# Patient Record
Sex: Male | Born: 1935 | Race: White | Hispanic: No | Smoking: Former smoker
Health system: Southern US, Community
[De-identification: ages and names within clinical notes are randomized; demographics above are authoritative.]

## PROBLEM LIST (undated history)

## (undated) DIAGNOSIS — I639 Cerebral infarction, unspecified: Secondary | ICD-10-CM

## (undated) DIAGNOSIS — I2510 Atherosclerotic heart disease of native coronary artery without angina pectoris: Secondary | ICD-10-CM

## (undated) DIAGNOSIS — F329 Major depressive disorder, single episode, unspecified: Secondary | ICD-10-CM

## (undated) DIAGNOSIS — F32A Depression, unspecified: Secondary | ICD-10-CM

## (undated) DIAGNOSIS — G20 Parkinson's disease: Secondary | ICD-10-CM

## (undated) HISTORY — PX: CORONARY ARTERY BYPASS GRAFT: SHX141

## (undated) HISTORY — DX: Depression, unspecified: F32.A

## (undated) HISTORY — PX: HERNIA REPAIR: SHX51

## (undated) HISTORY — DX: Cerebral infarction, unspecified: I63.9

## (undated) HISTORY — DX: Major depressive disorder, single episode, unspecified: F32.9

## (undated) HISTORY — PX: KNEE SURGERY: SHX244

## (undated) HISTORY — PX: CARPAL TUNNEL RELEASE: SHX101

---

## 1997-11-12 ENCOUNTER — Ambulatory Visit (HOSPITAL_COMMUNITY): Admission: RE | Admit: 1997-11-12 | Discharge: 1997-11-12 | Payer: Self-pay | Admitting: Interventional Cardiology

## 1997-11-18 ENCOUNTER — Inpatient Hospital Stay (HOSPITAL_COMMUNITY): Admission: RE | Admit: 1997-11-18 | Discharge: 1997-11-23 | Payer: Self-pay | Admitting: Cardiothoracic Surgery

## 1997-12-08 ENCOUNTER — Encounter (HOSPITAL_COMMUNITY): Admission: RE | Admit: 1997-12-08 | Discharge: 1998-03-08 | Payer: Self-pay | Admitting: Interventional Cardiology

## 1997-12-23 ENCOUNTER — Encounter: Admission: RE | Admit: 1997-12-23 | Discharge: 1998-03-23 | Payer: Self-pay | Admitting: Cardiothoracic Surgery

## 1998-08-04 ENCOUNTER — Encounter (HOSPITAL_COMMUNITY): Admission: RE | Admit: 1998-08-04 | Discharge: 1998-11-02 | Payer: Self-pay | Admitting: Interventional Cardiology

## 1999-03-22 ENCOUNTER — Ambulatory Visit (HOSPITAL_COMMUNITY): Admission: RE | Admit: 1999-03-22 | Discharge: 1999-03-22 | Payer: Self-pay | Admitting: *Deleted

## 1999-05-16 ENCOUNTER — Encounter (HOSPITAL_COMMUNITY): Admission: RE | Admit: 1999-05-16 | Discharge: 1999-08-14 | Payer: Self-pay | Admitting: Interventional Cardiology

## 2000-03-22 ENCOUNTER — Encounter: Payer: Self-pay | Admitting: Urology

## 2000-03-27 ENCOUNTER — Encounter (INDEPENDENT_AMBULATORY_CARE_PROVIDER_SITE_OTHER): Payer: Self-pay

## 2000-03-27 ENCOUNTER — Inpatient Hospital Stay (HOSPITAL_COMMUNITY): Admission: RE | Admit: 2000-03-27 | Discharge: 2000-03-28 | Payer: Self-pay | Admitting: Urology

## 2001-01-01 ENCOUNTER — Encounter (INDEPENDENT_AMBULATORY_CARE_PROVIDER_SITE_OTHER): Payer: Self-pay

## 2001-01-01 ENCOUNTER — Ambulatory Visit (HOSPITAL_COMMUNITY): Admission: RE | Admit: 2001-01-01 | Discharge: 2001-01-01 | Payer: Self-pay | Admitting: Gastroenterology

## 2001-02-28 HISTORY — PX: CIRCUMCISION: SUR203

## 2007-12-30 ENCOUNTER — Inpatient Hospital Stay (HOSPITAL_BASED_OUTPATIENT_CLINIC_OR_DEPARTMENT_OTHER): Admission: RE | Admit: 2007-12-30 | Discharge: 2007-12-30 | Payer: Self-pay | Admitting: Interventional Cardiology

## 2009-09-09 ENCOUNTER — Ambulatory Visit (HOSPITAL_COMMUNITY): Admission: RE | Admit: 2009-09-09 | Discharge: 2009-09-10 | Payer: Self-pay | Admitting: General Surgery

## 2010-10-20 LAB — BASIC METABOLIC PANEL
BUN: 11 mg/dL (ref 6–23)
CO2: 28 mEq/L (ref 19–32)
Calcium: 10.3 mg/dL (ref 8.4–10.5)
Chloride: 102 mEq/L (ref 96–112)
Creatinine, Ser: 1 mg/dL (ref 0.4–1.5)
GFR calc Af Amer: >60 mL/min (ref >60)
GFR calc non Af Amer: >60 mL/min (ref >60)
Glucose, Bld: 96 mg/dL (ref 70–99)
Potassium: 4.7 mEq/L (ref 3.5–5.1)
Sodium: 136 mEq/L (ref 135–145)

## 2010-10-20 LAB — DIFFERENTIAL
Basophils Absolute: 0 10*3/uL (ref 0.0–0.1)
Basophils Relative: 1 % (ref 0–1)
Eosinophils Absolute: 0.1 10*3/uL (ref 0.0–0.7)
Eosinophils Relative: 2 % (ref 0–5)
Lymphocytes Relative: 27 % (ref 12–46)
Lymphs Abs: 1.9 10*3/uL (ref 0.7–4.0)
Monocytes Absolute: 0.8 10*3/uL (ref 0.1–1.0)
Monocytes Relative: 11 % (ref 3–12)
Neutro Abs: 4.2 10*3/uL (ref 1.7–7.7)
Neutrophils Relative %: 60 % (ref 43–77)

## 2010-10-20 LAB — CBC
HCT: 43.5 % (ref 39.0–52.0)
Hemoglobin: 15 g/dL (ref 13.0–17.0)
MCHC: 34.4 g/dL (ref 30.0–36.0)
MCV: 93.5 fL (ref 78.0–100.0)
Platelets: 207 10*3/uL (ref 150–400)
RBC: 4.65 MIL/uL (ref 4.22–5.81)
RDW: 12.6 % (ref 11.5–15.5)
WBC: 7 10*3/uL (ref 4.0–10.5)

## 2010-12-13 NOTE — Cardiovascular Report (Signed)
Eric Oneill, Eric Oneill NO.:  [id number]   MEDICAL RECORD NO.:  [id number]          PATIENT TYPE:  OIB   LOCATION:  1961                         FACILITY:  [facility]   PHYSICIAN:  Lyn Records, M.D.   DATE OF BIRTH:  July 09, 1928   DATE OF PROCEDURE:  12/30/2007  DATE OF DISCHARGE:  12/30/2007                            CARDIAC CATHETERIZATION   INDICATIONS:  Eric Oneill has a history of coronary artery disease.  He  has been having recent episodes of chest tightness responsive to  nitroglycerin.  The study is being done to define coronary graft  patency.  The patient has a history of prior coronary grafting in 1999  x4 with a saphenous vein graft to the obtuse marginal, saphenous vein  graft to the first diagonal, saphenous vein graft to the second diagonal  and left internal mammary graft to the LAD.   PROCEDURE PERFORMED:  1. Left heart cath.  2. Selective coronary angio.  3. Left ventriculography.  4. Saphenous vein graft angiography.  5. Internal mammary artery grafting.   DESCRIPTION:  After informed consent, a 4-French sheath was placed in  the right femoral artery using modified Seldinger technique.  A 4-French  A2 multipurpose catheter was then used for hemodynamic recordings, left  ventriculography by hand injection, selective saphenous vein graft  angiography and native right coronary angiography.  A #5 4-French left  coronary catheter was used for left coronary angiography.  An internal  mammary artery catheter, 4-French was used for left internal mammary  graft angiography.  The patient tolerated the procedure without  complications.  Labetalol 10 mg IV given to help blood pressure control.   RESULTS:  1. Hemodynamic data:      a.     Aortic pressure 178/50.      b.     Left ventricular pressure 177/17.  2. Left ventriculography:  There is moderate mid anterior wall      hypokinesis.  Ejection fraction is 60%.  3. Coronary angiography.      a.      Left main coronary:  Widely patent.      b.     Left anterior descending coronary:  Highly diseased       throughout the proximal one half of the vessel with total       occlusion on the midvessel.  The first and second septal       perforators were supplied via the LAD through a 60-70%       obstruction.  The third septal perforator is threatened.  This was       relatively small and follows a 90% stenosis.  Beyond this, the       vessel appears to be occluded but may also be a region that is       supplied by the bypass graft that is producing such competitive       flow that it appears to be totally occluded.      c.     Circumflex artery:  Three small obtuse marginal branches       arise from  the proximal mid and distal vessel.  A large fourth and       fifth obtuse marginal arise distally.  The mid segment after the       second obtuse marginal contains 30% narrowing.  The distal       circumflex near the origin of the third obtuse marginal contains       an eccentric 60-70% lesion.  The fourth obtuse marginal is huge       and contains a region that is tented up from prior grafting.       There is 50-60% narrowing in this tented region.  The fifth obtuse       marginal is small but widely patent.      d.     Right coronary:  The right coronary artery is dominant,       heavily calcified and contains a shepherd's crook.  The vessel is       calcified within the shepherd's crook and in the midsegment.  No       significant obstruction is noted.  It gives origin to a large PDA       and moderate-sized left ventricular branches.  The vessel contains       no significant obstruction.  4. Bypass graft angiography.      a.     Saphenous vein graft to the first diagonal:  The graft is       small in caliber, contains proximal and mid irregularities and is       widely patent.      b.     Saphenous vein graft to the second diagonal:  This graft is       also widely patent with proximal one  third irregularities noted.      c.     Saphenous vein graft to the obtuse marginal #4:  Occluded at       the aorto-ostial junction.  5. Left internal mammary graft to the LAD:  This graft is widely      patent.   CONCLUSION:  1. The patient has severe coronary atherosclerosis with functional      occlusion of the mid-LAD and moderate disease in the circumflex.      There is mild-to-moderate right coronary artery disease.  2. Abnormal left ventricular function with mid anterior wall      hypokinesis.  EF is greater than 50%.  No mitral regurgitation.  3. 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  4. Patent saphenous vein graft to the diagonals as noted above and      also patent LIMA to the LAD.   RECOMMENDATIONS:  The patient's myocardial segments are relatively well  protected by patent grafts and/or native vessels that do not contain  high-grade obstruction.  The patient's angina seems as though it should  be manageable with medical therapy.  The one potential site of PCI that  could be helpful would be the distal circumflex, although  angiographically, this lesion does not appear critical.  This territory  beyond the native obstruction, however, does appear to be a region that  is ischemic on the Cardiolite.   PLAN:  We will discuss with the patient and family but pursue medical  therapy for the time being.  If symptoms are not controllable, consider  PCI of distal circumflex.      Lyn Records, M.D.  Electronically Signed  HWS/MEDQ  D:  12/30/2007  T:  12/30/2007  Job:  [id number]   cc:   Juluis Rainier, M.D.  [employer]

## 2010-12-16 NOTE — Procedures (Signed)
[facility]  Patient:    Eric Oneill, Eric Oneill                     MRN: [id number] Proc. Date: 01/01/01 Adm. Date:  [id number] Attending:  Nelda Marseille CC:         Al Decant. [name], M.D.   Procedure Report  PROCEDURE:  Colonoscopy with polypectomy.  ENDOSCOPIST:  Petra Kuba, M.D.  INDICATIONS:  Screening.  Consent was signed after risks, benefits, methods, and options were thoroughly discussed in the office.  MEDICINES USED:  Demerol 75, Versed 8.  DESCRIPTION OF PROCEDURE:  Rectal inspection was pertinent for external hemorrhoids.  Digital exam was negative.  Video colonoscope was inserted and fairly easily despite a lot of left-sided diverticulum.  Able to advance to the cecum.  This required some abdominal pressure but no position changes. The cecum was identified by the appendiceal orifice and the ileocecal valve. Prep was adequate.  There was some liquid stool that required washing and suctioning.  Other than the left-sided diverticula, no other obvious abnormality was seen on insertion.  The scope was slowly withdrawn.  The cecum and the ascending were normal.  In the transverse were small to medium size sessile polyps which were all hot biopsied x 1.  Two were hot biopsied x 1; one was hot biopsied x 2; and one was hot biopsied x 3.  There were all put in the same container.  The scope was slowly withdrawn.  Spots of diverticula were on the left side, but no other polypoid lesions, masses or other abnormalities.  The scope was slowly withdrawn back to the rectum where a small to tiny polyp was seen and hot biopsied x 1 and was put in with the other polyps.  The patient scope was then retroflexed pertinent for some internal hemorrhoids.   The scope was then straightened and readvanced a short ways up the sigmoid.  Air was suctioned and scope removed.  The patient tolerated the procedure well.  There was no obvious immediate  complication.  ENDOSCOPIC DIAGNOSIS: 1. Internal and external hemorrhoids. 2. Left significant diverticula. 3. One rectal tiny to small polyp and transverse small to medium polyps.  All    hot biopsied and put in the same container. 4. Otherwise within normal limits to the cecum.  PLAN:  Await pathology to determine future colonic screening.  GI followup p.r.n..  Ten-day post polypectomy instructions, otherwise return care to Dr. [name] for the customary health care maintenance to include yearly rectals and guaiacs. DD:  01/01/01 TD:  01/01/01 Job: [id number]

## 2010-12-16 NOTE — Discharge Summary (Signed)
[facility]  Patient:    Eric Oneill, Eric Oneill                     MRN: [id number] Adm. Date:  [id number] Disc. Date: [id number] Attending:  Londell Moh CC:         Al Decant. [name], M.D.  Celso Sickle, M.D.   Discharge Summary  ADMITTING DIAGNOSES: 1. Benign prostatic hypertrophy with bladder outlet obstruction. 2. Chronic balanitis.  SECONDARY DIAGNOSES: 1. Hypertension. 2. Coronary artery disease. 3. Hypercholesterolemia.  PRINCIPAL PROCEDURES: 1. Circumcision. 2. Transurethral resection of the prostate done on the day of admission.  HISTORY:  This 77 year old male has had problems with bladder obstruction despite the use of Flomax b.i.d.  The patients nocturia decreased from six times a night to three times a night, but he is not satisfied with his outlet symptoms.  The patient was given multiple options for therapy and decided to undergo a standard TURP.  He also requested a circumcision be done because of chronic balanitis with fusion of the penile skin to the bulbus of the penis.  PAST MEDICAL HISTORY: 1. Hypertension. 2. Coronary artery disease, status post CABG. 3. Elevated cholesterol.  MEDICATIONS ON ADMISSION: 1. Zocor 20 mg daily. 2. Flomax 0.4 b.i.d. 3. Lopressor 12.5 b.i.d. 4. Vitamin A. 5. Calcium. 6. Aspirin which was discontinued prior to surgery.  The patients past medical history, social history, review of systems, and physical examination are well described in his initial history and physical.  HOSPITAL COURSE:  The patient was taken to the operating room after adequate preparation and underwent successful TURP, and circumcision.  The patient had no postoperative problems.  He was ready for discharge on postoperative day #1.  His catheter was removed.  He urinated without difficulty.  He had no problems involving the circumcision.  He was sent home with a prescription for pain medication as well as  antibiotics.  FOLLOW-UP:  He will return for follow-up in two weeks time. DD:  04/04/00 TD:  04/04/00 Job: [id number]

## 2010-12-16 NOTE — H&P (Signed)
[facility]  Patient:    Eric Oneill, Eric Oneill                       MRN: [id number] Adm. Date:  03/27/00 Attending:  Jamison Neighbor, M.D. CC:         Al Decant. [name], M.D.  Celso Sickle, M.D.   History and Physical  ADMITTING DIAGNOSES: 1. Chronic balanitis. 2. Benign prostatic hypertrophy with bladder outlet obstruction. 3. Hypertension. 4. Past history of coronary artery disease.  HISTORY OF PRESENT ILLNESS:  This is a 77 year old male who has had problems with bladder obstruction despite the use of two Flomax daily.  He says his nocturia has decreased from six times a night to three times a night, but the patient wants a more permanent solution.  He has been apprised of his treatment options.  He was told he could consider medical management with the addition of _____ or herbal therapies.  He also knows about minimally invasive procedures such as TUNA, _____, and the interstitial laser.  The patient has elected to undergo TURP.  In addition, he has requested a circumcision done because of problems with chronic balanitis that has not responded to Mycolog cream.  The patient understands the risks and benefits of the procedures and will be admitted following those procedures.  PAST MEDICAL HISTORY:  Remarkable for hypertension.  He has a history of coronary artery bypass graft.  He is also known to have elevated cholesterol.  MEDICATIONS:  Zocor 20 mg daily, Flomax 0.4 mg b.i.d., Lopressor 12.5 mg b.i.d., vitamin A, calcium, and aspirin.  The aspirin has been discontinued.  PAST SURGICAL HISTORY:  Knee surgery (open and arthroscopy), left shoulder surgery, left inguinal hernia repair, right carpal tunnel surgery, CABG x 4.  SOCIAL HISTORY:  The patient drinks minimal amounts of alcohol.   He does not use tobacco.  REVIEW OF SYSTEMS:  Noncontributory.  Aside from hypertension, he has no coronary problems.  He denies pulmonary,  neurologic, hematologic, endocrine, musculoskeletal conditions.  As noted above, he does have current problems with balanitis.  He also has a past history of constipation and hemorrhoids.  PHYSICAL EXAMINATION:  GENERAL:  He is a well-developed, well-nourished male in no distress.  WEIGHT:  Current weight 235.  Temperature 96.9, pulse 70, respirations 16, blood pressure 130/84.  HEENT:  Normocephalic, atraumatic.  Cranial nerves II-XII are grossly intact.  NECK:  Supple with no adenopathy, thyromegaly, or bruits.  LUNGS:  Clear.  HEART:  Regular rate and rhythm without murmurs, thrills, gallops, rubs, or heaves.  ABDOMEN:  Soft, nontender, no palpable masses, rebound, or guarding.  RECTAL:  A 3-4+ benign-appearing prostate.  GENITOURINARY:  Normal testicles bilaterally.  There is no hydrocele, spermatocele, varicocele, hernia, or adenopathy.  The penis is remarkable for a somewhat adherent foreskin with chronic irritation which has improved somewhat with Mycolog.  EXTREMITIES:  No clubbing, cyanosis, or edema.  NEUROLOGIC:  Grossly intact.  IMPRESSION: 1. Balanitis. 2. Benign prostatic hypertrophy with bladder outlet obstruction.  PLAN:  Admit following circumcision and TURP. 1. Balanitis. DD:  03/27/00 TD:  03/27/00 Job: [id number]

## 2011-04-29 ENCOUNTER — Encounter: Payer: Self-pay | Admitting: Emergency Medicine

## 2011-04-29 ENCOUNTER — Emergency Department (HOSPITAL_BASED_OUTPATIENT_CLINIC_OR_DEPARTMENT_OTHER)
Admission: EM | Admit: 2011-04-29 | Discharge: 2011-04-29 | Disposition: A | Discharge status: Home / Self Care | Payer: Medicare Other | Attending: Emergency Medicine | Admitting: Emergency Medicine

## 2011-04-29 DIAGNOSIS — I2510 Atherosclerotic heart disease of native coronary artery without angina pectoris: Secondary | ICD-10-CM | POA: Insufficient documentation

## 2011-04-29 DIAGNOSIS — G20 Parkinson's disease: Secondary | ICD-10-CM | POA: Insufficient documentation

## 2011-04-29 DIAGNOSIS — G20A1 Parkinson's disease without dyskinesia, without mention of fluctuations: Secondary | ICD-10-CM | POA: Insufficient documentation

## 2011-04-29 DIAGNOSIS — H5789 Other specified disorders of eye and adnexa: Secondary | ICD-10-CM | POA: Insufficient documentation

## 2011-04-29 DIAGNOSIS — Z951 Presence of aortocoronary bypass graft: Secondary | ICD-10-CM | POA: Insufficient documentation

## 2011-04-29 DIAGNOSIS — T7840XA Allergy, unspecified, initial encounter: Secondary | ICD-10-CM

## 2011-04-29 DIAGNOSIS — Z79899 Other long term (current) drug therapy: Secondary | ICD-10-CM | POA: Insufficient documentation

## 2011-04-29 DIAGNOSIS — R600 Localized edema: Secondary | ICD-10-CM

## 2011-04-29 HISTORY — DX: Atherosclerotic heart disease of native coronary artery without angina pectoris: I25.10

## 2011-04-29 HISTORY — DX: Parkinson's disease: G20

## 2011-04-29 MED ORDER — HYDROXYZINE HCL 25 MG PO TABS: 25.0000 mg | ORAL_TABLET | Freq: Four times a day (QID) | ORAL | Status: AC

## 2011-04-29 MED ORDER — PREDNISONE 50 MG PO TABS: 50.0000 mg | ORAL_TABLET | Freq: Every day | ORAL | Status: AC

## 2011-04-29 NOTE — ED Provider Notes (Signed)
History     CSN: [id number] Arrival date & time: 04/29/2011  2:05 PM  Chief Complaint  Patient presents with  . Facial Swelling    facial swelling      (Consider location/radiation/quality/duration/timing/severity/associated sxs/prior treatment) HPI Comments: Patient presents with swelling below his left eye for the past 2 days. He also has an itchy rash to his bilateral hands that he thinks is poison ivy. Patient's daughter states she gave him some Benadryl cream for GI yesterday he woke up this morning the swelling was worse. He denies any change of his vision, headache, pain with eye movement, blurry vision or double vision. He denies any fever, shortness of breath or wheezing. Using over-the-counter poison ivy products on his hands as well as the back of his neck. He denies any chest pain, difficulty breathing, abdominal pain, vomiting or diarrhea. There's been no trauma to the eye or orbit.  The history is provided by the patient and a relative.    Past Medical History  Diagnosis Date  . Coronary artery disease   . Parkinson disease     Past Surgical History  Procedure Date  . Coronary artery bypass graft     History reviewed. No pertinent family history.  History  Substance Use Topics  . Smoking status: Never Smoker   . Smokeless tobacco: Not on file  . Alcohol Use: No      Review of Systems  Constitutional: Negative for activity change and appetite change.  HENT: Negative for congestion and rhinorrhea.   Eyes: Positive for itching. Negative for pain, discharge and visual disturbance.  Respiratory: Negative for cough, chest tightness, shortness of breath and wheezing.   Cardiovascular: Negative for chest pain.  Gastrointestinal: Negative for nausea, vomiting and abdominal pain.  Genitourinary: Negative for dysuria and hematuria.  Skin: Positive for rash.  Neurological: Negative for headaches.    Allergies  Ivp dye  Home Medications   Current Outpatient Rx   Name Route Sig Dispense Refill  . ACETAMINOPHEN 500 MG PO TABS Oral Take 1,000 mg by mouth once as needed. For pain     . ASPIRIN 81 MG PO TABS Oral Take 81 mg by mouth daily.      Marland Kitchen CARBIDOPA-LEVODOPA 50-200 MG PO TBCR Oral Take 1 tablet by mouth 3 (three) times daily.      Marland Kitchen CARVEDILOL 12.5 MG PO TABS Oral Take 12.5 mg by mouth 2 (two) times daily.     Marland Kitchen DIPHENHYDRAMINE-ZINC ACETATE 1-0.1 % EX CREA Topical Apply 1 application topically 2 (two) times daily as needed. For poison ivy     . OMEGA-3 FATTY ACIDS 1000 MG PO CAPS Oral Take 1 g by mouth daily.      . TECNU EXTREME MED POISON IVY EX GEL Apply externally Apply 1 application topically 2 (two) times daily as needed. For poison ivy     . LISINOPRIL 20 MG PO TABS Oral Take 20 mg by mouth daily.      . MULTI-VITAMIN/MINERALS PO TABS Oral Take 1 tablet by mouth daily.      Marland Kitchen SIMVASTATIN 40 MG PO TABS Oral Take 40 mg by mouth at bedtime.      Marland Kitchen HYDROXYZINE HCL 25 MG PO TABS Oral Take 1 tablet (25 mg total) by mouth every 6 (six) hours. 12 tablet 0  . PREDNISONE 50 MG PO TABS Oral Take 1 tablet (50 mg total) by mouth daily. 5 tablet 0    BP 148/58  Pulse 55  Temp(Src) 98  F (36.7 C) (Oral)  Resp 18  SpO2 100%  Physical Exam  Constitutional: He is oriented to person, place, and time. He appears well-developed and well-nourished. No distress.  HENT:  Head: Normocephalic and atraumatic.  Mouth/Throat: Oropharynx is clear and moist. No oropharyngeal exudate.  Eyes: Conjunctivae and EOM are normal. Pupils are equal, round, and reactive to light.       Swelling at the inferior orbital rim on the left. Nontender to palpation. Normal extraocular movements, normal conjunctiva, no scleral injection or discharge.  Neck: Normal range of motion. Neck supple.  Cardiovascular: Normal rate, regular rhythm and normal heart sounds.   Pulmonary/Chest: Effort normal and breath sounds normal. No respiratory distress.  Abdominal: Soft. There is no  tenderness. There is no rebound and no guarding.  Musculoskeletal: Normal range of motion. He exhibits no edema and no tenderness.  Neurological: He is alert and oriented to person, place, and time. No cranial nerve deficit.  Skin: Skin is warm. Rash noted.        Erythematous vesicular rash to left thenar eminence consistent with poison ivy    ED Course  Procedures (including critical care time)  Labs Reviewed - No data to display No results found.   1. Periorbital edema   2. Allergic reaction       MDM  Periorbital swelling without trauma. This appears to be consistent with allergic reaction. Patient likely was exposed to poison ivy and then touched his face. He has no visual change or systemic symptoms. He has no pain or other suggestion of trauma. We'll treat his symptoms with steroids and antihistamines.  He will need to follow up with his doctor this week. Patient and daughter express understanding.      Glynn Octave, MD 04/29/11 [phone]

## 2011-04-29 NOTE — ED Notes (Signed)
Pt presents with swelling to L side of Orbit denies any trauma

## 2012-08-05 ENCOUNTER — Ambulatory Visit: Payer: Medicare Other | Attending: Family Medicine

## 2012-08-05 DIAGNOSIS — M6281 Muscle weakness (generalized): Secondary | ICD-10-CM | POA: Insufficient documentation

## 2012-08-05 DIAGNOSIS — M2569 Stiffness of other specified joint, not elsewhere classified: Secondary | ICD-10-CM | POA: Insufficient documentation

## 2012-08-05 DIAGNOSIS — M79609 Pain in unspecified limb: Secondary | ICD-10-CM | POA: Insufficient documentation

## 2012-08-05 DIAGNOSIS — G20 Parkinson's disease: Secondary | ICD-10-CM | POA: Insufficient documentation

## 2012-08-05 DIAGNOSIS — IMO0001 Reserved for inherently not codable concepts without codable children: Secondary | ICD-10-CM | POA: Insufficient documentation

## 2012-08-05 DIAGNOSIS — M545 Low back pain, unspecified: Secondary | ICD-10-CM | POA: Insufficient documentation

## 2012-08-05 DIAGNOSIS — R262 Difficulty in walking, not elsewhere classified: Secondary | ICD-10-CM | POA: Insufficient documentation

## 2012-08-05 DIAGNOSIS — G20A1 Parkinson's disease without dyskinesia, without mention of fluctuations: Secondary | ICD-10-CM | POA: Insufficient documentation

## 2012-08-08 ENCOUNTER — Ambulatory Visit: Payer: Medicare Other

## 2012-08-12 ENCOUNTER — Ambulatory Visit: Payer: Medicare Other

## 2012-08-19 ENCOUNTER — Ambulatory Visit: Payer: Medicare Other | Admitting: Physical Therapy

## 2012-08-26 ENCOUNTER — Ambulatory Visit: Payer: Medicare Other | Admitting: Physical Therapy

## 2012-09-02 ENCOUNTER — Ambulatory Visit: Payer: Medicare Other | Admitting: Physical Therapy

## 2012-09-03 ENCOUNTER — Ambulatory Visit: Payer: Medicare Other | Admitting: Physical Therapy

## 2012-09-10 ENCOUNTER — Ambulatory Visit: Payer: Medicare Other | Attending: Family Medicine

## 2012-09-10 DIAGNOSIS — M545 Low back pain, unspecified: Secondary | ICD-10-CM | POA: Insufficient documentation

## 2012-09-10 DIAGNOSIS — R262 Difficulty in walking, not elsewhere classified: Secondary | ICD-10-CM | POA: Insufficient documentation

## 2012-09-10 DIAGNOSIS — M25569 Pain in unspecified knee: Secondary | ICD-10-CM | POA: Insufficient documentation

## 2012-09-10 DIAGNOSIS — M6281 Muscle weakness (generalized): Secondary | ICD-10-CM | POA: Insufficient documentation

## 2012-09-10 DIAGNOSIS — IMO0001 Reserved for inherently not codable concepts without codable children: Secondary | ICD-10-CM | POA: Insufficient documentation

## 2012-09-17 ENCOUNTER — Ambulatory Visit: Payer: Medicare Other | Admitting: Physical Therapy

## 2012-09-24 ENCOUNTER — Ambulatory Visit: Payer: Medicare Other | Admitting: Physical Therapy

## 2012-10-01 ENCOUNTER — Ambulatory Visit: Payer: Medicare Other | Attending: Family Medicine | Admitting: Physical Therapy

## 2012-10-01 DIAGNOSIS — M6281 Muscle weakness (generalized): Secondary | ICD-10-CM | POA: Insufficient documentation

## 2012-10-01 DIAGNOSIS — IMO0001 Reserved for inherently not codable concepts without codable children: Secondary | ICD-10-CM | POA: Insufficient documentation

## 2012-10-01 DIAGNOSIS — M545 Low back pain, unspecified: Secondary | ICD-10-CM | POA: Insufficient documentation

## 2012-10-01 DIAGNOSIS — R262 Difficulty in walking, not elsewhere classified: Secondary | ICD-10-CM | POA: Insufficient documentation

## 2012-10-01 DIAGNOSIS — M25569 Pain in unspecified knee: Secondary | ICD-10-CM | POA: Insufficient documentation

## 2012-10-15 ENCOUNTER — Ambulatory Visit: Payer: Medicare Other | Admitting: Physical Therapy

## 2012-12-02 ENCOUNTER — Ambulatory Visit: Payer: Medicare Other | Admitting: Physical Therapy

## 2012-12-03 ENCOUNTER — Ambulatory Visit: Payer: Medicare Other | Admitting: Physical Therapy

## 2012-12-05 ENCOUNTER — Telehealth: Payer: Self-pay | Admitting: Neurology

## 2012-12-05 MED ORDER — CARBIDOPA-LEVODOPA 25-100 MG PO TABS: 1.0000 | ORAL_TABLET | Freq: Three times a day (TID) | ORAL | Status: DC

## 2012-12-05 NOTE — Telephone Encounter (Signed)
Patient left message that he has been out of his Carbidopa-Levodopa 25-100 for about a week, he ran out.  But also he said the the pharmacy messed up and he hasn't received it by mail.  He wanted to know if we could arrange to have a week or two sent to the [facility] on [address].  He asked to be called back about this at [phone].  He has an appointment with Eber Jones on 01-13-13.

## 2012-12-09 ENCOUNTER — Telehealth: Payer: Self-pay | Admitting: Neurology

## 2012-12-09 ENCOUNTER — Ambulatory Visit: Payer: Medicare Other | Admitting: Occupational Therapy

## 2012-12-09 ENCOUNTER — Ambulatory Visit: Payer: Medicare Other | Attending: Family Medicine

## 2012-12-09 ENCOUNTER — Telehealth: Payer: Self-pay | Admitting: Nurse Practitioner

## 2012-12-09 ENCOUNTER — Ambulatory Visit: Payer: Medicare Other | Admitting: Physical Therapy

## 2012-12-09 DIAGNOSIS — IMO0001 Reserved for inherently not codable concepts without codable children: Secondary | ICD-10-CM | POA: Insufficient documentation

## 2012-12-09 DIAGNOSIS — M25569 Pain in unspecified knee: Secondary | ICD-10-CM | POA: Insufficient documentation

## 2012-12-09 DIAGNOSIS — R262 Difficulty in walking, not elsewhere classified: Secondary | ICD-10-CM | POA: Insufficient documentation

## 2012-12-09 DIAGNOSIS — M545 Low back pain, unspecified: Secondary | ICD-10-CM | POA: Insufficient documentation

## 2012-12-09 DIAGNOSIS — M6281 Muscle weakness (generalized): Secondary | ICD-10-CM | POA: Insufficient documentation

## 2012-12-09 NOTE — Telephone Encounter (Signed)
This is something that will have to be addressed by either the nurse or provider, as I am unable to advise patient on dose changes in medication.  I am unsure if patient needs to taper dose since he was off of his meds for one week.  I checked with nurse and they would like the provider to address this.  Dr Terrace Arabia, Please advise.  Thank you.

## 2012-12-11 ENCOUNTER — Ambulatory Visit: Payer: Medicare Other | Admitting: Physical Therapy

## 2012-12-11 ENCOUNTER — Ambulatory Visit: Payer: Medicare Other | Admitting: Speech Pathology

## 2012-12-11 NOTE — Telephone Encounter (Signed)
I have called his daughter, he had one week with out sinemet, it is [facility] for him to start Sinemet 25/100 tid.

## 2012-12-16 ENCOUNTER — Ambulatory Visit: Payer: Medicare Other | Admitting: Occupational Therapy

## 2012-12-18 ENCOUNTER — Ambulatory Visit: Payer: Self-pay | Admitting: Nurse Practitioner

## 2012-12-19 ENCOUNTER — Ambulatory Visit: Payer: Medicare Other | Admitting: Occupational Therapy

## 2012-12-19 ENCOUNTER — Ambulatory Visit: Payer: Medicare Other | Admitting: Physical Therapy

## 2012-12-25 ENCOUNTER — Ambulatory Visit: Payer: Medicare Other | Admitting: Occupational Therapy

## 2012-12-25 ENCOUNTER — Other Ambulatory Visit (HOSPITAL_COMMUNITY): Payer: Self-pay | Admitting: Family Medicine

## 2012-12-25 ENCOUNTER — Ambulatory Visit: Payer: Medicare Other | Admitting: Speech Pathology

## 2012-12-25 DIAGNOSIS — R1310 Dysphagia, unspecified: Secondary | ICD-10-CM

## 2012-12-26 ENCOUNTER — Ambulatory Visit: Payer: Medicare Other

## 2012-12-30 ENCOUNTER — Ambulatory Visit: Payer: PRIVATE HEALTH INSURANCE | Admitting: Speech Pathology

## 2012-12-30 ENCOUNTER — Ambulatory Visit: Payer: PRIVATE HEALTH INSURANCE | Attending: Family Medicine | Admitting: Physical Therapy

## 2012-12-30 DIAGNOSIS — M25569 Pain in unspecified knee: Secondary | ICD-10-CM | POA: Insufficient documentation

## 2012-12-30 DIAGNOSIS — M545 Low back pain, unspecified: Secondary | ICD-10-CM | POA: Insufficient documentation

## 2012-12-30 DIAGNOSIS — R262 Difficulty in walking, not elsewhere classified: Secondary | ICD-10-CM | POA: Insufficient documentation

## 2012-12-30 DIAGNOSIS — M6281 Muscle weakness (generalized): Secondary | ICD-10-CM | POA: Insufficient documentation

## 2012-12-30 DIAGNOSIS — IMO0001 Reserved for inherently not codable concepts without codable children: Secondary | ICD-10-CM | POA: Insufficient documentation

## 2012-12-31 ENCOUNTER — Ambulatory Visit (HOSPITAL_COMMUNITY)
Admission: RE | Admit: 2012-12-31 | Discharge: 2012-12-31 | Disposition: A | Discharge status: Home / Self Care | Payer: PRIVATE HEALTH INSURANCE | Source: Ambulatory Visit | Attending: Family Medicine | Admitting: Family Medicine

## 2012-12-31 DIAGNOSIS — R1310 Dysphagia, unspecified: Secondary | ICD-10-CM

## 2012-12-31 DIAGNOSIS — K117 Disturbances of salivary secretion: Secondary | ICD-10-CM | POA: Insufficient documentation

## 2012-12-31 DIAGNOSIS — G20A1 Parkinson's disease without dyskinesia, without mention of fluctuations: Secondary | ICD-10-CM | POA: Insufficient documentation

## 2012-12-31 DIAGNOSIS — G20 Parkinson's disease: Secondary | ICD-10-CM | POA: Insufficient documentation

## 2012-12-31 DIAGNOSIS — R1312 Dysphagia, oropharyngeal phase: Secondary | ICD-10-CM | POA: Insufficient documentation

## 2012-12-31 NOTE — Procedures (Addendum)
Objective Swallowing Evaluation: Modified Barium Swallowing Study  Patient Details  Name: Eric Oneill MRN: [id number] Date of Birth: 01/02/1928  Today's Date: 12/31/2012 Time: 1101-1141 SLP Time Calculation (min): 40 min  Past Medical History:  Past Medical History  Diagnosis Date  . Coronary artery disease   . Parkinson disease    Past Surgical History:  Past Surgical History  Procedure Laterality Date  . Coronary artery bypass graft     HPI:  79 yo male referred by outpt SLP for MBS.  Pt PMH + for Parkinson's disease diagnosed in 07/2007- first detected 07/2006 per pt, CAD s/p CABG 1999, hernia surgery 97 and 2011.  Pt is referred secondary to occasional complaint of choking with intake over the last 8-10 months and vocal weakness x5-6 months.  Pt denies weight loss and pulmonary infections.  He reports he has never required heimlich manuever but did have to "hock up" meat in a restaurant approx 4 months ago.  Xerostomia is reported as well.       Assessment / Plan / Recommendation Clinical Impression  Dysphagia Diagnosis: Mild oral phase dysphagia;Mild pharyngeal phase dysphagia;Mild cervical esophageal phase dysphagia  Clinical impression: Mild oropharyngeal dysphagia present without aspiration of any consistency tested.  Swallow dysfunction characterized by decreased strength in oral transiting and decreased pharyngeal musculature contraction resulting in pharyngeal stasis.  Reflexive swallows aid clearance of residuals and pt admits to conducting this strategy for approx 6-8 months.   Trace laryngeal penetration of thin noted x1 of approx 10 boluses, pt did not penetrate or aspirate when tested with sequential bolus consumption of thins.  Rec pt continue his diet with strict aspiration precautions and initiate exercises to maximize his swallow potential.  Of note, when SLP inquired if pt was sitting upright and taking small bites/sips when his coughing occurs, he admitted likely  not.  Skilled SLP intervention included advising pt and son-in-law to compensatory strategies, diet recommendations and need for family to know heimlich manuever for emergent use.   Suspect pt's coughing with intake is due to episodic aspiration based on today's MBS and SLP encouraged strict use of strategies to mitigate risk.      Pt denies having weight loss nor pulmonary infections, therefore appears to be tolerating diet currently, however if he should become acutely ill and functional reserve is compromised, tolerance may be impacted.      Treatment Recommendation  Defer treatment plan to SLP at (Comment)    Diet Recommendation Dysphagia 3 (Mechanical Soft), Ground meat with extra gravy and sauces;Thin liquid   Liquid Administration via: Cup Medication Administration: Whole meds with puree (start with liquid, follow with liquid) Supervision: Patient able to self feed Compensations: Slow rate;Small sips/bites;Follow solids with liquid;Multiple dry swallows after each bite/sip Postural Changes and/or Swallow Maneuvers: Seated upright 90 degrees;Upright 30-60 min after meal, Start meal with liquids due to xerostomia    Other  Recommendations Oral Care Recommendations: Oral care BID   Follow Up Recommendations  Outpatient SLP           SLP Swallow Goals     General HPI: 79 yo male referred by outpt SLP for MBS.  Pt PMH + for Parkinson's disease diagnosed in 07/2007- first detected 07/2006 per pt, CAD s/p CABG 1999, hernia surgery 97 and 2011.  Pt is referred secondary to occasional complaint of choking with intake over the last 8-10 months and vocal weakness x5-6 months.  Pt denies weight loss and pulmonary infections.  He reports he has  never required heimlich manuever but did have to "hock up" meat in a restaurant approx 4 months ago.  Xerostomia is reported as well.   Type of Study: Modified Barium Swallowing Study Reason for Referral: Objectively evaluate swallowing function;Other  (comment) (concern pt may be aspirating) Previous Swallow Assessment: no prior instrumental eval per pt Diet Prior to this Study: Regular;Thin liquids Temperature Spikes Noted: No Respiratory Status: Room air History of Recent Intubation: No Behavior/Cognition: Alert;Cooperative;Pleasant mood Oral Cavity - Dentition: Adequate natural dentition Oral Motor / Sensory Function: Impaired motor Oral impairment: Left lingual (slight deviation left upon protrusion) Self-Feeding Abilities: Able to feed self Patient Positioning: Upright in chair Baseline Vocal Quality: Low vocal intensity;Clear Volitional Cough: Strong Volitional Swallow: Able to elicit Anatomy: Within functional limits Pharyngeal Secretions: Not observed secondary MBS    Reason for Referral Objectively evaluate swallowing function;Other (comment) (concern pt may be aspirating)   Oral Phase Oral Preparation/Oral Phase Oral Phase: Impaired Oral - Nectar Oral - Nectar Cup: Delayed oral transit;Reduced posterior propulsion Oral - Thin Oral - Thin Cup: Delayed oral transit;Reduced posterior propulsion Oral - Thin Straw: Delayed oral transit;Reduced posterior propulsion Oral - Solids Oral - Puree: Reduced posterior propulsion;Delayed oral transit Oral - Regular: Delayed oral transit;Reduced posterior propulsion Oral - Pill: Within functional limits (with pudding) Oral Phase - Comment Oral Phase - Comment: mild amount of lingual pumping observed   Pharyngeal Phase Pharyngeal Phase Pharyngeal Phase: Impaired Pharyngeal - Nectar Pharyngeal - Nectar Cup: Reduced airway/laryngeal closure;Reduced laryngeal elevation;Reduced epiglottic inversion;Reduced pharyngeal peristalsis;Pharyngeal residue - valleculae;Pharyngeal residue - pyriform sinuses;Reduced tongue base retraction Pharyngeal - Thin Pharyngeal - Thin Cup: Reduced airway/laryngeal closure;Reduced laryngeal elevation;Reduced epiglottic inversion;Reduced pharyngeal  peristalsis;Reduced tongue base retraction;Penetration/Aspiration during swallow Penetration/Aspiration details (thin cup): Material enters airway, remains ABOVE vocal cords and not ejected out Pharyngeal - Thin Straw: Reduced epiglottic inversion;Reduced laryngeal elevation;Reduced airway/laryngeal closure;Reduced tongue base retraction Penetration/Aspiration details (thin straw): Material does not enter airway Pharyngeal - Solids Pharyngeal - Puree: Premature spillage to valleculae;Pharyngeal residue - valleculae;Pharyngeal residue - pyriform sinuses;Reduced tongue base retraction;Reduced pharyngeal peristalsis;Reduced epiglottic inversion Pharyngeal - Regular: Premature spillage to valleculae;Pharyngeal residue - valleculae;Pharyngeal residue - pyriform sinuses;Reduced tongue base retraction;Reduced pharyngeal peristalsis;Reduced epiglottic inversion (chin tuck not helpful to decr stasis) Pharyngeal - Pill: Premature spillage to valleculae (taken with pudding) Pharyngeal Phase - Comment Pharyngeal Comment: pt with reflexive dry swallows to aid clearance of liquids, following solids with liquid helpful to decrease stasis  Cervical Esophageal Phase    GO    Cervical Esophageal Phase Cervical Esophageal Phase: Impaired Cervical Esophageal Phase - Nectar Nectar Cup: Reduced cricopharyngeal relaxation Cervical Esophageal Phase - Thin Thin Cup: Reduced cricopharyngeal relaxation Thin Straw: Reduced cricopharyngeal relaxation Cervical Esophageal Phase - Solids Puree: Reduced cricopharyngeal relaxation Regular: Reduced cricopharyngeal relaxation Pill: Reduced cricopharyngeal relaxation Cervical Esophageal Phase - Comment Cervical Esophageal Comment: Barium tablet given with pudding appeared to lodge at mid-esophagus without pt awareness, further boluses of thin and pudding did not clear tablet, providing pt with water aided clearance.  Pt did state at times he senses pills lodging in his  esophagus requiring him to drink more water to clear.     Functional Assessment Tool Used: mbs, slp clinical judgement Functional Limitations: Swallowing Swallow Current Status (Z6109): At least 20 percent but less than 40 percent impaired, limited or restricted Swallow Goal Status [phone]): At least 20 percent but less than 40 percent impaired, limited or restricted Swallow Discharge Status [phone]): At least 20 percent but less than 40 percent impaired,  limited or restricted    Donavan Burnet, MS [facility] SLP [phone]

## 2013-01-01 ENCOUNTER — Ambulatory Visit: Payer: PRIVATE HEALTH INSURANCE | Admitting: Speech Pathology

## 2013-01-01 ENCOUNTER — Ambulatory Visit: Payer: PRIVATE HEALTH INSURANCE | Admitting: Occupational Therapy

## 2013-01-01 ENCOUNTER — Ambulatory Visit: Payer: PRIVATE HEALTH INSURANCE | Admitting: Physical Therapy

## 2013-01-06 ENCOUNTER — Ambulatory Visit: Payer: PRIVATE HEALTH INSURANCE | Admitting: Physical Therapy

## 2013-01-06 ENCOUNTER — Ambulatory Visit: Payer: PRIVATE HEALTH INSURANCE | Admitting: Occupational Therapy

## 2013-01-07 ENCOUNTER — Ambulatory Visit: Payer: PRIVATE HEALTH INSURANCE

## 2013-01-09 ENCOUNTER — Ambulatory Visit: Payer: PRIVATE HEALTH INSURANCE

## 2013-01-09 ENCOUNTER — Ambulatory Visit: Payer: PRIVATE HEALTH INSURANCE | Admitting: Physical Therapy

## 2013-01-09 ENCOUNTER — Ambulatory Visit: Payer: PRIVATE HEALTH INSURANCE | Admitting: Occupational Therapy

## 2013-01-13 ENCOUNTER — Ambulatory Visit: Payer: PRIVATE HEALTH INSURANCE | Admitting: Physical Therapy

## 2013-01-13 ENCOUNTER — Ambulatory Visit: Payer: Self-pay | Admitting: Nurse Practitioner

## 2013-01-13 ENCOUNTER — Ambulatory Visit: Payer: PRIVATE HEALTH INSURANCE | Admitting: Speech Pathology

## 2013-01-13 ENCOUNTER — Ambulatory Visit: Payer: PRIVATE HEALTH INSURANCE | Admitting: Occupational Therapy

## 2013-01-15 ENCOUNTER — Ambulatory Visit: Payer: PRIVATE HEALTH INSURANCE | Admitting: Occupational Therapy

## 2013-01-15 ENCOUNTER — Ambulatory Visit: Payer: PRIVATE HEALTH INSURANCE | Admitting: Speech Pathology

## 2013-01-15 ENCOUNTER — Ambulatory Visit: Payer: PRIVATE HEALTH INSURANCE | Admitting: Physical Therapy

## 2013-01-20 ENCOUNTER — Ambulatory Visit: Payer: PRIVATE HEALTH INSURANCE | Admitting: Physical Therapy

## 2013-01-20 ENCOUNTER — Ambulatory Visit: Payer: PRIVATE HEALTH INSURANCE | Admitting: Speech Pathology

## 2013-01-22 ENCOUNTER — Ambulatory Visit: Payer: PRIVATE HEALTH INSURANCE | Admitting: Occupational Therapy

## 2013-01-22 ENCOUNTER — Ambulatory Visit: Payer: PRIVATE HEALTH INSURANCE | Admitting: Speech Pathology

## 2013-01-22 ENCOUNTER — Ambulatory Visit: Payer: PRIVATE HEALTH INSURANCE | Admitting: Physical Therapy

## 2013-01-27 ENCOUNTER — Ambulatory Visit: Payer: PRIVATE HEALTH INSURANCE | Admitting: Physical Therapy

## 2013-01-27 ENCOUNTER — Ambulatory Visit: Payer: PRIVATE HEALTH INSURANCE | Admitting: Occupational Therapy

## 2013-01-29 ENCOUNTER — Ambulatory Visit: Payer: Medicare Other | Admitting: Occupational Therapy

## 2013-01-29 ENCOUNTER — Ambulatory Visit: Payer: Medicare Other | Attending: Family Medicine | Admitting: Physical Therapy

## 2013-01-29 DIAGNOSIS — M25569 Pain in unspecified knee: Secondary | ICD-10-CM | POA: Insufficient documentation

## 2013-01-29 DIAGNOSIS — IMO0001 Reserved for inherently not codable concepts without codable children: Secondary | ICD-10-CM | POA: Insufficient documentation

## 2013-01-29 DIAGNOSIS — M545 Low back pain, unspecified: Secondary | ICD-10-CM | POA: Insufficient documentation

## 2013-01-29 DIAGNOSIS — M6281 Muscle weakness (generalized): Secondary | ICD-10-CM | POA: Insufficient documentation

## 2013-01-29 DIAGNOSIS — R262 Difficulty in walking, not elsewhere classified: Secondary | ICD-10-CM | POA: Insufficient documentation

## 2013-02-05 ENCOUNTER — Ambulatory Visit: Payer: Medicare Other | Admitting: Occupational Therapy

## 2013-02-05 ENCOUNTER — Ambulatory Visit: Payer: Medicare Other | Admitting: Physical Therapy

## 2013-02-06 ENCOUNTER — Ambulatory Visit: Payer: Medicare Other | Admitting: Occupational Therapy

## 2013-02-06 ENCOUNTER — Ambulatory Visit: Payer: Medicare Other | Admitting: Physical Therapy

## 2013-02-10 ENCOUNTER — Ambulatory Visit: Payer: Medicare Other | Admitting: Occupational Therapy

## 2013-02-12 ENCOUNTER — Ambulatory Visit: Payer: Medicare Other | Admitting: Occupational Therapy

## 2013-02-18 ENCOUNTER — Ambulatory Visit (INDEPENDENT_AMBULATORY_CARE_PROVIDER_SITE_OTHER): Payer: Medicare Other | Admitting: Neurology

## 2013-02-18 ENCOUNTER — Encounter: Payer: Self-pay | Admitting: Neurology

## 2013-02-18 VITALS — BP 110/62 | HR 68 | Temp 97.6°F | Resp 16 | Wt 188.0 lb

## 2013-02-18 DIAGNOSIS — R296 Repeated falls: Secondary | ICD-10-CM

## 2013-02-18 DIAGNOSIS — M545 Low back pain: Secondary | ICD-10-CM

## 2013-02-18 DIAGNOSIS — R42 Dizziness and giddiness: Secondary | ICD-10-CM

## 2013-02-18 DIAGNOSIS — R292 Abnormal reflex: Secondary | ICD-10-CM

## 2013-02-18 DIAGNOSIS — G20 Parkinson's disease: Secondary | ICD-10-CM

## 2013-02-18 DIAGNOSIS — R413 Other amnesia: Secondary | ICD-10-CM

## 2013-02-18 DIAGNOSIS — Z9181 History of falling: Secondary | ICD-10-CM

## 2013-02-18 MED ORDER — CARBIDOPA-LEVODOPA ER 50-200 MG PO TBCR: 1.0000 | EXTENDED_RELEASE_TABLET | Freq: Every day | ORAL | Status: DC

## 2013-02-18 MED ORDER — CARBIDOPA-LEVODOPA 25-100 MG PO TABS: 1.5000 | ORAL_TABLET | Freq: Three times a day (TID) | ORAL | Status: DC

## 2013-02-18 NOTE — Progress Notes (Signed)
Eric Oneill was seen today in the movement disorders clinic for neurologic consultation at the request of [name], MD.  The consultation is for the evaluation of Parkinson's disease.  The patient previously saw [name].  I last visit with [name] was in June, 2013.  It appears that the patient was seen by [name] on a yearly basis.  I have a copy of the June, 2013 note from [name] and I did review this.  The pt supplies his own hx.    The first symptom(s) the patient noticed was tremor in the L hand.  He was dx with PD in 12/27/2006.    He was not initally placed on medication, but once he started on medication, it was carbidopa/levodopa.  He was initially on the 50/200 CR but that has been changed and he is now on the 25/100 IR three times per day before meals.  He is on selegeline once per day and does not know if it helps.  It appears from notes that he was on it twice per day in the past.    Specific Symptoms:  Tremor: yes (L hand initially, now R hand x 1 year) Voice: hypophonic, slurred Sleep: sleeps well  Vivid Dreams:  no  Acting out dreams:  no  Cramping in legs at night:  yes Wet Pillows: yes (occasionally x 6-8 months) Postural symptoms:  yes  Falls?  yes (single fall in 2011-12-27, outside walking dog at 10:30 at night and tripped and could not get up for 1 hour and crawled to house) Bradykinesia symptoms: difficulty getting out of a chair and difficulty regaining balance Loss of smell:  no Loss of taste:  no, but very dry mouth Urinary Incontinence:  yes, but mild (urinary urgency) Difficulty Swallowing:  yes  (mild, had swallow study) Handwriting, micrographia: yes Trouble with ADL's:  yes (sits to put on pants)  Trouble buttoning clothing: yes Depression:  yes (but denies now and antidepressant works.  probs since wife died in 2008/12/26, and tx since 2010-12-27) Memory changes:  yes (word finding trouble.  No driving x 1 year; lives by self, cooks for self with no prob, does  house cleaning) Hallucinations:  no  visual distortions: yes N/V:  no Lightheaded:  yes (while in rehab recently, took BP and it was 80's/50's.  He went to UC and lisinopril was decreased from 20 mg daily to 5 mg daily.  He felt great for a week, but then began to have similar symptoms.  He ultimately went back up to 10 mg just yesterday.)  Syncope: no Diplopia:  yes (only at night when laying in bed watching TV) Dyskinesia:  yes Feels weak.  Just finished neurorehab center and helped and plans to transition to exercise program.    Neuroimaging has previously been performed in 27-Dec-2007.  It is not available fo rme to review.  PREVIOUS MEDICATIONS:  Azilect (no help); carbidopa/levodopa extended release in the past but was discontinued in favor of the IR; selegiline  ALLERGIES:   Allergies  Allergen Reactions  . Ivp Dye (Iodinated Diagnostic Agents) Other (See Comments)    Dizziness     CURRENT MEDICATIONS:    Medication List       This list is accurate as of: 02/18/13 10:16 AM.  Always use your most recent med list.               acetaminophen 500 MG tablet  Commonly known as:  TYLENOL  Take  1,000 mg by mouth once as needed. For pain     aspirin 81 MG tablet  Take 81 mg by mouth daily.     carbidopa-levodopa 25-100 MG per tablet  Commonly known as:  SINEMET IR  Take 1.5 tablets by mouth 3 (three) times daily.     carbidopa-levodopa 50-200 MG per tablet  Commonly known as:  SINEMET CR  Take 1 tablet by mouth at bedtime.     diphenhydrAMINE-zinc acetate cream  Commonly known as:  BENADRYL  Apply 1 application topically 2 (two) times daily as needed. For poison ivy     escitalopram 10 MG tablet  Commonly known as:  LEXAPRO  Take 10 mg by mouth daily.     fish oil-omega-3 fatty acids 1000 MG capsule  Take 1 g by mouth daily.     lisinopril 10 MG tablet  Commonly known as:  PRINIVIL,ZESTRIL  Take 10 mg by mouth daily.     multivitamin with minerals tablet  Take 1  tablet by mouth daily.     selegiline 5 MG tablet  Commonly known as:  ELDEPRYL  Take 5 mg by mouth daily with breakfast.     simvastatin 40 MG tablet  Commonly known as:  ZOCOR  Take 40 mg by mouth at bedtime.     Tecnu Extreme Med Poison Ivy Gel  Apply 1 application topically 2 (two) times daily as needed. For poison ivy        PAST MEDICAL HISTORY:   Past Medical History  Diagnosis Date  . Coronary artery disease   . Parkinson disease     dx: 06/2007  . Depression     PAST SURGICAL HISTORY:   Past Surgical History  Procedure Laterality Date  . Coronary artery bypass graft    . Hernia repair      x2  . Circumcision  Aug 2002  . Carpal tunnel release      L  . Knee surgery      bilateral    SOCIAL HISTORY:   History   Social History  . Marital Status: Widowed    Spouse Name: N/A    Number of Children: N/A  . Years of Education: N/A   Occupational History  . retired     [employer]   Social History Main Topics  . Smoking status: Former Smoker    Quit date: 02/18/1958  . Smokeless tobacco: Never Used  . Alcohol Use: Yes     Comment: daily, glass wine daily  . Drug Use: No  . Sexually Active: Not on file   Other Topics Concern  . Not on file   Social History Narrative  . No narrative on file    FAMILY HISTORY:   Family Status  Relation Status Death Age  . Mother Deceased 23    heart ds  . Father Deceased 1    CVA  . Brother      2, multiple myeloma; heart disease  . Sister      3, CAD, Breast CA    ROS: Admits to chronic low back pain.   A complete 10 system review of systems was obtained and was unremarkable apart from what is mentioned above.  PHYSICAL EXAMINATION:    VITALS:   Filed Vitals:   02/18/13 0849  BP: 110/62  Pulse: 68  Temp: 97.6 F (36.4 C)  Resp: 16  Weight: 188 lb (85.276 kg)    GEN:  The patient appears stated age and  is in NAD. HEENT:  Normocephalic, atraumatic.  The mucous membranes are very dry. The  superficial temporal arteries are without ropiness or tenderness. CV:  RRR Lungs:  CTAB Neck/HEME:  There are no carotid bruits bilaterally.  Neurological examination:  Orientation: The patient is alert and oriented x3. Fund of knowledge is appropriate.  Recent and remote memory are intact.  Attention and concentration are normal.    Able to name objects and repeat phrases. Cranial nerves: There is good facial symmetry.  There is significant facial hypomimia.  Pupils are equal round and reactive to light bilaterally. Fundoscopic exam is attempted but the disc margins are not well visualized bilaterally.  Extraocular muscles are intact.  There is vertical, upbeat nystagmus.  The visual fields are full to confrontational testing. The speech is fluent and clear.  He is hypophonic. Soft palate rises symmetrically and there is no tongue deviation. Hearing is intact to conversational tone. Sensation: Sensation is intact to light and pinprick throughout (facial, trunk, extremities). Vibration is intact at the bilateral big toe. There is no extinction with double simultaneous stimulation. There is no sensory dermatomal level identified. Motor: Strength is 5/5 in the bilateral upper and lower extremities.   Shoulder shrug is equal and symmetric.  There is no pronator drift. Deep tendon reflexes: Deep tendon reflexes are 1/4 at the bilateral biceps, triceps, brachioradialis, 3/4 at the bilateral patella with cross adductor reflex and 1/4 at the bilateral achilles. Plantar response is upgoing on the right and neutral on the left.   Movement examination: Tone: There is increased tone in the bilateral upper extremities, left greater than right.  The tone in the lower extremities is normal.  Abnormal movements: There is a left upper extremity resting tremor. Coordination:  There is definite decremation with RAM's, Including alternating supination and pronation of the forearm, hand opening and closing, finger taps,  heel taps and toe taps bilaterally.  There are pauses. Gait and Station: The patient is unable to arise out of a deep-seated chair without the use of the hands. The patient's stride length is decreased, but his stance is very wide-based.  The patient has a positive pull test.      ASSESSMENT/PLAN:  1.  Parkinsonism.  I agree that this does represent idiopathic Parkinson's disease.  The patient has tremor, bradykinesia, rigidity and postural instability.  -We discussed the diagnosis as well as pathophysiology of the disease.  We discussed treatment options as well as prognostic indicators.  Patient education was provided.  -Greater than 50% of the 80 minute visit was spent in counseling answering questions and talking about what to expect now as well as in the future.  We talked about medication options as well as potential future surgical options.  We talked about safety in the home.  -He is under dose and we decided to increase his carbidopa/levodopa 25/100 to 1-1/2 tablets 3 times a day before meals.  We will add carbidopa/levodopa 50/200 CR at bedtime which will hopefully help the morning and at nighttime cramps.   Risks, benefits, side effects and alternative therapies were discussed.  The opportunity to ask questions was given and they were answered to the best of my ability.  The patient expressed understanding and willingness to follow the outlined treatment protocols.  -He just completed the Parkinson's program at the neurorehabilitation Center and I encouraged him to transition to an exercise program.  -I am going to go ahead and do an MRI of the brain given the fact he  does have vertical nystagmus and an upgoing toe on the right. 2.  Lightheadedness, likely representing orthostatic hypotension.  -His dosage of lisinopril was just decreased yesterday.  I asked him to keep a log of blood pressure for me.  He may need to decrease this further. 3.  low back pain with hyperreflexia.  -We will do  an MRI of the lumbar spine. 4.  I will plan on seeing him back in about 6 weeks.

## 2013-02-18 NOTE — Patient Instructions (Addendum)
1.  Increase the carbidopa/levodopa 25/100 to 1.5 tablets before meals 2.  When you get the carbidopa/levodopa 50/200 in the mail, you will take one tablet at night 3.  Exercise 4.  We will schedule your brain and low back MRI.   Your MRI is scheduled for this Friday, July 25th at 3:00pm.  Please arrive to [facility], first floor admitting by 2:45pm.  [phone]. [employer], Entrance A) 5.  I will see you back around the first week in September 6.  Great meeting you!

## 2013-02-21 ENCOUNTER — Ambulatory Visit (HOSPITAL_COMMUNITY)
Admission: RE | Admit: 2013-02-21 | Discharge: 2013-02-21 | Disposition: A | Discharge status: Home / Self Care | Payer: Medicare Other | Source: Ambulatory Visit | Attending: Neurology | Admitting: Neurology

## 2013-02-21 DIAGNOSIS — Z9181 History of falling: Secondary | ICD-10-CM | POA: Insufficient documentation

## 2013-02-21 DIAGNOSIS — R413 Other amnesia: Secondary | ICD-10-CM | POA: Insufficient documentation

## 2013-02-21 DIAGNOSIS — M48061 Spinal stenosis, lumbar region without neurogenic claudication: Secondary | ICD-10-CM | POA: Insufficient documentation

## 2013-02-21 DIAGNOSIS — R296 Repeated falls: Secondary | ICD-10-CM

## 2013-02-21 DIAGNOSIS — G20 Parkinson's disease: Secondary | ICD-10-CM

## 2013-02-21 DIAGNOSIS — R292 Abnormal reflex: Secondary | ICD-10-CM

## 2013-02-21 DIAGNOSIS — M545 Low back pain, unspecified: Secondary | ICD-10-CM | POA: Insufficient documentation

## 2013-04-07 ENCOUNTER — Ambulatory Visit (INDEPENDENT_AMBULATORY_CARE_PROVIDER_SITE_OTHER): Payer: Medicare Other | Admitting: Neurology

## 2013-04-07 ENCOUNTER — Ambulatory Visit: Payer: Self-pay | Admitting: Nurse Practitioner

## 2013-04-07 VITALS — BP 110/60 | HR 60 | Temp 97.7°F | Resp 16 | Wt 192.8 lb

## 2013-04-07 DIAGNOSIS — R42 Dizziness and giddiness: Secondary | ICD-10-CM

## 2013-04-07 DIAGNOSIS — M545 Low back pain, unspecified: Secondary | ICD-10-CM

## 2013-04-07 DIAGNOSIS — G20 Parkinson's disease: Secondary | ICD-10-CM

## 2013-04-07 DIAGNOSIS — IMO0002 Reserved for concepts with insufficient information to code with codable children: Secondary | ICD-10-CM

## 2013-04-07 DIAGNOSIS — M5416 Radiculopathy, lumbar region: Secondary | ICD-10-CM

## 2013-04-07 DIAGNOSIS — G20A1 Parkinson's disease without dyskinesia, without mention of fluctuations: Secondary | ICD-10-CM

## 2013-04-07 MED ORDER — CARBIDOPA-LEVODOPA 25-100 MG PO TABS: 1.5000 | ORAL_TABLET | Freq: Four times a day (QID) | ORAL | Status: DC

## 2013-04-07 NOTE — Patient Instructions (Addendum)
1.  Increase your selegeline - 5mg  - 1 tablet at 7am and 1 at noon 2.  Take your carbidopa/levodopa: 25/100, 1.5 tablets at the following times:  7am, 11am, 3 pm, 6-7pm 3.  Continue your carbidopa/levodopa 50/200 at bedtime 4.  I will refer you to someone who can see if you would benefit from injections into the back

## 2013-04-07 NOTE — Progress Notes (Signed)
Eric Oneill was seen today in the movement disorders clinic for neurologic consultation at the request of [name], MD.  The consultation is for the evaluation of Parkinson's disease.  The patient previously saw [name].  I last visit with [name] was in June, 2013.  It appears that the patient was seen by [name] on a yearly basis.  I have a copy of the June, 2013 note from [name] and I did review this.  The pt supplies his own hx.    The first symptom(s) the patient noticed was tremor in the L hand.  He was dx with PD in 2008.    He was not initally placed on medication, but once he started on medication, it was carbidopa/levodopa.  He was initially on the 50/200 CR but that has been changed and he is now on the 25/100 IR three times per day before meals.  He is on selegeline once per day and does not know if it helps.  It appears from notes that he was on it twice per day in the past.    04/07/13 update:  This patient is accompanied in the office by his child who supplements the history.  Last visit, I increased the patient's carbidopa/levodopa 25/100-1-1/2 tablets 3 times per day.  We also added carbidopa/levodopa 50/200 CR at bedtime.  He does think that the increase helped his overall symptoms, but notices that he seems to have more trouble, especially with tremor, between 12 PM and 4 PM.  He generally takes his medication at 7 AM, noon and 5 PM.  He has not fallen.  There are no hallucinations.  His daughter prepares his pill box and then he takes the medications.  About 5 days ago, he decided to try and stop the lisinopril as he did not think he needed it any longer.  He has been watching his blood pressure closely and it has remained good.  He has been having some cramping at night.  Some of the cramping is in the feet and some inappropriate longus muscle on the right.  He has had some difficulty falling asleep at night, but his daughter also reports that the patient takes two, 2  hour naps.  He also decreased his selegiline Sinemet he was only taking it one time per day, at 7 AM.  He continues to complain of back pain.  Since our last visit the patient did have an MRI of the brain.  There was dilated Virchow-Robins spaces in the right basal ganglia, but I think that there is potentially an old lacune in the left basal ganglia.  An MRI of the lumbar spine demonstrated moderate central canal stenosis at the L4-L5 level and bilateral neural foraminal stenosis at the L5-S1 level.     PREVIOUS MEDICATIONS:  Azilect (no help); carbidopa/levodopa extended release in the past but was discontinued in favor of the IR; selegiline  ALLERGIES:   Allergies  Allergen Reactions  . Ivp Dye [Iodinated Diagnostic Agents] Other (See Comments)    Dizziness     CURRENT MEDICATIONS:    Medication List       This list is accurate as of: 04/07/13  3:04 PM.  Always use your most recent med list.               acetaminophen 500 MG tablet  Commonly known as:  TYLENOL  Take 1,000 mg by mouth once as needed. For pain     aspirin 81 MG  tablet  Take 81 mg by mouth daily.     carbidopa-levodopa 25-100 MG per tablet  Commonly known as:  SINEMET IR  Take 1.5 tablets by mouth 3 (three) times daily.     carbidopa-levodopa 50-200 MG per tablet  Commonly known as:  SINEMET CR  Take 1 tablet by mouth at bedtime.     diphenhydrAMINE-zinc acetate cream  Commonly known as:  BENADRYL  Apply 1 application topically 2 (two) times daily as needed. For poison ivy     escitalopram 10 MG tablet  Commonly known as:  LEXAPRO  Take 10 mg by mouth daily.     fish oil-omega-3 fatty acids 1000 MG capsule  Take 1 g by mouth daily.     lisinopril 10 MG tablet  Commonly known as:  PRINIVIL,ZESTRIL  Take 10 mg by mouth daily.     multivitamin with minerals tablet  Take 1 tablet by mouth daily.     selegiline 5 MG tablet  Commonly known as:  ELDEPRYL  Take 5 mg by mouth daily with breakfast.      simvastatin 40 MG tablet  Commonly known as:  ZOCOR  Take 40 mg by mouth at bedtime.     [employer] Med Poison Ivy Gel  Apply 1 application topically 2 (two) times daily as needed. For poison ivy        PAST MEDICAL HISTORY:   Past Medical History  Diagnosis Date  . Coronary artery disease   . Parkinson disease     dx: 06/2007  . Depression     PAST SURGICAL HISTORY:   Past Surgical History  Procedure Laterality Date  . Coronary artery bypass graft    . Hernia repair      x2  . Circumcision  Aug 2002  . Carpal tunnel release      L  . Knee surgery      bilateral    SOCIAL HISTORY:   History   Social History  . Marital Status: Widowed    Spouse Name: N/A    Number of Children: N/A  . Years of Education: N/A   Occupational History  . retired     [employer]   Social History Main Topics  . Smoking status: Former Smoker    Quit date: 02/18/1958  . Smokeless tobacco: Never Used  . Alcohol Use: Yes     Comment: daily, glass wine daily  . Drug Use: No  . Sexual Activity: Not on file   Other Topics Concern  . Not on file   Social History Narrative  . No narrative on file    FAMILY HISTORY:   Family Status  Relation Status Death Age  . Mother Deceased 3    heart ds  . Father Deceased 65    CVA  . Brother      2, multiple myeloma; heart disease  . Sister      3, CAD, Breast CA    ROS: Admits to chronic low back pain.   A complete 10 system review of systems was obtained and was unremarkable apart from what is mentioned above.  PHYSICAL EXAMINATION:    VITALS:   There were no vitals filed for this visit.  GEN:  The patient appears stated age and is in NAD. HEENT:  Normocephalic, atraumatic.  The mucous membranes are very dry. The superficial temporal arteries are without ropiness or tenderness. CV:  RRR Lungs:  CTAB Neck/HEME:  There are no carotid bruits bilaterally.  Neurological examination:  Orientation: The patient is alert and  oriented x3. Fund of knowledge is appropriate.  Recent and remote memory are intact.  Attention and concentration are normal.    Able to name objects and repeat phrases. Cranial nerves: There is good facial symmetry.  There is significant facial hypomimia.  Pupils are equal round and reactive to light bilaterally. Fundoscopic exam is attempted but the disc margins are not well visualized bilaterally.  Extraocular muscles are intact.  There is vertical, upbeat nystagmus.  The visual fields are full to confrontational testing. The speech is fluent and clear.  He is hypophonic. Soft palate rises symmetrically and there is no tongue deviation. Hearing is intact to conversational tone. Sensation: Sensation is intact to light and pinprick throughout (facial, trunk, extremities). Vibration is intact at the bilateral big toe. There is no extinction with double simultaneous stimulation. There is no sensory dermatomal level identified. Motor: Strength is 5/5 in the bilateral upper and lower extremities.   Shoulder shrug is equal and symmetric.  There is no pronator drift. Deep tendon reflexes: Deep tendon reflexes are 1/4 at the bilateral biceps, triceps, brachioradialis, 3/4 at the bilateral patella with cross adductor reflex and 1/4 at the bilateral achilles. Plantar response is upgoing on the right and neutral on the left.   Movement examination: Tone: There is increased tone in the bilateral upper extremities, left greater than right.  The tone in the lower extremities is normal.  Abnormal movements: There is a left upper extremity resting tremor. Coordination:  There is definite decremation with RAM's, Including alternating supination and pronation of the forearm, hand opening and closing, finger taps, heel taps and toe taps bilaterally.  There are pauses. Gait and Station: The patient is unable to arise out of a deep-seated chair without the use of the hands. The patient's stride length is decreased, but his  stance is very wide-based.    ASSESSMENT/PLAN:  1.  Parkinsonism.  I agree that this does represent idiopathic Parkinson's disease.  The patient has tremor, bradykinesia, rigidity and postural instability.  -We discussed the diagnosis as well as pathophysiology of the disease.  We discussed treatment options as well as prognostic indicators.  Patient education was provided.  -I am going to add in extra dosage of levodopa, so that he takes carbidopa/levodopa 25/100, 1-1/2 tablets at 7 AM, 11 AM, 3 PM and 6 to 7 PM.  He will continue the carbidopa/levodopa 50/200 CR at bedtime.  -He will increase the selegiline so he is taking it twice a day, with the last dosage being no later than noon.  I think that that may help the excessive daytime hypersomnolence.  -The cramping in the toes is likely from low DA, but not necessarily so in the peroneus longus.  I'm going to have tonic water, which contains small amount of quinine to see if that helps. 2.  Lightheadedness, likely representing orthostatic hypotension.  -He just d/c his lisinopril.  To him to make sure that his prescribing physician is aware.  His orthostatics were negative today in the office. 3.  low back pain with hyperreflexia.  -An MRI of the lumbar spine in 2014 demonstrated moderate central canal stenosis at L4-L5 and bilateral neural foraminal stenosis at L5-S1.  He would like to be referred for a possible epidural injection. 4.  I will plan on seeing him back in about 8 weeks.

## 2013-06-09 ENCOUNTER — Encounter: Payer: Self-pay | Admitting: Neurology

## 2013-06-09 ENCOUNTER — Ambulatory Visit (INDEPENDENT_AMBULATORY_CARE_PROVIDER_SITE_OTHER): Payer: Medicare Other | Admitting: Neurology

## 2013-06-09 VITALS — BP 98/56 | HR 60 | Temp 97.4°F | Resp 12 | Ht 69.0 in | Wt 198.3 lb

## 2013-06-09 DIAGNOSIS — G20 Parkinson's disease: Secondary | ICD-10-CM

## 2013-06-09 DIAGNOSIS — I951 Orthostatic hypotension: Secondary | ICD-10-CM

## 2013-06-09 MED ORDER — ESCITALOPRAM OXALATE 10 MG PO TABS: 10.0000 mg | ORAL_TABLET | Freq: Every day | ORAL | Status: DC

## 2013-06-09 NOTE — Patient Instructions (Signed)
1.  I want you to decrease your selegeline to one time in the morning for 2 weeks and then if you are doing okay, stop the medication all together 2.  Exercise! 3.  Continue your carbidopa/levodopa as previous 4.  I asked your primary care physician about your lisinopril

## 2013-06-09 NOTE — Progress Notes (Signed)
Eric Oneill was seen today in the movement disorders clinic for neurologic consultation at the request of Eric Heck, MD.  The consultation is for the evaluation of Parkinson's disease.  The patient previously saw Dr. [name].  I last visit with Dr. [name] was in June, 2013.  It appears that the patient was seen by Dr. [name] on a yearly basis.  I have a copy of the June, 2013 note from Dr. [name] and I did review this.  The pt supplies his own hx.    The first symptom(s) the patient noticed was tremor in the L hand.  He was dx with PD in 2008.    He was not initally placed on medication, but once he started on medication, it was carbidopa/levodopa.  He was initially on the 50/200 CR but that has been changed and he is now on the 25/100 IR three times per day before meals.  He is on selegeline once per day and does not know if it helps.  It appears from notes that he was on it twice per day in the past.    04/07/13 update:  This patient is accompanied in the office by his child who supplements the history.  Last visit, I increased the patient's carbidopa/levodopa 25/100-1-1/2 tablets 3 times per day.  We also added carbidopa/levodopa 50/200 CR at bedtime.  He does think that the increase helped his overall symptoms, but notices that he seems to have more trouble, especially with tremor, between 12 PM and 4 PM.  He generally takes his medication at 7 AM, noon and 5 PM.  He has not fallen.  There are no hallucinations.  His daughter prepares his pill box and then he takes the medications.  About 5 days ago, he decided to try and stop the lisinopril as he did not think he needed it any longer.  He has been watching his blood pressure closely and it has remained good.  He has been having some cramping at night.  Some of the cramping is in the feet and some inappropriate longus muscle on the right.  He has had some difficulty falling asleep at night, but his daughter also reports that the patient takes two, 2  hour naps.  He also decreased his selegiline so he was only taking it one time per day, at 7 AM.  He continues to complain of back pain.  Since our last visit the patient did have an MRI of the brain.  There was dilated Virchow-Robins spaces in the right basal ganglia, but I think that there is potentially an old lacune in the left basal ganglia.  An MRI of the lumbar spine demonstrated moderate central canal stenosis at the L4-L5 level and bilateral neural foraminal stenosis at the L5-S1 level.  06/09/13 update:  The patient is accompanied by his daughter who supplements the history.  The patient has Parkinson's disease.  He is currently on carbidopa/levodopa 25/100, 1-1/2 tablets at 7 AM/11 AM/3 PM/6 PM.  This is an increase of one additional dose since our last visit.  He is also on carbidopa/levodopa 50/200 at bedtime.  He increased his selegeline back to bid.  He overall feel good but continues to complain of dizziness.  Last visit, he had self discontinued the lisinopril, but he went back on it.  He had one fall since our last visit, that occurred in the yard while pulling weeds.  He did not get hurt.  He has not had any nausea or vomiting  or hallucinations.   PREVIOUS MEDICATIONS:  Azilect (no help); carbidopa/levodopa extended release in the past but was discontinued in favor of the IR; selegiline  ALLERGIES:   Allergies  Allergen Reactions  . Ivp Dye [Iodinated Diagnostic Agents] Other (See Comments)    Dizziness     CURRENT MEDICATIONS:    Medication List       This list is accurate as of: 06/09/13 10:09 AM.  Always use your most recent med list.               aspirin 81 MG tablet  Take 81 mg by mouth daily.     carbidopa-levodopa 50-200 MG per tablet  Commonly known as:  SINEMET CR  Take 1 tablet by mouth at bedtime.     carbidopa-levodopa 25-100 MG per tablet  Commonly known as:  SINEMET IR  Take 1.5 tablets by mouth 4 (four) times daily.     escitalopram 10 MG  tablet  Commonly known as:  LEXAPRO  Take 10 mg by mouth daily.     ibuprofen 200 MG tablet  Commonly known as:  ADVIL,MOTRIN  Take 200 mg by mouth every 6 (six) hours as needed for pain.     selegiline 5 MG tablet  Commonly known as:  ELDEPRYL  Take 5 mg by mouth daily with breakfast.     simvastatin 40 MG tablet  Commonly known as:  ZOCOR  Take 40 mg by mouth at bedtime.        PAST MEDICAL HISTORY:   Past Medical History  Diagnosis Date  . Coronary artery disease   . Parkinson disease     dx: 06/2007  . Depression     PAST SURGICAL HISTORY:   Past Surgical History  Procedure Laterality Date  . Coronary artery bypass graft    . Hernia repair      x2  . Circumcision  Aug 2002  . Carpal tunnel release      L  . Knee surgery      bilateral    SOCIAL HISTORY:   History   Social History  . Marital Status: Widowed    Spouse Name: N/A    Number of Children: N/A  . Years of Education: N/A   Occupational History  . retired     [employer]   Social History Main Topics  . Smoking status: Former Smoker    Quit date: 02/18/1958  . Smokeless tobacco: Never Used  . Alcohol Use: Yes     Comment: daily, glass wine daily  . Drug Use: No  . Sexual Activity: Not on file   Other Topics Concern  . Not on file   Social History Narrative  . No narrative on file    FAMILY HISTORY:   Family Status  Relation Status Death Age  . Mother Deceased 24    heart ds  . Father Deceased 2    CVA  . Brother      2, multiple myeloma; heart disease  . Sister      3, CAD, Breast CA    ROS: Admits to chronic low back pain.   A complete 10 system review of systems was obtained and was unremarkable apart from what is mentioned above.  PHYSICAL EXAMINATION:    VITALS:   There were no vitals filed for this visit.  GEN:  The patient appears stated age and is in NAD. HEENT:  Normocephalic, atraumatic.  The mucous membranes are very dry. The superficial  temporal arteries are  without ropiness or tenderness. CV:  RRR Lungs:  CTAB Neck/HEME:  There are no carotid bruits bilaterally.  Neurological examination:  Orientation: The patient is alert and oriented x3. Fund of knowledge is appropriate.  Recent and remote memory are intact.  Attention and concentration are normal.    Able to name objects and repeat phrases. Cranial nerves: There is good facial symmetry.  There is significant facial hypomimia.  Pupils are equal round and reactive to light bilaterally. Fundoscopic exam is attempted but the disc margins are not well visualized bilaterally.  Extraocular muscles are intact.  There is vertical, upbeat nystagmus.  The visual fields are full to confrontational testing. The speech is fluent and clear.  He is hypophonic. Soft palate rises symmetrically and there is no tongue deviation. Hearing is intact to conversational tone. Sensation: Sensation is intact to light and pinprick throughout (facial, trunk, extremities). Vibration is intact at the bilateral big toe. There is no extinction with double simultaneous stimulation. There is no sensory dermatomal level identified. Motor: Strength is 5/5 in the bilateral upper and lower extremities.   Shoulder shrug is equal and symmetric.  There is no pronator drift. Deep tendon reflexes: Deep tendon reflexes are 1/4 at the bilateral biceps, triceps, brachioradialis, 3/4 at the bilateral patella with cross adductor reflex and 1/4 at the bilateral achilles. Plantar response is upgoing on the right and neutral on the left.   Movement examination: Tone: There is just slight increase in tone in the left upper extremity (improvement) and there is normal tone in the right upper extremity.  The tone in the lower extremities is normal.  Abnormal movements: There is a rare left upper extremity resting tremor, only seen with ambulation today. Coordination:  There is definite decremation with RAM's, Including alternating supination and pronation  of the forearm, hand opening and closing, finger taps, heel taps and toe taps bilaterally.  There are pauses. Gait and Station: The patient is unable to arise out of a deep-seated chair without the use of the hands. The patient's stride length is decreased, but his stance is very wide-based.  There is a left upper extremity tremor with ambulation.  ASSESSMENT/PLAN:  1.  Parkinsonism.  I agree that this does represent idiopathic Parkinson's disease.  The patient has tremor, bradykinesia, rigidity and postural instability.  -We discussed the diagnosis as well as pathophysiology of the disease.  We discussed treatment options as well as prognostic indicators.  Patient education was provided.  -He is doing well and will continue to take carbidopa/levodopa 25/100, 1-1/2 tablets at 7 AM, 11 AM, 3 PM and 6 to 7 PM.  He will continue the carbidopa/levodopa 50/200 CR at bedtime.  -He will try and discontinue the selegiline.  If he becomes too sleepy, he will let me know. 2.  Lightheadedness, likely representing orthostatic hypotension.  -I. did send an e-mail to his primary care physician regarding the lisinopril.  I think that this is contributing. 3.  low back pain with hyperreflexia.  -An MRI of the lumbar spine in 2014 demonstrated moderate central canal stenosis at L4-L5 and bilateral neural foraminal stenosis at L5-S1.   4.  I will plan on seeing him back in 3-4 months.

## 2013-06-25 ENCOUNTER — Emergency Department (HOSPITAL_COMMUNITY): Payer: Medicare Other

## 2013-06-25 ENCOUNTER — Encounter (HOSPITAL_COMMUNITY): Payer: Self-pay | Admitting: Emergency Medicine

## 2013-06-25 ENCOUNTER — Inpatient Hospital Stay (HOSPITAL_COMMUNITY)
Admission: EM | Admit: 2013-06-25 | Discharge: 2013-07-04 | DRG: 064 | Disposition: A | Discharge status: Skilled Nursing Facility | Payer: Medicare Other | Attending: Neurology | Admitting: Neurology

## 2013-06-25 DIAGNOSIS — Z79899 Other long term (current) drug therapy: Secondary | ICD-10-CM

## 2013-06-25 DIAGNOSIS — Z951 Presence of aortocoronary bypass graft: Secondary | ICD-10-CM

## 2013-06-25 DIAGNOSIS — F3289 Other specified depressive episodes: Secondary | ICD-10-CM | POA: Diagnosis present

## 2013-06-25 DIAGNOSIS — I10 Essential (primary) hypertension: Secondary | ICD-10-CM | POA: Diagnosis present

## 2013-06-25 DIAGNOSIS — Z889 Allergy status to unspecified drugs, medicaments and biological substances status: Secondary | ICD-10-CM

## 2013-06-25 DIAGNOSIS — Z803 Family history of malignant neoplasm of breast: Secondary | ICD-10-CM

## 2013-06-25 DIAGNOSIS — I619 Nontraumatic intracerebral hemorrhage, unspecified: Principal | ICD-10-CM | POA: Diagnosis present

## 2013-06-25 DIAGNOSIS — E785 Hyperlipidemia, unspecified: Secondary | ICD-10-CM | POA: Diagnosis present

## 2013-06-25 DIAGNOSIS — I2510 Atherosclerotic heart disease of native coronary artery without angina pectoris: Secondary | ICD-10-CM | POA: Diagnosis present

## 2013-06-25 DIAGNOSIS — Z807 Family history of other malignant neoplasms of lymphoid, hematopoietic and related tissues: Secondary | ICD-10-CM

## 2013-06-25 DIAGNOSIS — G20A1 Parkinson's disease without dyskinesia, without mention of fluctuations: Secondary | ICD-10-CM | POA: Diagnosis present

## 2013-06-25 DIAGNOSIS — Z87891 Personal history of nicotine dependence: Secondary | ICD-10-CM

## 2013-06-25 DIAGNOSIS — G20 Parkinson's disease: Secondary | ICD-10-CM

## 2013-06-25 DIAGNOSIS — F329 Major depressive disorder, single episode, unspecified: Secondary | ICD-10-CM | POA: Diagnosis present

## 2013-06-25 DIAGNOSIS — G8190 Hemiplegia, unspecified affecting unspecified side: Secondary | ICD-10-CM | POA: Diagnosis present

## 2013-06-25 DIAGNOSIS — E46 Unspecified protein-calorie malnutrition: Secondary | ICD-10-CM | POA: Diagnosis present

## 2013-06-25 DIAGNOSIS — Z7982 Long term (current) use of aspirin: Secondary | ICD-10-CM

## 2013-06-25 DIAGNOSIS — G936 Cerebral edema: Secondary | ICD-10-CM | POA: Diagnosis present

## 2013-06-25 DIAGNOSIS — IMO0002 Reserved for concepts with insufficient information to code with codable children: Secondary | ICD-10-CM

## 2013-06-25 DIAGNOSIS — R4701 Aphasia: Secondary | ICD-10-CM | POA: Diagnosis present

## 2013-06-25 DIAGNOSIS — R471 Dysarthria and anarthria: Secondary | ICD-10-CM | POA: Diagnosis present

## 2013-06-25 DIAGNOSIS — E43 Unspecified severe protein-calorie malnutrition: Secondary | ICD-10-CM | POA: Diagnosis present

## 2013-06-25 DIAGNOSIS — Z823 Family history of stroke: Secondary | ICD-10-CM

## 2013-06-25 LAB — COMPREHENSIVE METABOLIC PANEL
ALT: <5 U/L (ref 0–53)
Albumin: 4.2 g/dL (ref 3.5–5.2)
Alkaline Phosphatase: 57 U/L (ref 39–117)
BUN: 28 mg/dL — ABNORMAL HIGH (ref 6–23)
Chloride: 100 mEq/L (ref 96–112)
GFR calc Af Amer: 73 mL/min — ABNORMAL LOW (ref >90)
Glucose, Bld: 88 mg/dL (ref 70–99)
Potassium: 4.3 mEq/L (ref 3.5–5.1)
Sodium: 136 mEq/L (ref 135–145)
Total Protein: 7 g/dL (ref 6.0–8.3)

## 2013-06-25 LAB — CBC
MCH: 30.9 pg (ref 26.0–34.0)
Platelets: 209 10*3/uL (ref 150–400)
RBC: 4.98 MIL/uL (ref 4.22–5.81)
WBC: 8.7 10*3/uL (ref 4.0–10.5)

## 2013-06-25 LAB — POCT I-STAT, CHEM 8
Calcium, Ion: 1.35 mmol/L — ABNORMAL HIGH (ref 1.13–1.30)
HCT: 47 % (ref 39.0–52.0)
Hemoglobin: 16 g/dL (ref 13.0–17.0)
TCO2: 23 mmol/L (ref 0–100)

## 2013-06-25 LAB — PROTIME-INR
INR: 1.08 (ref 0.00–1.49)
Prothrombin Time: 13.8 seconds (ref 11.6–15.2)

## 2013-06-25 LAB — DIFFERENTIAL
Basophils Relative: 1 % (ref 0–1)
Eosinophils Absolute: 0.2 10*3/uL (ref 0.0–0.7)
Lymphs Abs: 2 10*3/uL (ref 0.7–4.0)
Neutro Abs: 5.5 10*3/uL (ref 1.7–7.7)
Neutrophils Relative %: 63 % (ref 43–77)

## 2013-06-25 LAB — POCT I-STAT TROPONIN I: Troponin i, poc: 0.02 ng/mL (ref 0.00–0.08)

## 2013-06-25 LAB — ETHANOL: Alcohol, Ethyl (B): <11 mg/dL (ref 0–11)

## 2013-06-25 LAB — APTT: aPTT: 28 seconds (ref 24–37)

## 2013-06-25 MED ORDER — PANTOPRAZOLE SODIUM 40 MG IV SOLR
40.0000 mg | Freq: Every day | INTRAVENOUS | Status: DC
  Administered 2013-06-25 – 2013-07-03 (×8): 40 mg via INTRAVENOUS
  Filled 2013-06-25 (×11): qty 40

## 2013-06-25 MED ORDER — ACETAMINOPHEN 325 MG PO TABS: 650.0000 mg | ORAL_TABLET | ORAL | Status: DC | PRN

## 2013-06-25 MED ORDER — LABETALOL HCL 5 MG/ML IV SOLN
10.0000 mg | INTRAVENOUS | Status: DC | PRN
  Administered 2013-06-25 (×2): 20 mg via INTRAVENOUS
  Administered 2013-06-26 (×3): 10 mg via INTRAVENOUS
  Administered 2013-06-27 (×2): 20 mg via INTRAVENOUS
  Filled 2013-06-25 (×6): qty 4

## 2013-06-25 MED ORDER — SENNOSIDES-DOCUSATE SODIUM 8.6-50 MG PO TABS
1.0000 | ORAL_TABLET | Freq: Two times a day (BID) | ORAL | Status: DC
  Administered 2013-06-28 – 2013-07-04 (×7): 1 via ORAL
  Filled 2013-06-25 (×15): qty 1

## 2013-06-25 MED ORDER — SODIUM CHLORIDE 0.9 % IV SOLN
INTRAVENOUS | Status: DC
  Administered 2013-06-26 – 2013-06-27 (×2): via INTRAVENOUS
  Administered 2013-06-29: 1000 mL via INTRAVENOUS
  Administered 2013-06-29: 09:00:00 via INTRAVENOUS
  Administered 2013-06-30: 20 mL/h via INTRAVENOUS

## 2013-06-25 MED ORDER — ACETAMINOPHEN 650 MG RE SUPP
650.0000 mg | RECTAL | Status: DC | PRN
  Administered 2013-06-28: 650 mg via RECTAL
  Filled 2013-06-25: qty 1

## 2013-06-25 NOTE — ED Provider Notes (Signed)
CSN: [id number]     Arrival date & time 06/25/13  2120 History   First MD Initiated Contact with Patient 06/25/13 2137     Chief Complaint  Patient presents with  . Code Stroke   (Consider location/radiation/quality/duration/timing/severity/associated sxs/prior Treatment) Patient is a 79 y.o. male presenting with neurologic complaint. The history is provided by the EMS personnel.  Neurologic Problem This is a new problem. The current episode started less than 1 hour ago. The problem occurs constantly. The problem has not changed since onset.Pertinent negatives include no chest pain, no abdominal pain, no headaches and no shortness of breath. Nothing aggravates the symptoms. Nothing relieves the symptoms. He has tried nothing for the symptoms. The treatment provided no relief.    Past Medical History  Diagnosis Date  . Coronary artery disease   . Parkinson disease     dx: 06/2007  . Depression    Past Surgical History  Procedure Laterality Date  . Coronary artery bypass graft    . Hernia repair      x2  . Circumcision  Aug 2002  . Carpal tunnel release      L  . Knee surgery      bilateral   Family History  Problem Relation Age of Onset  . Cancer Sister     breast  . Cancer Brother     myeloma  . Stroke Father    History  Substance Use Topics  . Smoking status: Former Smoker    Quit date: 02/18/1958  . Smokeless tobacco: Never Used  . Alcohol Use: Yes     Comment: daily, glass wine daily    Review of Systems  Constitutional: Negative for fever.  HENT: Negative for drooling and rhinorrhea.   Eyes: Negative for pain.  Respiratory: Negative for cough and shortness of breath.   Cardiovascular: Negative for chest pain and leg swelling.  Gastrointestinal: Negative for nausea, vomiting, abdominal pain and diarrhea.  Genitourinary: Negative for dysuria and hematuria.  Musculoskeletal: Negative for gait problem and neck pain.  Skin: Negative for color change.   Neurological: Negative for numbness and headaches.  Hematological: Negative for adenopathy.  Psychiatric/Behavioral: Negative for behavioral problems.  All other systems reviewed and are negative.    Allergies  Ivp dye  Home Medications   Current Outpatient Rx  Name  Route  Sig  Dispense  Refill  . aspirin EC 81 MG tablet   Oral   Take 81 mg by mouth daily.         . carbidopa-levodopa (SINEMET CR) 50-200 MG per tablet   Oral   Take 1 tablet by mouth at bedtime.   90 tablet   3   . carbidopa-levodopa (SINEMET IR) 25-100 MG per tablet   Oral   Take 1.5 tablets by mouth 4 (four) times daily.   540 tablet   1   . Cholecalciferol 1000 UNITS TBDP   Oral   Take 1,000 Units by mouth daily.         Marland Kitchen escitalopram (LEXAPRO) 10 MG tablet   Oral   Take 1 tablet (10 mg total) by mouth daily.   90 tablet   3   . ibuprofen (ADVIL,MOTRIN) 200 MG tablet   Oral   Take 200 mg by mouth every 6 (six) hours as needed for pain.         Marland Kitchen lisinopril (PRINIVIL,ZESTRIL) 20 MG tablet   Oral   Take 10 mg by mouth daily.         Marland Kitchen  selegiline (ELDEPRYL) 5 MG tablet   Oral   Take 5 mg by mouth daily with breakfast.         . simvastatin (ZOCOR) 40 MG tablet   Oral   Take 40 mg by mouth at bedtime.            There were no vitals taken for this visit. Physical Exam  Nursing note and vitals reviewed. Constitutional: He appears well-developed and well-nourished.  HENT:  Head: Normocephalic and atraumatic.  Right Ear: External ear normal.  Left Ear: External ear normal.  Nose: Nose normal.  Mouth/Throat: Oropharynx is clear and moist. No oropharyngeal exudate.  Eyes: Conjunctivae and EOM are normal. Pupils are equal, round, and reactive to light.  Neck: Normal range of motion. Neck supple.  No spinal ttp.   Cardiovascular: Normal rate, regular rhythm, normal heart sounds and intact distal pulses.  Exam reveals no gallop and no friction rub.   No murmur  heard. Pulmonary/Chest: Effort normal and breath sounds normal. No respiratory distress. He has no wheezes.  Abdominal: Soft. Bowel sounds are normal. He exhibits no distension. There is no tenderness. There is no rebound and no guarding.  Musculoskeletal: Normal range of motion. He exhibits no edema and no tenderness.  No focal ttp of LE's, normal rom of hips w/out obvious pain on my exam.   Neurological: He is alert.  Left sided preferential gaze.   Pt unable to move his RUE and RLE. 4/5 strength in LUE/LLE.   Can answer basic questions by nodding his head.   Skin: Skin is warm and dry.  Psychiatric: He has a normal mood and affect. His behavior is normal.    ED Course  Procedures (including critical care time) Labs Review Labs Reviewed  COMPREHENSIVE METABOLIC PANEL - Abnormal; Notable for the following:    BUN 28 (*)    GFR calc non Af Amer 63 (*)    GFR calc Af Amer 73 (*)    All other components within normal limits  GLUCOSE, CAPILLARY - Abnormal; Notable for the following:    Glucose-Capillary 103 (*)    All other components within normal limits  POCT I-STAT, CHEM 8 - Abnormal; Notable for the following:    BUN 30 (*)    Creatinine, Ser 1.50 (*)    Calcium, Ion 1.35 (*)    All other components within normal limits  MRSA PCR SCREENING  MRSA PCR SCREENING  ETHANOL  PROTIME-INR  APTT  CBC  DIFFERENTIAL  TROPONIN I  URINE RAPID DRUG SCREEN (HOSP PERFORMED)  URINALYSIS, ROUTINE W REFLEX MICROSCOPIC  POCT I-STAT TROPONIN I   Imaging Review Ct Head Wo Contrast  06/25/2013   CLINICAL DATA:  Code stroke.  Fall with right facial droop.  EXAM: CT HEAD WITHOUT CONTRAST  TECHNIQUE: Contiguous axial images were obtained from the base of the skull through the vertex without intravenous contrast.  COMPARISON:  MR brain 02/21/2013.  FINDINGS: There is a rounded area of high attenuation in the left basal ganglia, measuring 2.2 x 2.4 cm, with a thin rim of surrounding  low-attenuation edema. Approximately 3 mm of left-to-right midline shift. No additional evidence of an acute infarct, mass lesion or hydrocephalus. Atrophy. Periventricular low attenuation. Scattered mucosal thickening in the paranasal sinuses. No air-fluid levels. Mastoid air cells are clear.  IMPRESSION: 1. Left basal ganglia hematoma with 3 mm left-to-right midline shift. Critical Value/emergent results were called by telephone at the time of interpretation on 06/25/2013 at 9:45 PM to  Dr.Stewart, who verbally acknowledged these results. 2. Atrophy and chronic microvascular white matter ischemic changes.   Electronically Signed   By: Leanna Battles M.D.   On: 06/25/2013 21:46    EKG Interpretation    Date/Time:  Wednesday June 25 2013 21:37:54 EST Ventricular Rate:  68 PR Interval:  327 QRS Duration: 158 QT Interval:  450 QTC Calculation: 479 R Axis:   73 Text Interpretation:  Sinus rhythm Multiform ventricular premature complexes Prolonged PR interval Right bundle branch block Confirmed by Eilene Voigt  MD, Alireza Pollack ([id number]) on 06/25/2013 11:54:41 PM            MDM   1. Hematoma of brain   2. Hypertension   3. PD (Parkinson's disease)    9:37 PM 79 y.o. male who pw fall while walking his dog. Patient reportedly called his neighbors shortly after the fall. They discovered him to be stuttering and weak on the right side of his body. The patient presented as a code stroke. He was found to have a left basal ganglia hematoma. No obvious traumatic injury on my exam. VS unremarkable.   Neuro will admit.     Junius Argyle, MD 06/26/13 [phone]

## 2013-06-25 NOTE — Code Documentation (Addendum)
79 yo wm brought in via [facility] for acute onset slurred speech, Rt weakness, & facial droop.  Per report pt was walking his dog at 2000 when he fell.  He called his dtr to come help him get up.  His dtr lives next door & she helped him get up.  She got him to the garage where he began to develop stuttering type speech & Rt weakness.  Code stroke called2107, pt arrival 2117, LKW 2000, stroke team arrival 2115, neurologist arrival 2125. Pt arrival in CT 2122, phlebotomist arrival 2115.  Pt not a candidate for acute treatment.  To be admitted by stroke service.

## 2013-06-25 NOTE — H&P (Signed)
Admission H&P    Chief Complaint: Acute onset of speech difficulty and right-sided weakness.  HPI: Eric Oneill is an 79 y.o. male with a history of hypertension, hyperlipidemia, coronary artery disease, and Parkinson's disease, presenting with new onset speech difficulty and right-sided weakness. Onset was 8:30 PM. There is  no previous history of stroke nor TIA. Patient has been taking aspirin 81 mg per day. CT scan of his head showed left basal ganglia acute intraparenchymal hemorrhage. There was no ventricular extension. NIH stroke score was 12.  LSN: 8:30 PM on 06/25/2013  tPA Given: No: ICH MRankin: 3   Past Medical History  Diagnosis Date  . Coronary artery disease   . Parkinson disease     dx: 06/2007  . Depression     Past Surgical History  Procedure Laterality Date  . Coronary artery bypass graft    . Hernia repair      x2  . Circumcision  Aug 2002  . Carpal tunnel release      L  . Knee surgery      bilateral    Family History  Problem Relation Age of Onset  . Cancer Sister     breast  . Cancer Brother     myeloma  . Stroke Father    Social History:  reports that he quit smoking about 55 years ago. He has never used smokeless tobacco. He reports that he drinks alcohol. He reports that he does not use illicit drugs.  Allergies:  Allergies  Allergen Reactions  . Ivp Dye [Iodinated Diagnostic Agents] Other (See Comments)    Dizziness      (Not in a hospital admission)  ROS: History obtained from child  General ROS: negative for - chills, fatigue, fever, night sweats, weight gain or weight loss Psychological ROS: negative for - behavioral disorder, hallucinations, memory difficulties, mood swings or suicidal ideation Ophthalmic ROS: negative for - blurry vision, double vision, eye pain or loss of vision ENT ROS: negative for - epistaxis, nasal discharge, oral lesions, sore throat, tinnitus or vertigo Allergy and Immunology ROS: negative for - hives  or itchy/watery eyes Hematological and Lymphatic ROS: negative for - bleeding problems, bruising or swollen lymph nodes Endocrine ROS: negative for - galactorrhea, hair pattern changes, polydipsia/polyuria or temperature intolerance Respiratory ROS: negative for - cough, hemoptysis, shortness of breath or wheezing Cardiovascular ROS: negative for - chest pain, dyspnea on exertion, edema or irregular heartbeat Gastrointestinal ROS: negative for - abdominal pain, diarrhea, hematemesis, nausea/vomiting or stool incontinence Genito-Urinary ROS: negative for - dysuria, hematuria, incontinence or urinary frequency/urgency Musculoskeletal ROS: negative for - joint swelling or muscular weakness Neurological ROS: as noted in HPI Dermatological ROS: negative for rash and skin lesion changes  Physical Examination: Blood pressure 186/94, pulse 68, resp. rate 18, SpO2 100.00%.  HEENT-  Normocephalic, no lesions, without obvious abnormality.  Normal external eye and conjunctiva.  Normal TM's bilaterally.  Normal auditory canals and external ears. Normal external nose, mucus membranes and septum.  Normal pharynx. Neck supple with no masses, nodes, nodules or enlargement. Cardiovascular - regular rate and rhythm, S1, S2 normal, no murmur, click, rub or gallop; occasional ventricular premature noted. Lungs - chest clear, no wheezing, rales, normal symmetric air entry, Heart exam - S1, S2 normal, no murmur, no gallop, rate regular Abdomen - soft, non-tender; bowel sounds normal; no masses,  no organomegaly Extremities - no joint deformities, effusion, or inflammation and no edema  Neurologic Examination: Mental Status: Alert, oriented, very low-volume  moderately slurred speech.  Slight difficulty with naming objects. Able to follow commands without difficulty. Cranial Nerves: II-Visual fields were normal. III/IV/VI-Pupils were equal and reacted. Extraocular movements were full and conjugate; gaze preference  to the left, however.    V/VII-mild right facial numbness; moderate right facial weakness. VIII-normal. X-minimal speech output which was very low-volume moderately dysarthric. Motor: Marked weakness of right upper extremity proximally and distally with flaccid muscle tone; moderately severe proximal and distal weakness of right lower extremity with flaccid muscle tone; normal strength and muscle tone of left extremities. Sensory: Reduced perception of tactile sensation over right extremities compared to left extremities. Deep Tendon Reflexes: 2+ and symmetric. Plantars: Flexor on the left and extensor on the right. Cerebellar: Normal finger-to-nose testing on the left; unable to evaluate right upper extremity. Carotid auscultation: Normal  Results for orders placed during the hospital encounter of 06/25/13 (from the past 48 hour(s))  POCT I-STAT TROPONIN I     Status: None   Collection Time    06/25/13  9:33 PM      Result Value Range   Troponin i, poc 0.02  0.00 - 0.08 ng/mL   Comment 3            Comment: Due to the release kinetics of cTnI,     a negative result within the first hours     of the onset of symptoms does not rule out     myocardial infarction with certainty.     If myocardial infarction is still suspected,     repeat the test at appropriate intervals.  POCT I-STAT, CHEM 8     Status: Abnormal   Collection Time    06/25/13  9:35 PM      Result Value Range   Sodium 139  135 - 145 mEq/L   Potassium 3.9  3.5 - 5.1 mEq/L   Chloride 103  96 - 112 mEq/L   BUN 30 (*) 6 - 23 mg/dL   Creatinine, Ser 1.61 (*) 0.50 - 1.35 mg/dL   Glucose, Bld 89  70 - 99 mg/dL   Calcium, Ion 0.96 (*) 1.13 - 1.30 mmol/L   TCO2 23  0 - 100 mmol/L   Hemoglobin 16.0  13.0 - 17.0 g/dL   HCT 04.5  40.9 - 81.1 %  GLUCOSE, CAPILLARY     Status: Abnormal   Collection Time    06/25/13  9:46 PM      Result Value Range   Glucose-Capillary 103 (*) 70 - 99 mg/dL   Ct Head Wo Contrast  06/25/2013    CLINICAL DATA:  Code stroke.  Fall with right facial droop.  EXAM: CT HEAD WITHOUT CONTRAST  TECHNIQUE: Contiguous axial images were obtained from the base of the skull through the vertex without intravenous contrast.  COMPARISON:  MR brain 02/21/2013.  FINDINGS: There is a rounded area of high attenuation in the left basal ganglia, measuring 2.2 x 2.4 cm, with a thin rim of surrounding low-attenuation edema. Approximately 3 mm of left-to-right midline shift. No additional evidence of an acute infarct, mass lesion or hydrocephalus. Atrophy. Periventricular low attenuation. Scattered mucosal thickening in the paranasal sinuses. No air-fluid levels. Mastoid air cells are clear.  IMPRESSION: 1. Left basal ganglia hematoma with 3 mm left-to-right midline shift. Critical Value/emergent results were called by telephone at the time of interpretation on 06/25/2013 at 9:45 PM to Dr.Jagjit Riner, who verbally acknowledged these results. 2. Atrophy and chronic microvascular white matter ischemic changes.   Electronically  Signed   By: Leanna Battles M.D.   On: 06/25/2013 21:46    Assessment: 79 y.o. male  presenting with acute left basal ganglia intraparenchymal hemorrhage with speech difficulty and severe right hemiparesis.  Stroke Risk Factors - hyperlipidemia and hypertension  Plan: 1. HgbA1c, fasting lipid panel 2. MRI, MRA  of the brain without contrast 3. PT consult, OT consult, Speech consult 4. Echocardiogram 5. Carotid dopplers 6. Prophylactic therapy-None 7. Risk factor modification 8. repeat noncontrast CT scan of the head in the a.m.   C.R. Roseanne Reno, MD [facility] [phone]  06/25/2013, 9:51 PM

## 2013-06-26 ENCOUNTER — Inpatient Hospital Stay (HOSPITAL_COMMUNITY): Payer: Medicare Other

## 2013-06-26 LAB — RAPID URINE DRUG SCREEN, HOSP PERFORMED
Amphetamines: NOT DETECTED
Barbiturates: NOT DETECTED
Benzodiazepines: NOT DETECTED
Cocaine: NOT DETECTED
Tetrahydrocannabinol: NOT DETECTED

## 2013-06-26 LAB — URINALYSIS, ROUTINE W REFLEX MICROSCOPIC
Ketones, ur: NEGATIVE mg/dL
Leukocytes, UA: NEGATIVE
Nitrite: NEGATIVE
Protein, ur: NEGATIVE mg/dL
Urobilinogen, UA: 0.2 mg/dL (ref 0.0–1.0)

## 2013-06-26 LAB — MRSA PCR SCREENING: MRSA by PCR: NEGATIVE

## 2013-06-26 NOTE — Progress Notes (Signed)
Stroke Team Progress Note  HISTORY Eric Oneill is an 79 y.o. male with a history of hypertension, hyperlipidemia, coronary artery disease, and Parkinson's disease, presenting with new onset speech difficulty and right-sided weakness. Onset was 8:30 PM. There is no previous history of stroke nor TIA. Patient has been taking aspirin 81 mg per day. CT scan of his head showed left basal ganglia acute intraparenchymal hemorrhage. There was no ventricular extension. NIH stroke score was 12.  LSN: 8:30 PM on 06/25/2013  tPA Given: No: ICH  MRankin: 3    Patient was not a TPA candidate secondary to ICH.   SUBJECTIVE 2 daughters are at bedside. The patient has had some increased lethargy overnight. The family and nursing staff are concerned about mental status change.  OBJECTIVE Most recent Vital Signs: Filed Vitals:   06/26/13 0732 06/26/13 0800 06/26/13 0830 06/26/13 0900  BP:  156/71 139/73 149/64  Pulse:  63 59 59  Temp: 98.1 F (36.7 C)     TempSrc: Oral     Resp:  22 20 17   Height:      Weight:      SpO2:  97% 98% 98%   CBG (last 3)   Recent Labs  06/25/13 2146  GLUCAP 103*    IV Fluid Intake:   . sodium chloride 75 mL/hr at 06/26/13 0416    MEDICATIONS  . pantoprazole (PROTONIX) IV  40 mg Intravenous QHS  . senna-docusate  1 tablet Oral BID   PRN:  acetaminophen, acetaminophen, labetalol  Diet:  NPO Activity:  Bedrest DVT Prophylaxis:  SCD  CLINICALLY SIGNIFICANT STUDIES Basic Metabolic Panel:  Recent Labs Lab 06/25/13 2129 06/25/13 2135  NA 136 139  K 4.3 3.9  CL 100 103  CO2 25  --   GLUCOSE 88 89  BUN 28* 30*  CREATININE 1.07 1.50*  CALCIUM 10.2  --    Liver Function Tests:  Recent Labs Lab 06/25/13 2129  AST 21  ALT <5  ALKPHOS 57  BILITOT 0.5  PROT 7.0  ALBUMIN 4.2   CBC:  Recent Labs Lab 06/25/13 2129 06/25/13 2135  WBC 8.7  --   NEUTROABS 5.5  --   HGB 15.4 16.0  HCT 44.9 47.0  MCV 90.2  --   PLT 209  --    Coagulation:   Recent Labs Lab 06/25/13 2129  LABPROT 13.8  INR 1.08   Cardiac Enzymes:  Recent Labs Lab 06/25/13 2129  TROPONINI <0.30   Urinalysis:  Recent Labs Lab 06/26/13 0101  COLORURINE YELLOW  LABSPEC 1.016  PHURINE 6.5  GLUCOSEU NEGATIVE  HGBUR NEGATIVE  BILIRUBINUR NEGATIVE  KETONESUR NEGATIVE  PROTEINUR NEGATIVE  UROBILINOGEN 0.2  NITRITE NEGATIVE  LEUKOCYTESUR NEGATIVE   Lipid Panel No results found for this basename: chol, trig, hdl, cholhdl, vldl, ldlcalc   HgbA1C  No results found for this basename: HGBA1C    Urine Drug Screen:     Component Value Date/Time   LABOPIA NONE DETECTED 06/26/2013 0101   COCAINSCRNUR NONE DETECTED 06/26/2013 0101   LABBENZ NONE DETECTED 06/26/2013 0101   AMPHETMU NONE DETECTED 06/26/2013 0101   THCU NONE DETECTED 06/26/2013 0101   LABBARB NONE DETECTED 06/26/2013 0101    Alcohol Level:  Recent Labs Lab 06/25/13 2129  ETH <11    Ct Head Wo Contrast  06/25/2013   CLINICAL DATA:  Code stroke.  Fall with right facial droop.  EXAM: CT HEAD WITHOUT CONTRAST  TECHNIQUE: Contiguous axial images were obtained from the base  of the skull through the vertex without intravenous contrast.  COMPARISON:  MR brain 02/21/2013.  FINDINGS: There is a rounded area of high attenuation in the left basal ganglia, measuring 2.2 x 2.4 cm, with a thin rim of surrounding low-attenuation edema. Approximately 3 mm of left-to-right midline shift. No additional evidence of an acute infarct, mass lesion or hydrocephalus. Atrophy. Periventricular low attenuation. Scattered mucosal thickening in the paranasal sinuses. No air-fluid levels. Mastoid air cells are clear.  IMPRESSION: 1. Left basal ganglia hematoma with 3 mm left-to-right midline shift. Critical Value/emergent results were called by telephone at the time of interpretation on 06/25/2013 at 9:45 PM to Dr.Stewart, who verbally acknowledged these results. 2. Atrophy and chronic microvascular white matter  ischemic changes.   Electronically Signed   By: Leanna Battles M.D.   On: 06/25/2013 21:46    CT of the brain   IMPRESSION:  1. Left basal ganglia hematoma with 3 mm left-to-right midline  shift. Critical Value/emergent results were called by telephone at  the time of interpretation on 06/25/2013 at 9:45 PM to Dr.Stewart,  who verbally acknowledged these results.  2. Atrophy and chronic microvascular white matter ischemic changes.  MRI of the brain    MRA of the brain    2D Echocardiogram    Carotid Doppler    CXR    EKG   Sinus rhythm Multiform ventricular premature complexes Prolonged PR interval Right bundle branch block  Therapy Recommendations Pending  Physical Exam  General: The patient is sleepy, but can be aroused.  Respiratory: Lung fields are clear.  Cardiovascular: There is a regular rate and rhythm, no obvious murmurs or rubs are noted.  Skin: No significant peripheral edema is noted.   Neurologic Exam  Mental status: The patient is arousable, not following commands well.  Cranial nerves: Facial symmetry is present, depression of the right nasolabial fold. Gaze preference is to the left. The patient will blink to threat from the left, minimal blink on the right. The patient is minimally verbal, will occasionally attempt to whisper some words.  Motor: The patient has a right hemiparesis, arm greater than leg. The patient moves the left side.  Sensory examination: The patient has some response to deep pain stimulation on all fours.  Coordination: The patient is not following commands well enough for cerebellar testing.  Gait and station: The gait cannot be tested.  Reflexes: Deep tendon reflexes are symmetric.    ASSESSMENT Mr. Eric Oneill is a 79 y.o. male presenting with left BG ICH. No intraventricular extension. Patient was on aspirin prior to admission. There is a pre-existing history of Parkinson's disease and coronary artery disease.  The patient has a right hemiparesis, left gaze preference. Some worsening of mental status overnight. The patient is not a TPA candidate secondary to hemorrhage.   Parkinson's disease  Left BG ICH  Dyslipidemia  Coronary artery disease  Hospital day # 1  CT the head that was done today does show some enlargement of the intrarenal hemorrhage, no extension into the intraventricular space.  TREATMENT/PLAN  Hold aspirin    supportive care  Physical and occupational, speech therapy evaluation when appropriate   We'll need to restart Parkinson's medications as soon as possible  Repeat ct head in am.  Follow mental status   06/26/2013 10:57 AM

## 2013-06-26 NOTE — Evaluation (Addendum)
Clinical/Bedside Swallow Evaluation Patient Details  Name: Eric Oneill MRN: [id number] Date of Birth: Aug 29, 1927  Today's Date: 06/26/2013 Time: 6295-2841 SLP Time Calculation (min): 21 min  Past Medical History:  Past Medical History  Diagnosis Date  . Coronary artery disease   . Parkinson disease     dx: 06/2007  . Depression    Past Surgical History:  Past Surgical History  Procedure Laterality Date  . Coronary artery bypass graft    . Hernia repair      x2  . Circumcision  Aug 2002  . Carpal tunnel release      L  . Knee surgery      bilateral   HPI:  Eric Oneill adm to [facility] with speech deficits and right sided weakness.  Eric Oneill found to have a left BG hemorrhage.  PMH + for Parkinson's disease, CAD, depression, dysphagia.  Eric Oneill for BSE as she did not pass a RN stroke swallow screen.    Assessment / Plan / Recommendation Clinical Impression  Eric Oneill with limited evaluation due to his mental status/lethargy.  Initially he did not awaken but when SLP began to leave room Eric Oneill opened his eyes and looked around, therefore SLP continued efforts.  Eric Oneill with apparent right facial, labial deficits - suspect lingual/pharyngeal musculature issues as well.  Standing secretions noted in oral cavity without Eric Oneill attempts to clear spontaneously.  SLP orally suctioned Eric Oneill, a single swallow was elicited that appeared weak.  Per family Eric Oneill has h/o xerostomia and excessive secretions.  SLP did not administer po due to decreased mental status and secretion management.  Advised Eric Oneill's family to encourage Eric Oneill to attend to his right side, he tends to look left only.   Recommend Eric Oneill continue NPO with oral care.  SLP educated family to findings, recommendations, demonstrated use of oral suction and advised to ability to maintain oral/labial moisture for patient.      Aspiration Risk  Severe    Diet Recommendation NPO        Other  Recommendations   TBD  Follow Up Recommendations  Inpatient Rehab;Skilled  Nursing facility    Frequency and Duration min 1 x/week  1 week   Pertinent Vitals/Pain Afebrile, decreased    SLP Swallow Goals     Swallow Study Prior Functional Status    see HHX section     General Date of Onset: 06/26/13 HPI: Eric Oneill adm to [facility] with speech deficits and right sided weakness.  Eric Oneill found to have a left BG hemorrhage.  PMH + for Parkinson's disease, CAD, depression, dysphagia.  Eric Oneill for BSE as she did not pass a RN stroke swallow screen.  Type of Study: Bedside swallow evaluation Previous Swallow Assessment: MBS:  12/2012 OUTPT, mild oropharyngeal dysphagia without aspiration of any consistency tested.  Rec soft/ground meats/thin, stasis, penetration of thin x1 of 10 Diet Prior to this Study: NPO Temperature Spikes Noted: No Respiratory Status: Nasal cannula History of Recent Intubation: No Behavior/Cognition: Lethargic;Decreased sustained attention Oral Cavity - Dentition: Adequate natural dentition Self-Feeding Abilities: Total assist Patient Positioning: Upright in bed Baseline Vocal Quality: Aphonic Volitional Cough: Cognitively unable to elicit Volitional Swallow: Unable to elicit (reflexive swallow noted)    Oral/Motor/Sensory Function Overall Oral Motor/Sensory Function:  (Eric Oneill did not follow commands, apparent right labial, facial droop, Eric Oneill's lethargy limits evaluation) Labial ROM: Reduced right Labial Strength: Reduced Lingual ROM: Reduced right Facial Symmetry: Right droop Facial Sensation: Reduced   Ice Chips Ice chips: Not tested  Thin Liquid Thin Liquid: Not tested    Nectar Thick Nectar Thick Liquid: Not tested   Honey Thick Honey Thick Liquid: Not tested   Puree Puree: Not tested   Solid   GO    Solid: Not tested       Donavan Burnet, MS [facility] SLP [phone]

## 2013-06-26 NOTE — Progress Notes (Signed)
Pt transferred to 3M05.  Pt transferred on bed.  All VS WNL upon transfer - pt's rhythm had been SR to SB in mid 50s.  Daughter was present on time of transfer.  Daughter took pt's clothing home with her - no other belongings were in the room or transferred with the pt.  Report had been called to [facility].

## 2013-06-27 ENCOUNTER — Inpatient Hospital Stay (HOSPITAL_COMMUNITY): Payer: Medicare Other

## 2013-06-27 LAB — COMPREHENSIVE METABOLIC PANEL
AST: 29 U/L (ref 0–37)
BUN: 20 mg/dL (ref 6–23)
CO2: 22 mEq/L (ref 19–32)
Calcium: 9.3 mg/dL (ref 8.4–10.5)
Chloride: 100 mEq/L (ref 96–112)
Creatinine, Ser: 0.9 mg/dL (ref 0.50–1.35)
GFR calc Af Amer: 89 mL/min — ABNORMAL LOW (ref >90)
GFR calc non Af Amer: 77 mL/min — ABNORMAL LOW (ref >90)
Glucose, Bld: 85 mg/dL (ref 70–99)
Total Bilirubin: 1.4 mg/dL — ABNORMAL HIGH (ref 0.3–1.2)

## 2013-06-27 LAB — CBC WITH DIFFERENTIAL/PLATELET
Basophils Absolute: 0 10*3/uL (ref 0.0–0.1)
Eosinophils Absolute: 0.1 10*3/uL (ref 0.0–0.7)
Eosinophils Relative: 1 % (ref 0–5)
HCT: 41.7 % (ref 39.0–52.0)
Hemoglobin: 14.3 g/dL (ref 13.0–17.0)
Lymphocytes Relative: 17 % (ref 12–46)
Lymphs Abs: 1.7 10*3/uL (ref 0.7–4.0)
MCV: 89.1 fL (ref 78.0–100.0)
Monocytes Absolute: 1.1 10*3/uL — ABNORMAL HIGH (ref 0.1–1.0)
Monocytes Relative: 12 % (ref 3–12)
Neutro Abs: 6.8 10*3/uL (ref 1.7–7.7)
Neutrophils Relative %: 70 % (ref 43–77)
RDW: 13.1 % (ref 11.5–15.5)
WBC: 9.8 10*3/uL (ref 4.0–10.5)

## 2013-06-27 MED ORDER — CLONIDINE HCL 0.1 MG/24HR TD PTWK
0.1000 mg | MEDICATED_PATCH | TRANSDERMAL | Status: DC
  Administered 2013-06-27: 0.1 mg via TRANSDERMAL
  Filled 2013-06-27: qty 1

## 2013-06-27 MED ORDER — BIOTENE DRY MOUTH MT LIQD
15.0000 mL | Freq: Two times a day (BID) | OROMUCOSAL | Status: DC
  Administered 2013-06-28 – 2013-07-04 (×13): 15 mL via OROMUCOSAL

## 2013-06-27 MED ORDER — HYDRALAZINE HCL 20 MG/ML IJ SOLN
10.0000 mg | INTRAMUSCULAR | Status: DC | PRN
  Administered 2013-06-27 – 2013-07-01 (×4): 10 mg via INTRAVENOUS
  Filled 2013-06-27 (×3): qty 1

## 2013-06-27 MED ORDER — HYDRALAZINE HCL 20 MG/ML IJ SOLN
INTRAMUSCULAR | Status: AC
  Filled 2013-06-27: qty 1

## 2013-06-27 MED ORDER — CHLORHEXIDINE GLUCONATE 0.12 % MT SOLN
15.0000 mL | Freq: Two times a day (BID) | OROMUCOSAL | Status: DC
  Administered 2013-06-27 – 2013-07-04 (×13): 15 mL via OROMUCOSAL
  Filled 2013-06-27 (×15): qty 15

## 2013-06-27 NOTE — Progress Notes (Signed)
Speech Language Pathology Treatment: Dysphagia  Patient Details Name: ALYAN HARTLINE MRN: [id number] DOB: 11-01-1927 Today's Date: 06/27/2013 Time: 4098-1191 SLP Time Calculation (min): 35 min  Assessment / Plan / Recommendation Clinical Impression  Pt still too lethargic to safely attempt POs. With just one ice chip, though pt did masticate and swallow x2, there was immediate evidence of aspiration. Pt may benefit from short term alternate nutrition until arousal improves for POs.    HPI HPI: 79 yo male adm to [facility] with speech deficits and right sided weakness.  Pt found to have a left BG hemorrhage.  PMH + for Parkinson's disease, CAD, depression, dysphagia.  Pt for BSE as she did not pass a RN stroke swallow screen.    Pertinent Vitals NA  SLP Plan  Continue with current plan of care    Recommendations Diet recommendations: NPO Medication Administration: Via alternative means              General recommendations: Rehab consult Oral Care Recommendations: Oral care Q4 per protocol Follow up Recommendations: Inpatient Rehab Plan: Continue with current plan of care    GO    [facility], MA CCC-SLP [phone]    Claudine Mouton 06/27/2013, 11:10 AM

## 2013-06-27 NOTE — Evaluation (Signed)
Physical Therapy Evaluation Patient Details Name: Eric Oneill MRN: [id number] DOB: 1928-07-17 Today's Date: 06/27/2013 Time: 7829-5621 PT Time Calculation (min): 40 min  PT Assessment / Plan / Recommendation History of Present Illness  Eric Oneill is an 79 y.o. male with a history of hypertension, hyperlipidemia, coronary artery disease, and Parkinson's disease, presenting with new onset speech difficulty and right-sided weakness. Onset was 8:30 PM. There is no previous history of stroke nor TIA. Patient has been taking aspirin 81 mg per day. CT scan of his head showed left basal ganglia acute intraparenchymal hemorrhage. There was no ventricular extension. NIH stroke score was 12.    Clinical Impression  Pt admitted with the above. Pt currently with functional limitations due to the deficits listed below (see PT Problem List). Pt attempted to vocalize however unable to determine words.  Pt very lethargic and needs cues to keep eyes open.  Pt with left gaze preference however able to pass midline briefly to right side.  Pt has very supportive family and able to provide 24/7 assistance after d/c.  Pt will benefit from skilled PT to increase their independence and safety with mobility to allow discharge to the venue listed below.      PT Assessment  Patient needs continued PT services    Follow Up Recommendations  CIR    Does the patient have the potential to tolerate intense rehabilitation      Barriers to Discharge        Equipment Recommendations  None recommended by PT    Recommendations for Other Services Rehab consult   Frequency Min 4X/week    Precautions / Restrictions Precautions Precautions: Fall Precaution Comments: posterior lean. L bias   Pertinent Vitals/Pain Unable to rate; VSS      Mobility  Bed Mobility Bed Mobility: Left Sidelying to Sit;Sitting - Scoot to Edge of Bed;Sit to Supine Left Sidelying to Sit: 2: Max assist;HOB elevated Sitting - Scoot  to [place] of Bed: 2: Max assist Sit to Supine: 1: +2 Total assist Sit to Supine: Patient Percentage: 10% Details for Bed Mobility Assistance: (A) to elevate trunk OOB and maintain balance.  Transfers Transfers: Sit to Stand;Stand to Sit Sit to Stand: 1: +2 Total assist;From bed Sit to Stand: Patient Percentage: 30% Stand to Sit: To bed;1: +2 Total assist Stand to Sit: Patient Percentage: 30% Details for Transfer Assistance: (A) to initiate transfer with cues for forward translation and maintain balance.  Ambulation/Gait Ambulation/Gait Assistance: Not tested (comment) Modified Rankin (Stroke Patients Only) Pre-Morbid Rankin Score: No symptoms Modified Rankin: Severe disability    Exercises     PT Diagnosis: Hemiplegia dominant side  PT Problem List: Decreased strength;Decreased activity tolerance;Decreased balance;Decreased mobility;Decreased cognition;Decreased knowledge of use of DME;Decreased safety awareness;Pain PT Treatment Interventions: DME instruction;Gait training;Functional mobility training;Therapeutic activities;Therapeutic exercise;Balance training;Patient/family education;Cognitive remediation;Neuromuscular re-education     PT Goals(Current goals can be found in the care plan section) Acute Rehab PT Goals Patient Stated Goal: unable to state PT Goal Formulation: With patient/family Time For Goal Achievement: 07/11/13 Potential to Achieve Goals: Fair  Visit Information  Last PT Received On: 06/27/13 Assistance Needed: +2 History of Present Illness: Eric Oneill is an 79 y.o. male with a history of hypertension, hyperlipidemia, coronary artery disease, and Parkinson's disease, presenting with new onset speech difficulty and right-sided weakness. Onset was 8:30 PM. There is no previous history of stroke nor TIA. Patient has been taking aspirin 81 mg per day. CT scan of his head showed left  basal ganglia acute intraparenchymal hemorrhage. There was no ventricular  extension. NIH stroke score was 12.         Prior Functioning  Home Living Family/patient expects to be discharged to:: Inpatient rehab Available Help at Discharge: Family Type of Home: House Additional Comments: Family states they can provide 24/7 assistance  Lives With: Alone;Other (Comment) (beside family's home) Prior Function Level of Independence: Independent;Needs assistance Gait / Transfers Assistance Needed: used a cane during the day and a walker at night ADL's / Homemaking Assistance Needed: family assisted with meals but pt did his own cleaning Communication Communication: Other (comment);Expressive difficulties (dysarthric) Dominant Hand: Right    Cognition  Cognition Arousal/Alertness: Lethargic Behavior During Therapy: Flat affect Overall Cognitive Status: Impaired/Different from baseline Area of Impairment: Orientation;Attention;Following commands;Awareness;Problem solving Current Attention Level: Focused Following Commands: Follows one step commands with increased time Awareness: Intellectual Problem Solving: Slow processing;Decreased initiation;Requires verbal cues;Requires tactile cues    Extremity/Trunk Assessment Upper Extremity Assessment Upper Extremity Assessment: RUE deficits/detail RUE Deficits / Details: Brunstrom level 1. pre synergy. mod flexer tone RUE:  (no functional movement observed at this time; R scap retract) RUE Sensation: decreased light touch RUE Coordination: decreased fine motor;decreased gross motor Lower Extremity Assessment Lower Extremity Assessment: RLE deficits/detail RLE Deficits / Details: Brunstrom level 1. pre synergy. mod extensor tone; PF tone Cervical / Trunk Assessment Cervical / Trunk Assessment: Kyphotic;Other exceptions (unable to achieve full upright posture in standing)   Balance Balance Balance Assessed: Yes Static Sitting Balance Static Sitting - Balance Support: Feet supported;Bilateral upper extremity  supported Static Sitting - Level of Assistance: 2: Max assist (posterior bias. R lean when tired. Able to maintian midline) Static Sitting - Comment/# of Minutes: 15 Static Standing Balance Static Standing - Balance Support: Bilateral upper extremity supported Static Standing - Level of Assistance: 1: +2 Total assist  End of Session PT - End of Session Equipment Utilized During Treatment: Gait belt Activity Tolerance: Patient tolerated treatment well Patient left: in bed;with call bell/phone within reach;with nursing/sitter in room Nurse Communication: Mobility status  GP     Cebastian Neis 06/27/2013, 5:13 PM  Jake Shark, PT DPT [phone]

## 2013-06-27 NOTE — Evaluation (Signed)
Speech Language Pathology Evaluation Patient Details Name: Eric Oneill MRN: [id number] DOB: May 02, 1928 Today's Date: 06/27/2013 Time: 0825-0850 SLP Time Calculation (min): 25 min  Problem List:  Patient Active Problem List   Diagnosis Date Noted  . Hematoma of brain 06/25/2013  . Brain hematoma 06/25/2013  . Hypertension 06/25/2013  . ICH (intracerebral hemorrhage) 06/25/2013  . PD (Parkinson's disease) 02/18/2013   Past Medical History:  Past Medical History  Diagnosis Date  . Coronary artery disease   . Parkinson disease     dx: 06/2007  . Depression    Past Surgical History:  Past Surgical History  Procedure Laterality Date  . Coronary artery bypass graft    . Hernia repair      x2  . Circumcision  Aug 2002  . Carpal tunnel release      L  . Knee surgery      bilateral   HPI:  79 yo male adm to [facility] with speech deficits and right sided weakness.  Pt found to have a left BG hemorrhage.  PMH + for Parkinson's disease, CAD, depression, dysphagia.  Pt for BSE as she did not pass a RN stroke swallow screen.    Assessment / Plan / Recommendation Clinical Impression  Pt presents with lethargy impacting ability to participate in basic communication and functional tasks. He did demosntrate some basic comprehnsion of simple commands and very quiet 'mmhmm'/head nod, correct responses, to Y/N questions x3. Pt was able to wash his face for 10 seconds with max verbal and moderate tactile cues to initiate. No true attempts to verbalize words. Pt may have a severe dysarthia or even expressive langauge impairment. Will await improved arousal for further diagnostic treatment. Pt may be a good impatient rehab candidate as he progresses.     SLP Assessment  Patient needs continued Speech Lanaguage Pathology Services    Follow Up Recommendations  Inpatient Rehab    Frequency and Duration min 2x/week  2 weeks   Pertinent Vitals/Pain NA   SLP Goals  SLP Goals Potential to  Achieve Goals: Good Progress/Goals/Alternative treatment plan discussed with pt/caregiver and they: Agree  SLP Evaluation Prior Functioning  Baseline deficit details: Parkinsons, family provides min assist with complex tasks.  Type of Home: House  Lives With: Other (Comment) (lives behind daughter) Available Help at Discharge: Family   Cognition  Overall Cognitive Status: Impaired/Different from baseline Arousal/Alertness: Lethargic Orientation Level: Oriented to person Attention: Focused Focused Attention: Impaired Focused Attention Impairment: Verbal basic;Functional basic Memory:  (UTA) Problem Solving: Impaired Problem Solving Impairment: Functional basic Executive Function: Initiating Initiating: Impaired    Comprehension  Auditory Comprehension Overall Auditory Comprehension: Impaired Yes/No Questions: Within Functional Limits Commands: Impaired One Step Basic Commands: 50-74% accurate Conversation: Simple Interfering Components: Attention EffectiveTechniques: Visual/Gestural cues;Repetition;Extra processing time Visual Recognition/Discrimination Discrimination: Not tested Reading Comprehension Reading Status: Not tested    Expression Expression Primary Mode of Expression: Nonverbal - gestures Verbal Expression Overall Verbal Expression: Impaired Initiation: Impaired Automatic Speech:  (none) Level of Generative/Spontaneous Verbalization:  (none) Repetition: Impaired (unable) Level of Impairment: Word level Naming: Not tested   Oral / Motor Oral Motor/Sensory Function Overall Oral Motor/Sensory Function: Impaired Labial ROM: Reduced right Labial Symmetry: Abnormal symmetry right Labial Strength: Reduced Lingual ROM: Reduced right Lingual Strength: Reduced Facial Symmetry: Right droop Facial Strength: Reduced Motor Speech Overall Motor Speech: Impaired Respiration: Within functional limits Phonation: Low vocal intensity;Wet Articulation:  Impaired Level of Impairment: Word Intelligibility: Intelligibility reduced Word: 0-24% accurate   GO  Harlon Ditty, [place] CCC-SLP [phone]  Claudine Mouton 06/27/2013, 11:06 AM

## 2013-06-27 NOTE — Progress Notes (Signed)
Occupational Therapy Evaluation Patient Details Name: Eric Oneill MRN: [id number] DOB: [birth year] Today's Date: 06/27/2013 Time: 4098-1191 OT Time Calculation (min): 59 min  OT Assessment / Plan / Recommendation History of present illness Eric Oneill is an 79 y.o. male with a history of hypertension, hyperlipidemia, coronary artery disease, and Parkinson's disease, presenting with new onset speech difficulty and right-sided weakness. Onset was 8:30 PM. There is no previous history of stroke nor TIA. Patient has been taking aspirin 81 mg per day. CT scan of his head showed left basal ganglia acute intraparenchymal hemorrhage. There was no ventricular extension. NIH stroke score was 12.     Clinical Impression   PTA, pt lived alone in home beside his daughter. He was independent with ADL and mod I with mobility with [place] or RW level. Daughter assisted with meals. Very supportive family who can provide 24/7 assistance after D/C. Feel pt is appropriate for CIR given family's desire to care for pt after D/C. Pt will benefit from skilled OT services to facilitate D/C to CIR due to below deficits. Discussed rehab process in detail with pt's daughters, who would like CIR. Will further assess vision. Apparent R inattention.     OT Assessment  Patient needs continued OT Services    Follow Up Recommendations  CIR    Barriers to Discharge      Equipment Recommendations  3 in 1 bedside comode;Tub/shower bench    Recommendations for Other Services Rehab consult  Frequency  Min 3X/week    Precautions / Restrictions Precautions Precautions: Fall Precaution Comments: posterior lean. L bias   Pertinent Vitals/Pain Vitals stable.    ADL  Eating/Feeding: NPO Grooming: Maximal assistance Where Assessed - Grooming: Supported sitting Upper Body Bathing: +1 Total assistance Where Assessed - Upper Body Bathing: Supported sitting Lower Body Bathing: +1 Total assistance Where Assessed - Lower  Body Bathing: Supported sit to stand Upper Body Dressing: +1 Total assistance Where Assessed - Upper Body Dressing: Supported sitting Lower Body Dressing: +1 Total assistance Where Assessed - Lower Body Dressing: Supported sit to Pharmacist, hospital: +2 Total assistance Toilet Transfer: Patient Percentage: 30% Equipment Used: Gait belt Transfers/Ambulation Related to ADLs: +2 . R bias ADL Comments: Pushing to R and posteirorly at times. Washed face with washcloth on command. Asking for water.    OT Diagnosis: Generalized weakness;Cognitive deficits;Disturbance of vision;Hemiplegia dominant side  OT Problem List: Decreased strength;Decreased range of motion;Decreased activity tolerance;Impaired balance (sitting and/or standing);Impaired vision/perception;Decreased coordination;Decreased cognition;Decreased safety awareness;Decreased knowledge of use of DME or AE;Decreased knowledge of precautions;Impaired sensation;Impaired tone;Impaired UE functional use OT Treatment Interventions: Self-care/ADL training;Therapeutic exercise;Neuromuscular education;Energy conservation;DME and/or AE instruction;Therapeutic activities;Patient/family education;Visual/perceptual remediation/compensation;Cognitive remediation/compensation;Balance training   OT Goals(Current goals can be found in the care plan section) Acute Rehab OT Goals Patient Stated Goal: unable to state OT Goal Formulation: With family Time For Goal Achievement: 07/11/13 Potential to Achieve Goals: Good ADL Goals Pt Will Perform Grooming: with set-up;with supervision;sitting Pt Will Perform Upper Body Bathing: with min assist;sitting Pt Will Perform Upper Body Dressing: with min assist;sitting Pt Will Transfer to Toilet: with min assist;stand pivot transfer;bedside commode Pt Will Perform Toileting - Clothing Manipulation and hygiene: with min assist;sit to/from stand Pt/caregiver will Perform Home Exercise Program: Right Upper  extremity;With minimal assist Additional ADL Goal #1: locate 3 items in R visual field with min vc  Visit Information  Last OT Received On: 06/27/13 Assistance Needed: +2 History of Present Illness: Eric Oneill is an 79 y.o. male with  a history of hypertension, hyperlipidemia, coronary artery disease, and Parkinson's disease, presenting with new onset speech difficulty and right-sided weakness. Onset was 8:30 PM. There is no previous history of stroke nor TIA. Patient has been taking aspirin 81 mg per day. CT scan of his head showed left basal ganglia acute intraparenchymal hemorrhage. There was no ventricular extension. NIH stroke score was 12.         Prior Functioning     Home Living Family/patient expects to be discharged to:: Inpatient rehab Available Help at Discharge: Family Type of Home: House Additional Comments: Family states they can provide 24/7 assistance  Lives With: Alone;Other (Comment) (beside family's home) Prior Function Level of Independence: Independent;Needs assistance Gait / Transfers Assistance Needed: used a cane during the day and a walker at night ADL's / Homemaking Assistance Needed: family assisted with meals but pt did his own cleaning Communication Communication: Other (comment);Expressive difficulties (dysarthric) Dominant Hand: Right         Vision/Perception Vision - History Baseline Vision: Wears glasses all the time Vision - Assessment Eye Alignment: Impaired (comment) Vision Assessment: Vision impaired - to be further tested in functional context Perception Perception: Impaired Inattention/Neglect: Does not attend to right visual field;Does not attend to right side of body Praxis Praxis: Impaired Praxis Impairment Details: Initiation   Cognition  Cognition Arousal/Alertness: Lethargic Behavior During Therapy: Flat affect Overall Cognitive Status: Impaired/Different from baseline Area of Impairment:  Orientation;Attention;Following commands;Awareness;Problem solving Current Attention Level: Focused Following Commands: Follows one step commands with increased time Awareness: Intellectual Problem Solving: Slow processing;Decreased initiation;Requires verbal cues;Requires tactile cues    Extremity/Trunk Assessment Upper Extremity Assessment Upper Extremity Assessment: RUE deficits/detail RUE Deficits / Details: Brunstrom level 1. pre synergy. mod flexer tone RUE:  (no functional movement observed at this time; R scap retract) RUE Sensation: decreased light touch RUE Coordination: decreased fine motor;decreased gross motor Lower Extremity Assessment Lower Extremity Assessment: RLE deficits/detail (licking LLE on command. No functional movment observed oncom) Cervical / Trunk Assessment Cervical / Trunk Assessment: Kyphotic;Other exceptions (unable to achieve full upright posture in standing)     Mobility Bed Mobility Bed Mobility: Left Sidelying to Sit;Sitting - Scoot to Edge of Bed;Sit to Supine Left Sidelying to Sit: 2: Max assist;HOB elevated Sitting - Scoot to [place] of Bed: 2: Max assist Sit to Supine: 1: +2 Total assist Sit to Supine: Patient Percentage: 10% Transfers Transfers: Sit to Stand;Stand to Sit Sit to Stand: 1: +2 Total assist;From bed Sit to Stand: Patient Percentage: 30% Stand to Sit: To bed;1: +2 Total assist Stand to Sit: Patient Percentage: 30%     Exercise     Balance Balance Balance Assessed: Yes Static Sitting Balance Static Sitting - Balance Support: Feet supported;Bilateral upper extremity supported Static Sitting - Level of Assistance: 2: Max assist (posterior bias. R lean when tired. Able to maintian midline) Static Sitting - Comment/# of Minutes: 15 Static Standing Balance Static Standing - Balance Support: Bilateral upper extremity supported Static Standing - Level of Assistance: 1: +2 Total assist   End of Session OT - End of Session Equipment  Utilized During Treatment: Gait belt Activity Tolerance: Patient tolerated treatment well Patient left: in bed;with call bell/phone within reach Nurse Communication: Mobility status  GO     Treesa Mccully,HILLARY 06/27/2013, 4:39 PM [facility], OTR/L  [phone] 06/27/2013

## 2013-06-27 NOTE — Progress Notes (Signed)
UR completed.  Dailen Mcclish, RN BSN [facility] CCM Trauma/Neuro ICU Case Manager [phone]  

## 2013-06-27 NOTE — Progress Notes (Signed)
Stroke Team Progress Note  HISTORY Eric Oneill is an 79 y.o. male with a history of hypertension, hyperlipidemia, coronary artery disease, and Parkinson's disease, presenting with new onset speech difficulty and right-sided weakness. Onset was 8:30 PM. There is no previous history of stroke nor TIA. Patient has been taking aspirin 81 mg per day. CT scan of his head showed left basal ganglia acute intraparenchymal hemorrhage. There was no ventricular extension. NIH stroke score was 12.  LSN: 8:30 PM on 06/25/2013  tPA Given: No: ICH  MRankin: 3    Patient was not a TPA candidate secondary to ICH.   SUBJECTIVE No family at bedside, the patient is sleepy, can be aroused. The patient is attempting to follow commands.  OBJECTIVE Most recent Vital Signs: Filed Vitals:   06/27/13 0500 06/27/13 0600 06/27/13 0700 06/27/13 0800  BP: 172/68 161/68 172/65 180/72  Pulse: 60 55 57 57  Temp:      TempSrc:      Resp: 17 16 21 15   Height:      Weight:      SpO2: 98% 98% 98% 97%   CBG (last 3)   Recent Labs  06/25/13 2146  GLUCAP 103*    IV Fluid Intake:   . sodium chloride 75 mL/hr at 06/26/13 0416    MEDICATIONS  . pantoprazole (PROTONIX) IV  40 mg Intravenous QHS  . senna-docusate  1 tablet Oral BID   PRN:  acetaminophen, acetaminophen, labetalol  Diet:  NPO Activity:  Bedrest DVT Prophylaxis:  SCD  CLINICALLY SIGNIFICANT STUDIES Basic Metabolic Panel:   Recent Labs Lab 06/25/13 2129 06/25/13 2135 06/27/13 0340  NA 136 139 132*  K 4.3 3.9 3.8  CL 100 103 100  CO2 25  --  22  GLUCOSE 88 89 85  BUN 28* 30* 20  CREATININE 1.07 1.50* 0.90  CALCIUM 10.2  --  9.3   Liver Function Tests:   Recent Labs Lab 06/25/13 2129 06/27/13 0340  AST 21 29  ALT <5 16  ALKPHOS 57 47  BILITOT 0.5 1.4*  PROT 7.0 6.2  ALBUMIN 4.2 3.6   CBC:   Recent Labs Lab 06/25/13 2129 06/25/13 2135 06/27/13 0340  WBC 8.7  --  9.8  NEUTROABS 5.5  --  6.8  HGB 15.4 16.0 14.3   HCT 44.9 47.0 41.7  MCV 90.2  --  89.1  PLT 209  --  197   Coagulation:   Recent Labs Lab 06/25/13 2129  LABPROT 13.8  INR 1.08   Cardiac Enzymes:   Recent Labs Lab 06/25/13 2129  TROPONINI <0.30   Urinalysis:   Recent Labs Lab 06/26/13 0101  COLORURINE YELLOW  LABSPEC 1.016  PHURINE 6.5  GLUCOSEU NEGATIVE  HGBUR NEGATIVE  BILIRUBINUR NEGATIVE  KETONESUR NEGATIVE  PROTEINUR NEGATIVE  UROBILINOGEN 0.2  NITRITE NEGATIVE  LEUKOCYTESUR NEGATIVE   Lipid Panel No results found for this basename: chol,  trig,  hdl,  cholhdl,  vldl,  ldlcalc   HgbA1C  No results found for this basename: HGBA1C    Urine Drug Screen:     Component Value Date/Time   LABOPIA NONE DETECTED 06/26/2013 0101   COCAINSCRNUR NONE DETECTED 06/26/2013 0101   LABBENZ NONE DETECTED 06/26/2013 0101   AMPHETMU NONE DETECTED 06/26/2013 0101   THCU NONE DETECTED 06/26/2013 0101   LABBARB NONE DETECTED 06/26/2013 0101    Alcohol Level:   Recent Labs Lab 06/25/13 2129  ETH <11    Ct Head Wo Contrast  06/27/2013  CLINICAL DATA:  Followup intracranial hemorrhage.  EXAM: CT HEAD WITHOUT CONTRAST  TECHNIQUE: Contiguous axial images were obtained from the base of the skull through the vertex without intravenous contrast.  COMPARISON:  Prior CT from 06/26/2013  FINDINGS: Previously identified intraparenchymal seen hematoma involving the left basal ganglia is similar in size measuring 4.1 x 2.7 cm (series 2, image 20). Associated vasogenic edema is not significantly changed. 6 mm of left-to-right midline shift persists, unchanged. There is no hydrocephalus. No new hemorrhage identified. No extra-axial fluid collection. Basilar cisterns remain patent. No new acute intracranial infarct identified.  Atrophy with  Calvarium is intact. Orbits are normal. Paranasal sinuses and mastoid air cells remain clear.  IMPRESSION: No significant interval change in size of left basal ganglia intraparenchymal hemorrhage  with stable 6 mm of left-to-right midline shift. No hydrocephalus. No new hemorrhage.   Electronically Signed   By: Rise Mu M.D.   On: 06/27/2013 05:21   Ct Head Wo Contrast  06/26/2013   CLINICAL DATA:  Evaluate for worsening bleed, mental status changes  EXAM: CT HEAD WITHOUT CONTRAST  TECHNIQUE: Contiguous axial images were obtained from the base of the skull through the vertex without intravenous contrast.  COMPARISON:  06/25/2013  FINDINGS: No skull fracture is noted. Paranasal sinuses and mastoid air cells are unremarkable. There is increased in size in left basal ganglia hematoma measures 4.2 by 2.9 cm on the prior exam measures 2.4 by 2.2 cm. Again noted surrounding vasogenic edema. There is worsening left to right midline shift about 6 mm on the prior exam was 3 mm.  No hydrocephalus.  No intraventricular hemorrhage.  IMPRESSION: There is increased in size in left basal ganglia hematoma measures 4.2 by 2.9 cm on the prior exam measures 2.4 by 2.2 cm. Again noted surrounding vasogenic edema. There is worsening left to right midline shift about 6 mm on the prior exam was 3 mm.  Critical findings discussed with patient's nurse Joni Reining from Surgical ICU   Electronically Signed   By: Natasha Mead M.D.   On: 06/26/2013 11:13   Ct Head Wo Contrast  06/25/2013   CLINICAL DATA:  Code stroke.  Fall with right facial droop.  EXAM: CT HEAD WITHOUT CONTRAST  TECHNIQUE: Contiguous axial images were obtained from the base of the skull through the vertex without intravenous contrast.  COMPARISON:  MR brain 02/21/2013.  FINDINGS: There is a rounded area of high attenuation in the left basal ganglia, measuring 2.2 x 2.4 cm, with a thin rim of surrounding low-attenuation edema. Approximately 3 mm of left-to-right midline shift. No additional evidence of an acute infarct, mass lesion or hydrocephalus. Atrophy. Periventricular low attenuation. Scattered mucosal thickening in the paranasal sinuses. No air-fluid  levels. Mastoid air cells are clear.  IMPRESSION: 1. Left basal ganglia hematoma with 3 mm left-to-right midline shift. Critical Value/emergent results were called by telephone at the time of interpretation on 06/25/2013 at 9:45 PM to Dr.Stewart, who verbally acknowledged these results. 2. Atrophy and chronic microvascular white matter ischemic changes.   Electronically Signed   By: Leanna Battles M.D.   On: 06/25/2013 21:46    CT of the brain   IMPRESSION:  1. Left basal ganglia hematoma with 3 mm left-to-right midline  shift. Critical Value/emergent results were called by telephone at  the time of interpretation on 06/25/2013 at 9:45 PM to Dr.Stewart,  who verbally acknowledged these results.  2. Atrophy and chronic microvascular white matter ischemic changes.  CT head 06/27/13  IMPRESSION:  No significant interval change in size of left basal ganglia  intraparenchymal hemorrhage with stable 6 mm of left-to-right  midline shift. No hydrocephalus. No new hemorrhage.    MRI of the brain    MRA of the brain    2D Echocardiogram    Carotid Doppler    CXR    EKG   Sinus rhythm Multiform ventricular premature complexes Prolonged PR interval Right bundle branch block  Therapy Recommendations Pending  Physical Exam  General: The patient is sleepy, but can be aroused.  Respiratory: Lung fields are clear.  Cardiovascular: There is a regular rate and rhythm, no obvious murmurs or rubs are noted.  Abdomen: Soft nontender, positive bowel sounds.  Skin: No significant peripheral edema is noted.   Neurologic Exam  Mental status: The patient is arousable, will follow commands.  Cranial nerves: Facial symmetry is not resent, with depression of the right nasolabial fold. Gaze preference is to the left, but the patient is able to deviate the eyes to the right. The patient will blink to threat from the left, minimal blink on the right. The patient is minimally verbal, will  occasionally attempt to whisper some words. Severely dysarthric and dysphonic.  Motor: The patient has a right hemiparesis, arm greater than leg. The patient moves the left side.  Sensory examination: The patient has some response to deep pain stimulation on all fours.  Coordination: There does not appear to be dysmetria with the use of the left arm and leg, weakness with the right side. Resting tremors noted on the left arm.  Gait and station: The gait cannot be tested.  Reflexes: Deep tendon reflexes are symmetric, slightly depressed. Bilateral Babinski's noted, more prominent on the right.    ASSESSMENT Mr. Eric Oneill is a 79 y.o. male presenting with left BG ICH. No intraventricular extension. Patient was on aspirin prior to admission. There is a pre-existing history of Parkinson's disease and coronary artery disease. The patient has a right hemiparesis, left gaze preference. Some worsening of mental status overnight. The patient is not a TPA candidate secondary to hemorrhage.   Parkinson's disease  Left BG ICH  Dyslipidemia  Coronary artery disease  Hospital day # 2  CT the head that was done today shows stability from the scan done yesterday. The patient is quite dysarthric, not clearly understanding language completely. The patient is trying to verbalize, will attempt to follow commands.  TREATMENT/PLAN  Hold aspirin   Supportive care  Physical and occupational, speech therapy evaluation when appropriate  Will need to restart Parkinson's medications as soon as possible  Repeat ct head in am, CT scan done today is stable  Follow mental status  Will keep the patient n.p.o. today, consider tube feeding tomorrow for medications and nutrition.   06/27/2013 8:28 AM  Lesly Dukes

## 2013-06-27 NOTE — Progress Notes (Signed)
Rehab Admissions Coordinator Note:  Patient was screened by Eric Oneill for appropriateness for an Inpatient Acute Rehab Consult.  At this time, we are recommending Inpatient Rehab consult.  Eric Oneill 06/27/2013, 7:29 PM  [phone]

## 2013-06-28 ENCOUNTER — Inpatient Hospital Stay (HOSPITAL_COMMUNITY): Payer: Medicare Other

## 2013-06-28 LAB — BASIC METABOLIC PANEL
BUN: 24 mg/dL — ABNORMAL HIGH (ref 6–23)
Calcium: 9.7 mg/dL (ref 8.4–10.5)
GFR calc Af Amer: >90 mL/min (ref >90)
GFR calc non Af Amer: 80 mL/min — ABNORMAL LOW (ref >90)
Glucose, Bld: 110 mg/dL — ABNORMAL HIGH (ref 70–99)
Potassium: 3.8 mEq/L (ref 3.5–5.1)

## 2013-06-28 LAB — GLUCOSE, CAPILLARY
Glucose-Capillary: 106 mg/dL — ABNORMAL HIGH (ref 70–99)
Glucose-Capillary: 122 mg/dL — ABNORMAL HIGH (ref 70–99)

## 2013-06-28 LAB — URINALYSIS W MICROSCOPIC + REFLEX CULTURE
Bilirubin Urine: NEGATIVE
Glucose, UA: NEGATIVE mg/dL
Ketones, ur: >80 mg/dL — AB
Leukocytes, UA: NEGATIVE
Protein, ur: 30 mg/dL — AB

## 2013-06-28 MED ORDER — JEVITY 1.2 CAL PO LIQD
1000.0000 mL | ORAL | Status: DC
  Administered 2013-06-28: 1000 mL
  Filled 2013-06-28: qty 1000

## 2013-06-28 MED ORDER — SODIUM CHLORIDE 0.9 % IV BOLUS (SEPSIS)
500.0000 mL | Freq: Once | INTRAVENOUS | Status: AC
  Administered 2013-06-28: 500 mL via INTRAVENOUS

## 2013-06-28 MED ORDER — LISINOPRIL 10 MG PO TABS
10.0000 mg | ORAL_TABLET | Freq: Every day | ORAL | Status: DC
  Administered 2013-06-28: 10 mg via NASOGASTRIC
  Filled 2013-06-28: qty 1

## 2013-06-28 MED ORDER — CARBIDOPA-LEVODOPA 25-100 MG PO TABS
1.5000 | ORAL_TABLET | Freq: Four times a day (QID) | ORAL | Status: DC
  Administered 2013-06-28 – 2013-07-04 (×19): 1.5 via NASOGASTRIC
  Filled 2013-06-28 (×31): qty 1.5

## 2013-06-28 NOTE — Progress Notes (Signed)
INITIAL NUTRITION ASSESSMENT  DOCUMENTATION CODES Per approved criteria  -Not Applicable   INTERVENTION:  Recommend initiate Jevity 1.2 via NGT at 20 ml/h, increase by 10 ml every 4 hours to goal rate of 70 ml/h to provide 2016 kcals, 93 gm protein, 1361 ml free water daily.  NUTRITION DIAGNOSIS: Inadequate oral intake related to difficulty swallowing as evidenced by NPO status.   Goal: Intake to meet >90% of estimated nutrition needs.  Monitor:  TF tolerance/adequacy, weight trend, labs, swallowing function.  Reason for Assessment: New TF  79 y.o. male  Admitting Dx: Acute left basal ganglia intraparenchymal hemorrhage   ASSESSMENT: Patient is an 79 y.o. male with a history of hypertension, hyperlipidemia, coronary artery disease, and Parkinson's disease, presenting with new onset speech difficulty and right-sided weakness. CT scan of his head showed left basal ganglia acute intraparenchymal hemorrhage.   SLP has been following patient for swallow evaluation; patient has been too lethargic to safely attempt PO's. Decision made today to place enteral feeding tube and start TF. Suspect patient was well-nourished PTA with stable weight and good intake. Nutrition focused physical exam completed.  No muscle or subcutaneous fat depletion noticed.  Height: Ht Readings from Last 1 Encounters:  06/26/13 5\' 9"  (1.753 m)    Weight: Wt Readings from Last 1 Encounters:  06/26/13 195 lb 5.2 oz (88.6 kg)    Ideal Body Weight: 72.7 kg  % Ideal Body Weight: 122%  Wt Readings from Last 10 Encounters:  06/26/13 195 lb 5.2 oz (88.6 kg)  06/09/13 198 lb 4.8 oz (89.948 kg)  04/07/13 192 lb 12.8 oz (87.454 kg)  02/18/13 188 lb (85.276 kg)    Usual Body Weight: 188-198 lb  % Usual Body Weight: 100%  BMI:  Body mass index is 28.83 kg/(m^2).  Estimated Nutritional Needs: Kcal: 1800-2000 Protein: 90-105 gm Fluid: 1.8-2 L  Skin: no wounds  Diet Order: NPO  EDUCATION  NEEDS: -Education not appropriate at this time   Intake/Output Summary (Last 24 hours) at 06/28/13 1438 Last data filed at 06/28/13 1339  Gross per 24 hour  Intake   1650 ml  Output   1965 ml  Net   -315 ml    Last BM: None documented since admission  Labs:   Recent Labs Lab 06/25/13 2129 06/25/13 2135 06/27/13 0340 06/28/13 0431  NA 136 139 132* 133*  K 4.3 3.9 3.8 3.8  CL 100 103 100 98  CO2 25  --  22 19  BUN 28* 30* 20 24*  CREATININE 1.07 1.50* 0.90 0.83  CALCIUM 10.2  --  9.3 9.7  GLUCOSE 88 89 85 110*    CBG (last 3)   Recent Labs  06/25/13 2146 06/28/13 0726 06/28/13 1227  GLUCAP 103* 93 106*    Scheduled Meds: . antiseptic oral rinse  15 mL Mouth Rinse q12n4p  . carbidopa-levodopa  1.5 tablet Per NG tube Q6H  . chlorhexidine  15 mL Mouth Rinse BID  . cloNIDine  0.1 mg Transdermal Weekly  . lisinopril  10 mg Per NG tube Daily  . pantoprazole (PROTONIX) IV  40 mg Intravenous QHS  . senna-docusate  1 tablet Oral BID    Continuous Infusions: . sodium chloride 75 mL/hr at 06/27/13 2352  . feeding supplement (JEVITY 1.2 CAL) 1,000 mL (06/28/13 1339)    Past Medical History  Diagnosis Date  . Coronary artery disease   . Parkinson disease     dx: 06/2007  . Depression  Past Surgical History  Procedure Laterality Date  . Coronary artery bypass graft    . Hernia repair      x2  . Circumcision  Aug 2002  . Carpal tunnel release      L  . Knee surgery      bilateral    Joaquin Courts, RD, LDN, CNSC Pager [phone] After Hours Pager [phone]

## 2013-06-28 NOTE — Progress Notes (Signed)
Stroke Team Progress Note  HISTORY Eric Oneill is an 79 y.o. male with a history of hypertension, hyperlipidemia, coronary artery disease, and Parkinson's disease, presenting with new onset speech difficulty and right-sided weakness. Onset was 8:30 PM. There is no previous history of stroke nor TIA. Patient has been taking aspirin 81 mg per day. CT scan of his head showed left basal ganglia acute intraparenchymal hemorrhage. There was no ventricular extension. NIH stroke score was 12.  LSN: 8:30 PM on 06/25/2013  tPA Given: No: ICH  MRankin: 3    Patient was not a TPA candidate secondary to ICH.   SUBJECTIVE Family is at bedside, the patient is sleepy, can be aroused. The patient is attempting to follow commands.  OBJECTIVE Most recent Vital Signs: Filed Vitals:   06/28/13 0600 06/28/13 0630 06/28/13 0700 06/28/13 0800  BP: 173/82 172/78 169/78 188/85  Pulse: 66 69 64 64  Temp:      TempSrc:      Resp: 19 21 17 19   Height:      Weight:      SpO2: 99% 100% 99% 99%   CBG (last 3)   Recent Labs  06/25/13 2146 06/28/13 0726  GLUCAP 103* 93    IV Fluid Intake:   . sodium chloride 75 mL/hr at 06/27/13 2352    MEDICATIONS  . antiseptic oral rinse  15 mL Mouth Rinse q12n4p  . chlorhexidine  15 mL Mouth Rinse BID  . cloNIDine  0.1 mg Transdermal Weekly  . pantoprazole (PROTONIX) IV  40 mg Intravenous QHS  . senna-docusate  1 tablet Oral BID   PRN:  acetaminophen, acetaminophen, hydrALAZINE, labetalol  Diet:  NPO will start TF. Activity:  Bedrest DVT Prophylaxis:  SCD  CLINICALLY SIGNIFICANT STUDIES Basic Metabolic Panel:   Recent Labs Lab 06/27/13 0340 06/28/13 0431  NA 132* 133*  K 3.8 3.8  CL 100 98  CO2 22 19  GLUCOSE 85 110*  BUN 20 24*  CREATININE 0.90 0.83  CALCIUM 9.3 9.7   Liver Function Tests:   Recent Labs Lab 06/25/13 2129 06/27/13 0340  AST 21 29  ALT <5 16  ALKPHOS 57 47  BILITOT 0.5 1.4*  PROT 7.0 6.2  ALBUMIN 4.2 3.6   CBC:    Recent Labs Lab 06/25/13 2129 06/25/13 2135 06/27/13 0340  WBC 8.7  --  9.8  NEUTROABS 5.5  --  6.8  HGB 15.4 16.0 14.3  HCT 44.9 47.0 41.7  MCV 90.2  --  89.1  PLT 209  --  197   Coagulation:   Recent Labs Lab 06/25/13 2129  LABPROT 13.8  INR 1.08   Cardiac Enzymes:   Recent Labs Lab 06/25/13 2129  TROPONINI <0.30   Urinalysis:   Recent Labs Lab 06/26/13 0101  COLORURINE YELLOW  LABSPEC 1.016  PHURINE 6.5  GLUCOSEU NEGATIVE  HGBUR NEGATIVE  BILIRUBINUR NEGATIVE  KETONESUR NEGATIVE  PROTEINUR NEGATIVE  UROBILINOGEN 0.2  NITRITE NEGATIVE  LEUKOCYTESUR NEGATIVE   Lipid Panel No results found for this basename: chol,  trig,  hdl,  cholhdl,  vldl,  ldlcalc   HgbA1C  No results found for this basename: HGBA1C    Urine Drug Screen:     Component Value Date/Time   LABOPIA NONE DETECTED 06/26/2013 0101   COCAINSCRNUR NONE DETECTED 06/26/2013 0101   LABBENZ NONE DETECTED 06/26/2013 0101   AMPHETMU NONE DETECTED 06/26/2013 0101   THCU NONE DETECTED 06/26/2013 0101   LABBARB NONE DETECTED 06/26/2013 0101  Alcohol Level:   Recent Labs Lab 06/25/13 2129  ETH <11    Ct Head Wo Contrast  06/28/2013   CLINICAL DATA:  Followup intracranial hemorrhage  EXAM: CT HEAD WITHOUT CONTRAST  TECHNIQUE: Contiguous axial images were obtained from the base of the skull through the vertex without intravenous contrast.  COMPARISON:  Prior CT from 06/27/2013  FINDINGS: Left basal ganglia intraparenchymal hematoma is similar in size measuring 4.2 x 2.5 cm (series 2, image 18, previously 4.1 x 2.7 cm). Associated vasogenic edema is not significantly changed. There remains partial effacement of the left lateral ventricle with 5 mm of left-to-right midline shift, also similar. No new intracranial hemorrhage or evidence of intraventricular extension. No evidence of developing hydrocephalus.  Atrophy with chronic microvascular ischemic changes are unchanged. Calvarium remains  intact.  Paranasal sinuses and mastoid air cells remain clear.  IMPRESSION: No significant interval change in size of left basal ganglia intraparenchymal hemorrhage with similar 5 mm of left-to-right midline shift. No new intracranial hemorrhage or hydrocephalus identified.   Electronically Signed   By: Rise Mu M.D.   On: 06/28/2013 04:34   Ct Head Wo Contrast  06/27/2013   CLINICAL DATA:  Followup intracranial hemorrhage.  EXAM: CT HEAD WITHOUT CONTRAST  TECHNIQUE: Contiguous axial images were obtained from the base of the skull through the vertex without intravenous contrast.  COMPARISON:  Prior CT from 06/26/2013  FINDINGS: Previously identified intraparenchymal seen hematoma involving the left basal ganglia is similar in size measuring 4.1 x 2.7 cm (series 2, image 20). Associated vasogenic edema is not significantly changed. 6 mm of left-to-right midline shift persists, unchanged. There is no hydrocephalus. No new hemorrhage identified. No extra-axial fluid collection. Basilar cisterns remain patent. No new acute intracranial infarct identified.  Atrophy with  Calvarium is intact. Orbits are normal. Paranasal sinuses and mastoid air cells remain clear.  IMPRESSION: No significant interval change in size of left basal ganglia intraparenchymal hemorrhage with stable 6 mm of left-to-right midline shift. No hydrocephalus. No new hemorrhage.   Electronically Signed   By: Rise Mu M.D.   On: 06/27/2013 05:21   Ct Head Wo Contrast  06/26/2013   CLINICAL DATA:  Evaluate for worsening bleed, mental status changes  EXAM: CT HEAD WITHOUT CONTRAST  TECHNIQUE: Contiguous axial images were obtained from the base of the skull through the vertex without intravenous contrast.  COMPARISON:  06/25/2013  FINDINGS: No skull fracture is noted. Paranasal sinuses and mastoid air cells are unremarkable. There is increased in size in left basal ganglia hematoma measures 4.2 by 2.9 cm on the prior exam  measures 2.4 by 2.2 cm. Again noted surrounding vasogenic edema. There is worsening left to right midline shift about 6 mm on the prior exam was 3 mm.  No hydrocephalus.  No intraventricular hemorrhage.  IMPRESSION: There is increased in size in left basal ganglia hematoma measures 4.2 by 2.9 cm on the prior exam measures 2.4 by 2.2 cm. Again noted surrounding vasogenic edema. There is worsening left to right midline shift about 6 mm on the prior exam was 3 mm.  Critical findings discussed with patient's nurse Joni Reining from Surgical ICU   Electronically Signed   By: Natasha Mead M.D.   On: 06/26/2013 11:13    CT of the brain   IMPRESSION:  1. Left basal ganglia hematoma with 3 mm left-to-right midline  shift. Critical Value/emergent results were called by telephone at  the time of interpretation on 06/25/2013 at 9:45 PM to  Dr.Stewart,  who verbally acknowledged these results.  2. Atrophy and chronic microvascular white matter ischemic changes.  CT head 06/27/13  IMPRESSION:  No significant interval change in size of left basal ganglia  intraparenchymal hemorrhage with stable 6 mm of left-to-right  midline shift. No hydrocephalus. No new hemorrhage.    MRI of the brain    MRA of the brain    2D Echocardiogram    Carotid Doppler    CXR    EKG   Sinus rhythm Multiform ventricular premature complexes Prolonged PR interval Right bundle branch block  Therapy Recommendations Pending  Physical Exam  General: The patient is sleepy, but can be aroused.  Respiratory: Lung fields are clear.  Cardiovascular: There is a regular rate and rhythm, no obvious murmurs or rubs are noted.  Abdomen: Soft nontender, positive bowel sounds.  Skin: No significant peripheral edema is noted.   Neurologic Exam  Mental status: The patient is arousable, will follow commands.  Cranial nerves: Facial symmetry is not resent, with depression of the right nasolabial fold. Gaze preference is to the  left, but the patient is able to deviate the eyes to the right. The patient will blink to threat from the left, minimal blink on the right. The patient is minimally verbal, will occasionally attempt to whisper some words. Severely dysarthric and dysphonic.  Motor: The patient has a right hemiparesis, arm greater than leg. The patient moves the left side.  Sensory examination: The patient has some response to deep pain stimulation on all fours.  Coordination: There does not appear to be dysmetria with the use of the left arm and leg, weakness with the right side. Resting tremors noted on the left arm.  Gait and station: The gait cannot be tested.  Reflexes: Deep tendon reflexes are symmetric, slightly depressed. Bilateral Babinski's noted, more prominent on the right.    ASSESSMENT Mr. NANCY MANUELE is a 79 y.o. male presenting with left BG ICH. No intraventricular extension. Patient was on aspirin prior to admission. There is a pre-existing history of Parkinson's disease and coronary artery disease. The patient has a right hemiparesis, left gaze preference. Some worsening of mental status overnight. The patient is not a TPA candidate secondary to hemorrhage.   Parkinson's disease  Left BG ICH  Dyslipidemia  Coronary artery disease  Hospital day # 3  CT the head that was done today shows stability from the scan done yesterday. The patient is quite dysarthric, not clearly understanding language completely. The patient is trying to verbalize, will attempt to follow commands.  TREATMENT/PLAN  Hold aspirin   Supportive care  Rehab consult  Will need to restart Parkinson's medications as soon as possible  Follow mental status   tube feeding for medications and nutrition.   06/28/2013 8:46 AM  Lesly Dukes

## 2013-06-28 NOTE — Progress Notes (Signed)
SLP Cancellation Note  Patient Details Name: Eric Oneill MRN: [id number] DOB: 1928/03/24   Cancelled treatment:       Reason Eval/Treat Not Completed: Other (comment). Will defer diagnostic PO trials as pt is still lethargic and will now have alternate source of nutrition. Hopeful that with meds pts function may improve. Will f/u on Monday.   Ebonie Westerlund, Riley Nearing 06/28/2013, 11:55 AM

## 2013-06-29 LAB — CBC
MCHC: 34.5 g/dL (ref 30.0–36.0)
Platelets: 201 10*3/uL (ref 150–400)
RBC: 4.63 MIL/uL (ref 4.22–5.81)
WBC: 9.3 10*3/uL (ref 4.0–10.5)

## 2013-06-29 LAB — GLUCOSE, CAPILLARY
Glucose-Capillary: 125 mg/dL — ABNORMAL HIGH (ref 70–99)
Glucose-Capillary: 106 mg/dL — ABNORMAL HIGH (ref 70–99)
Glucose-Capillary: 123 mg/dL — ABNORMAL HIGH (ref 70–99)
Glucose-Capillary: 112 mg/dL — ABNORMAL HIGH (ref 70–99)

## 2013-06-29 LAB — BASIC METABOLIC PANEL
BUN: 39 mg/dL — ABNORMAL HIGH (ref 6–23)
CO2: 21 mEq/L (ref 19–32)
Chloride: 102 mEq/L (ref 96–112)
Potassium: 3.5 mEq/L (ref 3.5–5.1)
Sodium: 133 mEq/L — ABNORMAL LOW (ref 135–145)

## 2013-06-29 MED ORDER — FREE WATER
300.0000 mL | Freq: Four times a day (QID) | Status: DC
  Administered 2013-06-29 – 2013-07-04 (×17): 300 mL

## 2013-06-29 MED ORDER — JEVITY 1.2 CAL PO LIQD
1000.0000 mL | ORAL | Status: DC
  Administered 2013-06-29 – 2013-07-01 (×3): 1000 mL
  Filled 2013-06-29 (×12): qty 1000

## 2013-06-29 NOTE — Progress Notes (Signed)
Stroke Team Progress Note  HISTORY Eric Oneill is an 79 y.o. male with a history of hypertension, hyperlipidemia, coronary artery disease, and Parkinson's disease, presenting with new onset speech difficulty and right-sided weakness. Onset was 8:30 PM. There is no previous history of stroke nor TIA. Patient has been taking aspirin 81 mg per day. CT scan of his head showed left basal ganglia acute intraparenchymal hemorrhage. There was no ventricular extension. NIH stroke score was 12.  LSN: 8:30 PM on 06/25/2013  tPA Given: No: ICH  MRankin: 3    Patient was not a TPA candidate secondary to ICH.   SUBJECTIVE Family is at bedside, the patient is sleepy, can be aroused. The patient is attempting to follow commands.  OBJECTIVE Most recent Vital Signs: Filed Vitals:   06/29/13 0530 06/29/13 0600 06/29/13 0700 06/29/13 0800  BP: 110/49 117/50 114/47 95/40  Pulse: 48 49 50 49  Temp:    97.9 F (36.6 C)  TempSrc:    Axillary  Resp: 16 13 17 17   Height:      Weight:      SpO2: 97% 98% 99% 97%   CBG (last 3)   Recent Labs  06/28/13 2350 06/29/13 0326 06/29/13 0724  GLUCAP 111* 125* 128*    IV Fluid Intake:   . sodium chloride 1,000 mL (06/29/13 0424)  . feeding supplement (JEVITY 1.2 CAL)      MEDICATIONS  . antiseptic oral rinse  15 mL Mouth Rinse q12n4p  . carbidopa-levodopa  1.5 tablet Per NG tube Q6H  . chlorhexidine  15 mL Mouth Rinse BID  . free water  300 mL Per Tube Q6H  . pantoprazole (PROTONIX) IV  40 mg Intravenous QHS  . senna-docusate  1 tablet Oral BID   PRN:  acetaminophen, acetaminophen, hydrALAZINE, labetalol  Diet:  NPO will start TF. Activity:  Bedrest DVT Prophylaxis:  SCD  CLINICALLY SIGNIFICANT STUDIES Basic Metabolic Panel:   Recent Labs Lab 06/28/13 0431 06/29/13 0455  NA 133* 133*  K 3.8 3.5  CL 98 102  CO2 19 21  GLUCOSE 110* 125*  BUN 24* 39*  CREATININE 0.83 0.83  CALCIUM 9.7 9.4   Liver Function Tests:   Recent  Labs Lab 06/25/13 2129 06/27/13 0340  AST 21 29  ALT <5 16  ALKPHOS 57 47  BILITOT 0.5 1.4*  PROT 7.0 6.2  ALBUMIN 4.2 3.6   CBC:   Recent Labs Lab 06/25/13 2129  06/27/13 0340 06/29/13 0455  WBC 8.7  --  9.8 9.3  NEUTROABS 5.5  --  6.8  --   HGB 15.4  < > 14.3 13.9  HCT 44.9  < > 41.7 40.3  MCV 90.2  --  89.1 87.0  PLT 209  --  197 201  < > = values in this interval not displayed. Coagulation:   Recent Labs Lab 06/25/13 2129  LABPROT 13.8  INR 1.08   Cardiac Enzymes:   Recent Labs Lab 06/25/13 2129  TROPONINI <0.30   Urinalysis:   Recent Labs Lab 06/26/13 0101 06/28/13 0904  COLORURINE YELLOW YELLOW  LABSPEC 1.016 1.022  PHURINE 6.5 5.5  GLUCOSEU NEGATIVE NEGATIVE  HGBUR NEGATIVE SMALL*  BILIRUBINUR NEGATIVE NEGATIVE  KETONESUR NEGATIVE >80*  PROTEINUR NEGATIVE 30*  UROBILINOGEN 0.2 0.2  NITRITE NEGATIVE NEGATIVE  LEUKOCYTESUR NEGATIVE NEGATIVE   Lipid Panel No results found for this basename: chol,  trig,  hdl,  cholhdl,  vldl,  ldlcalc   HgbA1C  No results found for this  basename: HGBA1C    Urine Drug Screen:     Component Value Date/Time   LABOPIA NONE DETECTED 06/26/2013 0101   COCAINSCRNUR NONE DETECTED 06/26/2013 0101   LABBENZ NONE DETECTED 06/26/2013 0101   AMPHETMU NONE DETECTED 06/26/2013 0101   THCU NONE DETECTED 06/26/2013 0101   LABBARB NONE DETECTED 06/26/2013 0101    Alcohol Level:   Recent Labs Lab 06/25/13 2129  ETH <11    Ct Head Wo Contrast  06/28/2013   CLINICAL DATA:  Followup intracranial hemorrhage  EXAM: CT HEAD WITHOUT CONTRAST  TECHNIQUE: Contiguous axial images were obtained from the base of the skull through the vertex without intravenous contrast.  COMPARISON:  Prior CT from 06/27/2013  FINDINGS: Left basal ganglia intraparenchymal hematoma is similar in size measuring 4.2 x 2.5 cm (series 2, image 18, previously 4.1 x 2.7 cm). Associated vasogenic edema is not significantly changed. There remains  partial effacement of the left lateral ventricle with 5 mm of left-to-right midline shift, also similar. No new intracranial hemorrhage or evidence of intraventricular extension. No evidence of developing hydrocephalus.  Atrophy with chronic microvascular ischemic changes are unchanged. Calvarium remains intact.  Paranasal sinuses and mastoid air cells remain clear.  IMPRESSION: No significant interval change in size of left basal ganglia intraparenchymal hemorrhage with similar 5 mm of left-to-right midline shift. No new intracranial hemorrhage or hydrocephalus identified.   Electronically Signed   By: Rise Mu M.D.   On: 06/28/2013 04:34   Dg Chest Port 1 View  06/28/2013   CLINICAL DATA:  Fever.  EXAM: PORTABLE CHEST - 1 VIEW  COMPARISON:  09/03/2009.  FINDINGS: Prior CABG. Cardiomegaly. Normal pulmonary vascularity. Lungs clear. No pleural effusion pneumothorax. No acute osseous abnormality.  IMPRESSION: Prior CABG. Stable cardiomegaly. No CHF. No acute cardiopulmonary disease.   Electronically Signed   By: Maisie Fus  Register   On: 06/28/2013 11:11   Dg Abd Portable 1v  06/28/2013   CLINICAL DATA:  Evaluate feeding tube placement.  EXAM: PORTABLE ABDOMEN - 1 VIEW  COMPARISON:  Chest radiograph 06/28/2013  FINDINGS: There is feeding tube tip in the right upper abdomen. The tip is probably in the region of the duodenum bulb. Calcifications in the aorta. Degenerative changes in the lower lumbar spine. Nonspecific bowel gas pattern.  IMPRESSION: Feeding tube tip is in the region of the proximal duodenum.   Electronically Signed   By: Richarda Overlie M.D.   On: 06/28/2013 12:40    CT of the brain   IMPRESSION:  1. Left basal ganglia hematoma with 3 mm left-to-right midline  shift. Critical Value/emergent results were called by telephone at  the time of interpretation on 06/25/2013 at 9:45 PM to Dr.Stewart,  who verbally acknowledged these results.  2. Atrophy and chronic microvascular white  matter ischemic changes.  CT head 06/27/13  IMPRESSION:  No significant interval change in size of left basal ganglia  intraparenchymal hemorrhage with stable 6 mm of left-to-right  midline shift. No hydrocephalus. No new hemorrhage.    MRI of the brain    MRA of the brain    2D Echocardiogram    Carotid Doppler    CXR    EKG   Sinus rhythm Multiform ventricular premature complexes Prolonged PR interval Right bundle branch block  Therapy Recommendations Pending  Physical Exam  General: The patient is sleepy, but can be aroused.  Respiratory: Lung fields are clear.  Cardiovascular: There is a regular rate and rhythm, no obvious murmurs or rubs are noted.  Abdomen: Soft nontender, positive bowel sounds.  Skin: No significant peripheral edema is noted.   Neurologic Exam  Mental status: The patient is arousable, will follow commands.  Cranial nerves: Facial symmetry is not resent, with depression of the right nasolabial fold. Gaze preference is to the left, but the patient is able to deviate the eyes to the right. The patient will blink to threat from the left, minimal blink on the right. The patient is minimally verbal, will occasionally attempt to whisper some words. Severely dysarthric and dysphonic.  Motor: The patient has a right hemiparesis, arm greater than leg. The patient moves the left side.  Sensory examination: The patient has some response to deep pain stimulation on all fours.  Coordination: There does not appear to be dysmetria with the use of the left arm and leg, weakness with the right side. Resting tremors noted on the left arm.  Gait and station: The gait cannot be tested.  Reflexes: Deep tendon reflexes are symmetric, slightly depressed. Bilateral Babinski's noted, more prominent on the right.    ASSESSMENT Eric Oneill is a 78 y.o. male presenting with left BG ICH. No intraventricular extension. Patient was on aspirin prior to  admission. There is a pre-existing history of Parkinson's disease and coronary artery disease. The patient has a right hemiparesis, left gaze preference. Some worsening of mental status overnight. The patient is not a TPA candidate secondary to hemorrhage.   Parkinson's disease  Left BG ICH  Dyslipidemia  Coronary artery disease  Hospital day # 4  CT the head that was done today shows stability from the scan done yesterday. The patient is quite dysarthric, not clearly understanding language completely. The patient is trying to verbalize, will attempt to follow commands. BP meds discontinued after sinemet was started, as BP has dropped significantly.  TREATMENT/PLAN  Hold aspirin   Supportive care  Rehab consult  Will need to restart Parkinson's medications as soon as possible  Follow mental status   tube feeding for medications and nutrition started  CT head in am   06/29/2013 8:23 AM  Lesly Dukes

## 2013-06-30 ENCOUNTER — Inpatient Hospital Stay (HOSPITAL_COMMUNITY): Payer: Medicare Other

## 2013-06-30 ENCOUNTER — Encounter (HOSPITAL_COMMUNITY): Payer: Self-pay | Admitting: Radiology

## 2013-06-30 DIAGNOSIS — G8110 Spastic hemiplegia affecting unspecified side: Secondary | ICD-10-CM

## 2013-06-30 DIAGNOSIS — I619 Nontraumatic intracerebral hemorrhage, unspecified: Secondary | ICD-10-CM

## 2013-06-30 LAB — GLUCOSE, CAPILLARY
Glucose-Capillary: 105 mg/dL — ABNORMAL HIGH (ref 70–99)
Glucose-Capillary: 109 mg/dL — ABNORMAL HIGH (ref 70–99)
Glucose-Capillary: 114 mg/dL — ABNORMAL HIGH (ref 70–99)

## 2013-06-30 NOTE — Clinical Documentation Improvement (Signed)
THIS DOCUMENT IS NOT A PERMANENT PART OF THE MEDICAL RECORD  Please update your documentation with the medical record to reflect your response to this query. If you need help knowing how to do this please call [phone].  06/30/13   Dear Guy Franco PA,  CT Head 11/26 gives "surrounding vasogenic edema" which is a significant finding. If you agree with this finding please add to documentation in Notes and DC summary to clarify this patient's severity of illness and risk of mortality. Thank you.   You may use possible, probable, or suspect with inpatient documentation. possible, probable, suspected diagnoses MUST be documented at the time of discharge  Reviewed: additional documentation in the medical record  Thank You,  Beverley Fiedler  Clinical Documentation Specialist: [phone] Health Information Management [place]

## 2013-06-30 NOTE — Progress Notes (Signed)
Patient pulled his NG tube and will replace a new one. Charged notified. Will continue to monitor.\[id number]\

## 2013-06-30 NOTE — Progress Notes (Signed)
I met with two daughters and pt at bedside. Discussed inpt rehab venue as an option for his rehab recovery. I will begin insurance approval to hopefully admit tomorrow. They are in agreement. [phone]

## 2013-06-30 NOTE — Progress Notes (Signed)
Stroke Team Progress Note  HISTORY Eric Oneill is an 79 y.o. male with a history of hypertension, hyperlipidemia, coronary artery disease, and Parkinson's disease, presenting with new onset speech difficulty and right-sided weakness. Onset was 8:30 PM. There is no previous history of stroke nor TIA. Patient has been taking aspirin 81 mg per day. CT scan of his head showed left basal ganglia acute intraparenchymal hemorrhage. There was no ventricular extension. NIH stroke score was 12.   LSN: 8:30 PM on 06/25/2013  tPA Given: No: ICH  MRankin: 3    Patient was not a TPA candidate secondary to ICH.   SUBJECTIVE Expressive aphasia. Following commands. No family in room.   OBJECTIVE Most recent Vital Signs: Filed Vitals:   06/30/13 0400 06/30/13 0500 06/30/13 0600 06/30/13 0700  BP: 166/65 142/52 135/55 121/40  Pulse: 61 56 56 56  Temp:      TempSrc:      Resp: 23 15 19 17   Height:      Weight:      SpO2: 100% 99% 99% 95%   CBG (last 3)   Recent Labs  06/29/13 2001 06/29/13 2336 06/30/13 0351  GLUCAP 112* 109* 105*    IV Fluid Intake:   . sodium chloride 20 mL/hr at 06/29/13 0900  . feeding supplement (JEVITY 1.2 CAL) 1,000 mL (06/30/13 0547)    MEDICATIONS  . antiseptic oral rinse  15 mL Mouth Rinse q12n4p  . carbidopa-levodopa  1.5 tablet Per NG tube Q6H  . chlorhexidine  15 mL Mouth Rinse BID  . free water  300 mL Per Tube Q6H  . pantoprazole (PROTONIX) IV  40 mg Intravenous QHS  . senna-docusate  1 tablet Oral BID   PRN:  acetaminophen, acetaminophen, hydrALAZINE, labetalol  Diet:  NPO will start TF. Activity:  Activity as tolerated DVT Prophylaxis:  SCD  CLINICALLY SIGNIFICANT STUDIES Basic Metabolic Panel:   Recent Labs Lab 06/28/13 0431 06/29/13 0455  NA 133* 133*  K 3.8 3.5  CL 98 102  CO2 19 21  GLUCOSE 110* 125*  BUN 24* 39*  CREATININE 0.83 0.83  CALCIUM 9.7 9.4   Liver Function Tests:   Recent Labs Lab 06/25/13 2129  06/27/13 0340  AST 21 29  ALT <5 16  ALKPHOS 57 47  BILITOT 0.5 1.4*  PROT 7.0 6.2  ALBUMIN 4.2 3.6   CBC:   Recent Labs Lab 06/25/13 2129  06/27/13 0340 06/29/13 0455  WBC 8.7  --  9.8 9.3  NEUTROABS 5.5  --  6.8  --   HGB 15.4  < > 14.3 13.9  HCT 44.9  < > 41.7 40.3  MCV 90.2  --  89.1 87.0  PLT 209  --  197 201  < > = values in this interval not displayed. Coagulation:   Recent Labs Lab 06/25/13 2129  LABPROT 13.8  INR 1.08   Cardiac Enzymes:   Recent Labs Lab 06/25/13 2129  TROPONINI <0.30   Urinalysis:   Recent Labs Lab 06/26/13 0101 06/28/13 0904  COLORURINE YELLOW YELLOW  LABSPEC 1.016 1.022  PHURINE 6.5 5.5  GLUCOSEU NEGATIVE NEGATIVE  HGBUR NEGATIVE SMALL*  BILIRUBINUR NEGATIVE NEGATIVE  KETONESUR NEGATIVE >80*  PROTEINUR NEGATIVE 30*  UROBILINOGEN 0.2 0.2  NITRITE NEGATIVE NEGATIVE  LEUKOCYTESUR NEGATIVE NEGATIVE   Lipid Panel No results found for this basename: chol,  trig,  hdl,  cholhdl,  vldl,  ldlcalc   HgbA1C  No results found for this basename: HGBA1C    Urine  Drug Screen:     Component Value Date/Time   LABOPIA NONE DETECTED 06/26/2013 0101   COCAINSCRNUR NONE DETECTED 06/26/2013 0101   LABBENZ NONE DETECTED 06/26/2013 0101   AMPHETMU NONE DETECTED 06/26/2013 0101   THCU NONE DETECTED 06/26/2013 0101   LABBARB NONE DETECTED 06/26/2013 0101    Alcohol Level:   Recent Labs Lab 06/25/13 2129  ETH <11    Ct Head Wo Contrast 06/30/2013    No significant change in appearance of left basal ganglia hemorrhage and associated mass effect with similar degree of midline shift to the right.   Dg Chest Port 1 View 06/28/2013   CLINICAL DATA:  Fever.  EXAM: PORTABLE CHEST - 1 VIEW  COMPARISON:  09/03/2009.  FINDINGS: Prior CABG. Cardiomegaly. Normal pulmonary vascularity. Lungs clear. No pleural effusion pneumothorax. No acute osseous abnormality.  IMPRESSION: Prior CABG. Stable cardiomegaly. No CHF. No acute cardiopulmonary  disease.   Electronically Signed   By: Maisie Fus  Register   On: 06/28/2013 11:11   Dg Abd Portable 1v 06/28/2013   CLINICAL DATA:  Evaluate feeding tube placement.  EXAM: PORTABLE ABDOMEN - 1 VIEW  COMPARISON:  Chest radiograph 06/28/2013  FINDINGS: There is feeding tube tip in the right upper abdomen. The tip is probably in the region of the duodenum bulb. Calcifications in the aorta. Degenerative changes in the lower lumbar spine. Nonspecific bowel gas pattern.  IMPRESSION: Feeding tube tip is in the region of the proximal duodenum.   Electronically Signed   By: Richarda Overlie M.D.   On: 06/28/2013 12:40    CT of the brain   06/27/2013 1. Left basal ganglia hematoma with 3 mm left-to-right midline  shift. Critical Value/emergent results were called by telephone at  the time of interpretation on 06/25/2013 at 9:45 PM to Dr.Stewart,  who verbally acknowledged these results.  2. Atrophy and chronic microvascular white matter ischemic changes.  CT head  06/27/13 No significant interval change in size of left basal ganglia intraparenchymal hemorrhage with stable 6 mm of left-to-right midline shift. No hydrocephalus. No new hemorrhage.  06/30/2013 No significant change in appearance of left basal ganglia hemorrhage and associated mass effect with similar degree of midline shift to the right. Please see above.  MRI of the brain    MRA of the brain    2D Echocardiogram    Carotid Doppler    CXR    EKG   Sinus rhythm Multiform ventricular premature complexes Prolonged PR interval Right bundle branch block  Therapy Recommendations CIR  Physical Exam  General: The patient is sleepy, but can be aroused.  Respiratory: Lung fields are clear.  Cardiovascular: There is a regular rate and rhythm, no obvious murmurs or rubs are noted.  Abdomen: Soft nontender, positive bowel sounds.  Skin: No significant peripheral edema is noted.   Neurologic Exam  Mental status: The patient is  arousable, will follow commands.  Cranial nerves: Facial symmetry is not resent, with depression of the right nasolabial fold. Gaze preference is to the left, but the patient is able to deviate the eyes to the right. The patient will blink to threat from the left, minimal blink on the right. The patient is minimally verbal, will occasionally attempt to whisper some words. Severely dysarthric and dysphonic.  Motor: The patient has a right hemiparesis, arm greater than leg. The patient moves the left side.  Sensory examination: The patient has some response to deep pain stimulation on all fours.  Coordination: There does not appear to  be dysmetria with the use of the left arm and leg, weakness with the right side. Resting tremors noted on the left arm.  Gait and station: The gait cannot be tested.  Reflexes: Deep tendon reflexes are symmetric, slightly depressed. Bilateral Babinski's noted, more prominent on the right.    ASSESSMENT Mr. Eric Oneill is a 79 y.o. male presenting with left BG ICH, hypertensive in nature . No intraventricular extension. Patient was on baby aspirin 81mg  daily prior to admission. There is a pre-existing history of Parkinson's disease and coronary artery disease.    Parkinson's disease  Accelerated hypertension, blood pressure had decreased, BP meds stopped, stable.  Left BG ICH  Dyslipidemia  Coronary artery disease  Calorie malnutrition: tube feedings started.  Hospital day # 5  CT the head shows stability . The patient is quite dysarthric, not clearly understanding language completely. The patient is trying to verbalize, will  follow commands.  BP meds discontinued after sinemet was started, as BP has dropped significantly.  TREATMENT/PLAN  Hold aspirin due to hemorrhage  CIR recommended. May go to CIR when bed available.  Transfer out of unit to 4N  Continue to work with therapy  I spoke to the Wellsite geologist. at [facility] [facility]  who felt patient did not qualify for inpatient rehabilitation and hence wouldn't pursue skilled nursing facility for rehabilitation  Gwendolyn Lima. Manson Passey, [facility], MBA, MHA [facility] Pager: [phone] 06/30/2013 9:19 AM   I have personally obtained a history, examined the patient, evaluated imaging results, and formulated the assessment and plan of care. I agree with the above.  Delia Heady, MD

## 2013-06-30 NOTE — Progress Notes (Signed)
Occupational Therapy Treatment Patient Details Name: Eric Oneill MRN: [id number] DOB: Dec 27, 1927 Today's Date: 06/30/2013 Time: 4098-1191 OT Time Calculation (min): 19 min  OT Assessment / Plan / Recommendation  History of present illness Eric Oneill is an 79 y.o. male with a history of hypertension, hyperlipidemia, coronary artery disease, and Parkinson's disease, presenting with new onset speech difficulty and right-sided weakness. Onset was 8:30 PM. There is no previous history of stroke nor TIA. Patient has been taking aspirin 81 mg per day. CT scan of his head showed left basal ganglia acute intraparenchymal hemorrhage. There was no ventricular extension. NIH stroke score was 12.     OT comments  Pt demonstrating improvement in regard of R visual field.  Emerging elbow flexion on R.  Instructed daughters in positioning R UE on pillow, performing PROM to R UE, and speaking to pt from R side.  Follow Up Recommendations  CIR    Barriers to Discharge       Equipment Recommendations       Recommendations for Other Services    Frequency Min 3X/week   Progress towards OT Goals Progress towards OT goals: Progressing toward goals  Plan Discharge plan remains appropriate    Precautions / Restrictions Precautions Precautions: Fall Precaution Comments: posterior lean. L bias Restrictions Weight Bearing Restrictions: No   Pertinent Vitals/Pain VSS, no pain    ADL  Equipment Used: Gait belt Transfers/Ambulation Related to ADLs: +2 total assist, pt 10 % ADL Comments: transferred from chair back to bed for transfer to 4N.  Improvement noted in pt's regard of R hemispace.  Encouraged daughters to speak to pt from R and touch R UE.  Educated them in PROM of R UE (shoulder to 90 degrees only)    OT Diagnosis:    OT Problem List:   OT Treatment Interventions:     OT Goals(current goals can now be found in the care plan section) Acute Rehab OT Goals Patient Stated Goal: unable  to state  Visit Information  Last OT Received On: 06/30/13 Assistance Needed: +2 History of Present Illness: Eric Oneill is an 79 y.o. male with a history of hypertension, hyperlipidemia, coronary artery disease, and Parkinson's disease, presenting with new onset speech difficulty and right-sided weakness. Onset was 8:30 PM. There is no previous history of stroke nor TIA. Patient has been taking aspirin 81 mg per day. CT scan of his head showed left basal ganglia acute intraparenchymal hemorrhage. There was no ventricular extension. NIH stroke score was 12.      Subjective Data      Prior Functioning       Cognition  Cognition Arousal/Alertness: Awake/alert Overall Cognitive Status: Impaired/Different from baseline Area of Impairment: Following commands;Problem solving Current Attention Level: Focused Following Commands: Follows one step commands with increased time Awareness: Intellectual Problem Solving: Slow processing;Difficulty sequencing;Requires verbal cues;Requires tactile cues    Mobility  Bed Mobility Bed Mobility: Sit to Supine Sit to Supine: 1: +2 Total assist Sit to Supine: Patient Percentage: 10% Details for Bed Mobility Assistance: tends to push with L UE when performing sit to L sidelying/supine Transfers Sit to Stand: 1: +2 Total assist;From chair/3-in-1 Sit to Stand: Patient Percentage: 10% Stand to Sit: 1: +2 Total assist;To bed Stand to Sit: Patient Percentage: 10% Details for Transfer Assistance: pt unable to take steps, used gait belt and bed pad to complete transfer    Exercises      Balance Balance Balance Assessed: Yes Static Sitting Balance Static  Sitting - Balance Support: Bilateral upper extremity supported;Feet supported Static Sitting - Level of Assistance: 2: Max assist Static Sitting - Comment/# of Minutes: 3   End of Session OT - End of Session Equipment Utilized During Treatment: Gait belt Activity Tolerance: Patient tolerated  treatment well Patient left: in bed;with call bell/phone within reach;with family/visitor present Nurse Communication: Mobility status  GO     Evern Bio 06/30/2013, 2:40 PM [phone]

## 2013-06-30 NOTE — Consult Note (Signed)
Physical Medicine and Rehabilitation Consult Reason for Consult: Left basal ganglia ICH Referring Physician: Dr. Pearlean Brownie   HPI: Eric Oneill is a 79 y.o. right handed male with history of hypertension, coronary artery disease with CABG as well as Parkinson's disease. Admitted 06/25/2013 with right-sided weakness and speech difficulty. Cranial CT scan showed a left basal ganglia hematoma with 3 mm left-to-right midline shift. Blood pressure 186/94 on admission. Patient did not receive TPA. Followup cranial CT scan 06/30/2013 without significant change. Patient is presently n.p.o. with nasogastric tube feeds. Sinemet has been resumed for history of Parkinson's disease. Physical and occupational therapy evaluations completed with recommendations of physical medicine rehabilitation consult to consider inpatient rehabilitation services.   Review of Systems  Unable to perform ROS: language   Past Medical History  Diagnosis Date  . Coronary artery disease   . Parkinson disease     dx: 06/2007  . Depression    Past Surgical History  Procedure Laterality Date  . Coronary artery bypass graft    . Hernia repair      x2  . Circumcision  Aug 2002  . Carpal tunnel release      L  . Knee surgery      bilateral   Family History  Problem Relation Age of Onset  . Cancer Sister     breast  . Cancer Brother     myeloma  . Stroke Father    Social History:  reports that he quit smoking about 55 years ago. He has never used smokeless tobacco. He reports that he drinks alcohol. He reports that he does not use illicit drugs. Allergies:  Allergies  Allergen Reactions  . Ivp Dye [Iodinated Diagnostic Agents] Other (See Comments)    Dizziness    Medications Prior to Admission  Medication Sig Dispense Refill  . aspirin EC 81 MG tablet Take 81 mg by mouth daily.      . carbidopa-levodopa (SINEMET CR) 50-200 MG per tablet Take 1 tablet by mouth at bedtime.  90 tablet  3  . carbidopa-levodopa  (SINEMET IR) 25-100 MG per tablet Take 1.5 tablets by mouth 4 (four) times daily.  540 tablet  1  . Cholecalciferol 1000 UNITS TBDP Take 1,000 Units by mouth daily.      Marland Kitchen escitalopram (LEXAPRO) 10 MG tablet Take 1 tablet (10 mg total) by mouth daily.  90 tablet  3  . ibuprofen (ADVIL,MOTRIN) 200 MG tablet Take 200 mg by mouth every 6 (six) hours as needed for pain.      Marland Kitchen lisinopril (PRINIVIL,ZESTRIL) 20 MG tablet Take 10 mg by mouth daily.      . selegiline (ELDEPRYL) 5 MG tablet Take 5 mg by mouth daily with breakfast.      . simvastatin (ZOCOR) 40 MG tablet Take 40 mg by mouth at bedtime.          Home: Home Living Family/patient expects to be discharged to:: Inpatient rehab Available Help at Discharge: Family Type of Home: House Additional Comments: Family states they can provide 24/7 assistance  Lives With: Alone;Other (Comment) (beside family's home)  Functional History:   Functional Status:  Mobility: Bed Mobility Bed Mobility: Left Sidelying to Sit;Sitting - Scoot to Edge of Bed;Sit to Supine Left Sidelying to Sit: 2: Max assist;HOB elevated Sitting - Scoot to [place] of Bed: 2: Max assist Sit to Supine: 1: +2 Total assist Sit to Supine: Patient Percentage: 10% Transfers Transfers: Sit to Stand;Stand to Sit Sit to Stand: 1: +2 Total  assist;From bed Sit to Stand: Patient Percentage: 30% Stand to Sit: To bed;1: +2 Total assist Stand to Sit: Patient Percentage: 30% Ambulation/Gait Ambulation/Gait Assistance: Not tested (comment)    ADL: ADL Eating/Feeding: NPO Grooming: Maximal assistance Where Assessed - Grooming: Supported sitting Upper Body Bathing: +1 Total assistance Where Assessed - Upper Body Bathing: Supported sitting Lower Body Bathing: +1 Total assistance Where Assessed - Lower Body Bathing: Supported sit to stand Upper Body Dressing: +1 Total assistance Where Assessed - Upper Body Dressing: Supported sitting Lower Body Dressing: +1 Total assistance Where  Assessed - Lower Body Dressing: Supported sit to Pharmacist, hospital: +2 Total assistance Equipment Used: Gait belt Transfers/Ambulation Related to ADLs: +2 . R bias ADL Comments: Pushing to R and posteirorly at times. Washed face with washcloth on command. Asking for water.  Cognition: Cognition Overall Cognitive Status: Impaired/Different from baseline Arousal/Alertness: Lethargic Orientation Level: Oriented to person;Oriented to place;Oriented to time;Oriented to situation Attention: Focused Focused Attention: Impaired Focused Attention Impairment: Verbal basic;Functional basic Memory:  (UTA) Problem Solving: Impaired Problem Solving Impairment: Functional basic Executive Function: Initiating Initiating: Impaired Cognition Arousal/Alertness: Lethargic Behavior During Therapy: Flat affect Overall Cognitive Status: Impaired/Different from baseline Area of Impairment: Orientation;Attention;Following commands;Awareness;Problem solving Current Attention Level: Focused Following Commands: Follows one step commands with increased time Awareness: Intellectual Problem Solving: Slow processing;Decreased initiation;Requires verbal cues;Requires tactile cues  Blood pressure 121/40, pulse 56, temperature 97.3 F (36.3 C), temperature source Oral, resp. rate 17, height 5\' 9"  (1.753 m), weight 89 kg (196 lb 3.4 oz), SpO2 95.00%. Physical Exam  Vitals reviewed. HENT:  Head: Normocephalic.  Eyes:  Pupils round and reactive to light  Neck: Normal range of motion. Neck supple. No thyromegaly present.  Cardiovascular: Normal rate and regular rhythm.   Respiratory: Effort normal and breath sounds normal. No respiratory distress.  GI: Soft. Bowel sounds are normal. He exhibits no distension.  Neurological:  Patient is alert and makes eye contact. He has a left gaze preference. Right facial droop seen. He is able to to protrude his tongue and smile. He speaks a very low volume which is  unintelligible. RUE and RLE are 0/5 but do withdraw reflexively to pain. DTR's 3+. LUE and LLE are grossly 3/5  Skin: Skin is warm and dry.  Psychiatric:  Slow to process. Limited awareness and insight    Results for orders placed during the hospital encounter of 06/25/13 (from the past 24 hour(s))  GLUCOSE, CAPILLARY     Status: Abnormal   Collection Time    06/29/13 12:17 PM      Result Value Range   Glucose-Capillary 106 (*) 70 - 99 mg/dL  GLUCOSE, CAPILLARY     Status: Abnormal   Collection Time    06/29/13  4:03 PM      Result Value Range   Glucose-Capillary 123 (*) 70 - 99 mg/dL  GLUCOSE, CAPILLARY     Status: Abnormal   Collection Time    06/29/13  8:01 PM      Result Value Range   Glucose-Capillary 112 (*) 70 - 99 mg/dL  GLUCOSE, CAPILLARY     Status: Abnormal   Collection Time    06/29/13 11:36 PM      Result Value Range   Glucose-Capillary 109 (*) 70 - 99 mg/dL  GLUCOSE, CAPILLARY     Status: Abnormal   Collection Time    06/30/13  3:51 AM      Result Value Range   Glucose-Capillary 105 (*) 70 - 99 mg/dL  Ct Head Wo Contrast  06/30/2013   CLINICAL DATA:  Follow-up intracranial hemorrhage. No change in mental status.  EXAM: CT HEAD WITHOUT CONTRAST  TECHNIQUE: Contiguous axial images were obtained from the base of the skull through the vertex without intravenous contrast.  COMPARISON:  06/28/2013.  FINDINGS: Left basal ganglia 4.1 x 2.4 cm hematoma with associated vasogenic edema and mass effect upon the left lateral ventricle with midline shift to the right by 5.6 mm. Appearance without significant change.  Hemorrhage may be related to hypertension or amyloid angiopathy. Recommend follow-up until clearance.  No new intracranial hemorrhage or CT evidence of large acute thrombotic infarct. No intracranial mass lesion seen separate from above described findings.  Vascular calcifications.  IMPRESSION: No significant change in appearance of left basal ganglia hemorrhage and  associated mass effect with similar degree of midline shift to the right. Please see above.   Electronically Signed   By: Bridgett Larsson M.D.   On: 06/30/2013 07:20   Dg Chest Port 1 View  06/28/2013   CLINICAL DATA:  Fever.  EXAM: PORTABLE CHEST - 1 VIEW  COMPARISON:  09/03/2009.  FINDINGS: Prior CABG. Cardiomegaly. Normal pulmonary vascularity. Lungs clear. No pleural effusion pneumothorax. No acute osseous abnormality.  IMPRESSION: Prior CABG. Stable cardiomegaly. No CHF. No acute cardiopulmonary disease.   Electronically Signed   By: Maisie Fus  Register   On: 06/28/2013 11:11   Dg Abd Portable 1v  06/28/2013   CLINICAL DATA:  Evaluate feeding tube placement.  EXAM: PORTABLE ABDOMEN - 1 VIEW  COMPARISON:  Chest radiograph 06/28/2013  FINDINGS: There is feeding tube tip in the right upper abdomen. The tip is probably in the region of the duodenum bulb. Calcifications in the aorta. Degenerative changes in the lower lumbar spine. Nonspecific bowel gas pattern.  IMPRESSION: Feeding tube tip is in the region of the proximal duodenum.   Electronically Signed   By: Richarda Overlie M.D.   On: 06/28/2013 12:40    Assessment/Plan: Diagnosis: large left basal ganglia hemorrhage 1. Does the need for close, 24 hr/day medical supervision in concert with the patient's rehab needs make it unreasonable for this patient to be served in a less intensive setting? Yes 2. Co-Morbidities requiring supervision/potential complications: htn, PD 3. Due to bladder management, bowel management, safety, skin/wound care, disease management, medication administration, pain management and patient education, does the patient require 24 hr/day rehab nursing? Yes 4. Does the patient require coordinated care of a physician, rehab nurse, PT (1-2 hrs/day, 5 days/week), OT (1-2 hrs/day, 5 days/week) and SLP (1-2 hrs/day, 5 days/week) to address physical and functional deficits in the context of the above medical diagnosis(es)? Yes Addressing  deficits in the following areas: balance, endurance, locomotion, strength, transferring, bowel/bladder control, bathing, dressing, feeding, grooming, toileting, cognition, speech, language, swallowing and psychosocial support 5. Can the patient actively participate in an intensive therapy program of at least 3 hrs of therapy per day at least 5 days per week? Yes 6. The potential for patient to make measurable gains while on inpatient rehab is good 7. Anticipated functional outcomes upon discharge from inpatient rehab are min to mod assist with PT, mod assist with OT, min to mod assist with SLP. 8. Estimated rehab length of stay to reach the above functional goals is: 22-30 days 9. Does the patient have adequate social supports to accommodate these discharge functional goals? Yes 10. Anticipated D/C setting: Home 11. Anticipated post D/C treatments: HH therapy 12. Overall Rehab/Functional Prognosis: good  RECOMMENDATIONS: This  patient's condition is appropriate for continued rehabilitative care in the following setting: CIR Patient has agreed to participate in recommended program. Potentially Note that insurance prior authorization may be required for reimbursement for recommended care.  Comment: Daughter was present who will bring him home with her (lives on same property).  Ranelle Oyster, MD, [facility] Physical Medicine & Rehabilitation     06/30/2013

## 2013-06-30 NOTE — Progress Notes (Signed)
Physical Therapy Treatment Patient Details Name: Eric Oneill MRN: [id number] DOB: Jun 09, 1928 Today's Date: 06/30/2013 Time: 4098-1191 PT Time Calculation (min): 24 min  PT Assessment / Plan / Recommendation  History of Present Illness Eric Oneill is an 79 y.o. male with a history of hypertension, hyperlipidemia, coronary artery disease, and Parkinson's disease, presenting with new onset speech difficulty and right-sided weakness. Onset was 8:30 PM. There is no previous history of stroke nor TIA. Patient has been taking aspirin 81 mg per day. CT scan of his head showed left basal ganglia acute intraparenchymal hemorrhage. There was no ventricular extension. NIH stroke score was 12.     PT Comments   Pt with improved command follow this date however remains to require max/total assist for all mobility. Pt was indep PTA and con't to be an excellent candidate for CIR upon d/c from hospital.   Follow Up Recommendations  CIR     Does the patient have the potential to tolerate intense rehabilitation     Barriers to Discharge        Equipment Recommendations  None recommended by PT    Recommendations for Other Services Rehab consult  Frequency Min 4X/week   Progress towards PT Goals Progress towards PT goals: Progressing toward goals  Plan Current plan remains appropriate    Precautions / Restrictions Precautions Precautions: Fall Restrictions Weight Bearing Restrictions: No   Pertinent Vitals/Pain Denies pain    Mobility  Bed Mobility Bed Mobility: Supine to Sit;Sitting - Scoot to Edge of Bed Supine to Sit: 1: +2 Total assist;HOB elevated Supine to Sit: Patient Percentage: 20% Sitting - Scoot to Edge of Bed: 2: Max assist Details for Bed Mobility Assistance: pt able to move L LE off EOB, pt initiated using L UE however requiring maxA for trunk elevation and to bring hips to EOB Transfers Transfers: Sit to Stand;Stand to Sit;Stand Pivot Transfers Sit to Stand: 1: +2  Total assist;From bed Sit to Stand: Patient Percentage: 30% Stand to Sit: 1: +2 Total assist;To chair/3-in-1 Stand to Sit: Patient Percentage: 30% Stand Pivot Transfers: 1: +2 Total assist Stand Pivot Transfers: Patient Percentage: 10% Details for Transfer Assistance: pt unable to take steps, used gait belt and bed pad to complete transfer Ambulation/Gait Ambulation/Gait Assistance: Not tested (comment) Modified Rankin (Stroke Patients Only) Pre-Morbid Rankin Score: No symptoms Modified Rankin: Severe disability    Exercises     PT Diagnosis:    PT Problem List:   PT Treatment Interventions:     PT Goals (current goals can now be found in the care plan section) Acute Rehab PT Goals Patient Stated Goal: unable to state  Visit Information  Last PT Received On: 06/30/13 Assistance Needed: +2 History of Present Illness: Eric Oneill is an 79 y.o. male with a history of hypertension, hyperlipidemia, coronary artery disease, and Parkinson's disease, presenting with new onset speech difficulty and right-sided weakness. Onset was 8:30 PM. There is no previous history of stroke nor TIA. Patient has been taking aspirin 81 mg per day. CT scan of his head showed left basal ganglia acute intraparenchymal hemorrhage. There was no ventricular extension. NIH stroke score was 12.      Subjective Data  Patient Stated Goal: unable to state   Cognition  Cognition Arousal/Alertness: Lethargic Behavior During Therapy: Flat affect Overall Cognitive Status: Impaired/Different from baseline Area of Impairment: Following commands;Problem solving Current Attention Level: Focused Following Commands: Follows one step commands with increased time Awareness: Intellectual Problem Solving: Slow processing;Difficulty sequencing;Requires  verbal cues;Requires tactile cues    Balance  Balance Balance Assessed: Yes Static Sitting Balance Static Sitting - Balance Support: Bilateral upper extremity  supported;Feet supported Static Sitting - Level of Assistance: 2: Max assist Static Sitting - Comment/# of Minutes: 10  End of Session PT - End of Session Equipment Utilized During Treatment: Gait belt Activity Tolerance: Patient tolerated treatment well Patient left: in bed;with call bell/phone within reach;with nursing/sitter in room Nurse Communication: Mobility status   GP     Marcene Brawn 06/30/2013, 10:48 AM  Lewis Shock, PT, DPT Pager #: [phone] Office #: [phone]

## 2013-06-30 NOTE — Progress Notes (Signed)
MD on call and patient's daughter Andrey Campanile notified that the patient pulled NG tube. Patient resting quietly at this time.

## 2013-06-30 NOTE — Progress Notes (Signed)
Speech Language Pathology Treatment: Dysphagia;Cognitive-Linquistic  Patient Details Name: Eric Oneill MRN: [id number] DOB: 1928/07/08 Today's Date: 06/30/2013 Time: 1000-1047 SLP Time Calculation (min): 47 min  Assessment / Plan / Recommendation Clinical Impression  Treatment focused on communication and dysphagia. Daughter present and supportive. Patient able to follow basic 1-step commands with moderate cues today, often requiring just one repetition prior to carryout. Note a delay in processing time of up to 20 seconds. Patient able to verbalize rhythmic automatic speech with min clinician cueing for increased over articulation and volume. Per daughter, volume decreased at baseline due to Parkinson's, now worse. Oral care provided. Ice chip and pureed solid trials provided for differential diagnosis of dysphagia. Ability to transit bolus posteriorly improved from prior treatments however remained moderately-severely delayed. Patient able to initiate a swallow with minimal verbal and tactile cues. Wet vocal quality and throat clearing with ice chips indicative of decreased airway protection decreased with trials of pureed bolus. Overall, patient making good gains. With continued improvement at bedside, patient will be ready to proceed with MBS to objectively evaluate function, hopefully in the next 24-48 hours. Education complete with daughter regarding above, importance of oral care, and benefits of ice chips after oral care to facilitate use of swallowing musculature. Daughter verbalized understanding. Will continue to f/u.    HPI HPI: 79 yo male adm to [facility] with speech deficits and right sided weakness.  Pt found to have a left BG hemorrhage.  PMH + for Parkinson's disease, CAD, depression, dysphagia.  Pt for BSE as she did not pass a RN stroke swallow screen.       SLP Plan  Continue with current plan of care    Recommendations Diet recommendations: NPO (except ice chips after oral  care) Medication Administration: Via alternative means             General recommendations: Rehab consult Oral Care Recommendations: Oral care Q4 per protocol;Oral care prior to ice chips Follow up Recommendations: Inpatient Rehab Plan: Continue with current plan of care    GO   [facility] MA, CCC-SLP [phone]   Eric Oneill 06/30/2013, 11:56 AM

## 2013-06-30 NOTE — Progress Notes (Signed)
Patient transferred to air overlay mattress.

## 2013-07-01 ENCOUNTER — Inpatient Hospital Stay (HOSPITAL_COMMUNITY): Payer: Medicare Other

## 2013-07-01 DIAGNOSIS — R1310 Dysphagia, unspecified: Secondary | ICD-10-CM

## 2013-07-01 LAB — GLUCOSE, CAPILLARY: Glucose-Capillary: 116 mg/dL — ABNORMAL HIGH (ref 70–99)

## 2013-07-01 MED ORDER — INSULIN ASPART 100 UNIT/ML ~~LOC~~ SOLN
0.0000 [IU] | SUBCUTANEOUS | Status: DC
  Administered 2013-07-03 – 2013-07-04 (×3): 1 [IU] via SUBCUTANEOUS

## 2013-07-01 NOTE — Progress Notes (Signed)
Seen and agreed 07/01/2013 Rashied Corallo Elizabeth PTA [phone] pager [phone] office    

## 2013-07-01 NOTE — Progress Notes (Signed)
UR complete.  Jaculin Rasmus RN, MSN 

## 2013-07-01 NOTE — Procedures (Signed)
Objective Swallowing Evaluation: Modified Barium Swallowing Study  Patient Details  Name: Eric Oneill MRN: [id number] Date of Birth: [birth year]  Today's Date: 07/01/2013 Time: 1150-1220 SLP Time Calculation (min): 30 min  Past Medical History:  Past Medical History  Diagnosis Date  . Coronary artery disease   . Parkinson disease     dx: 06/2007  . Depression    Past Surgical History:  Past Surgical History  Procedure Laterality Date  . Coronary artery bypass graft    . Hernia repair      x2  . Circumcision  Aug 2002  . Carpal tunnel release      L  . Knee surgery      bilateral   HPI:  79 yo male adm to [facility] with speech deficits and right sided weakness.  Pt found to have a left BG hemorrhage.  PMH + for Parkinson's disease, CAD, depression, dysphagia.  Pt for BSE as she did not pass a RN stroke swallow screen.      Assessment / Plan / Recommendation Clinical Impression  Dysphagia Diagnosis: Moderate oral phase dysphagia;Severe pharyngeal phase dysphagia Clinical impression: Pt. exhibits a moderate to severe sensori-oromotor dysphagia,  characterized by oral weakness with difficulty propelling bolus orally, delayed swallow initiation, decreased hyoid excursion and laryngeal elevation, decreased tongue base contraction to the pharyngeal wall.  These deficits result in trace penetration to the cords with honey thick liquids during the swallow, moderate aspiration of nectar thick liquds after the swallow from residue in the pyriforms, and laryngeal stasis with purees, most of which clears with chin tuck and double swallows.  Pt. was able to follow verbal commands for compensatory strategies.  Pt. will be able to begin po trials with SLP only (purees), and will need to proceed with PEG for main source of nutrition at this time.  Prognosis for return to po diet is good, with time and swallow retrainning.    Treatment Recommendation  Therapy as outlined in treatment plan below     Diet Recommendation NPO;Alternative means - long-term   Medication Administration: Via alternative means Compensations: Slow rate;Small sips/bites;Multiple dry swallows after each bite/sip Postural Changes and/or Swallow Maneuvers: Seated upright 90 degrees;Chin tuck    Other  Recommendations Oral Care Recommendations: Oral care Q4 per protocol;Oral care prior to ice chips   Follow Up Recommendations       Frequency and Duration min 3x week  2 weeks   Pertinent Vitals/Pain N/a    SLP Swallow Goals  See care plan   General Date of Onset: 06/26/13 HPI: 79 yo male adm to [facility] with speech deficits and right sided weakness.  Pt found to have a left BG hemorrhage.  PMH + for Parkinson's disease, CAD, depression, dysphagia.  Pt for BSE as she did not pass a RN stroke swallow screen.  Type of Study: Modified Barium Swallowing Study Reason for Referral: Objectively evaluate swallowing function Previous Swallow Assessment: MBS:  12/2012 OUTPT, mild oropharyngeal dysphagia without aspiration of any consistency tested.  Rec soft/ground meats/thin, stasis, penetration of thin x1 of 10   BSE 11/27, NPO Diet Prior to this Study: NPO;Panda Temperature Spikes Noted: No Respiratory Status: Nasal cannula Behavior/Cognition: Alert;Cooperative;Requires cueing Oral Cavity - Dentition: Adequate natural dentition Self-Feeding Abilities: Total assist Patient Positioning: Upright in chair Baseline Vocal Quality: Aphonic Volitional Cough: Cognitively unable to elicit Anatomy: Within functional limits Pharyngeal Secretions: Not observed secondary MBS    Reason for Referral Objectively evaluate swallowing function   Oral Phase Oral  Preparation/Oral Phase Oral Phase: Impaired Oral - Solids Oral - Puree: Right anterior bolus loss;Weak lingual manipulation;Incomplete tongue to palate contact;Reduced posterior propulsion;Delayed oral transit   Pharyngeal Phase Pharyngeal Phase Pharyngeal Phase:  Impaired Pharyngeal - Honey Pharyngeal - Honey Teaspoon: Delayed swallow initiation;Premature spillage to valleculae;Premature spillage to pyriform sinuses;Reduced pharyngeal peristalsis;Reduced anterior laryngeal mobility;Reduced laryngeal elevation;Reduced tongue base retraction;Penetration/Aspiration during swallow;Compensatory strategies attempted (Comment) (Chin tuck and double swallows) Penetration/Aspiration details (honey teaspoon): Material enters airway, CONTACTS cords and not ejected out Pharyngeal - Nectar Pharyngeal - Nectar Teaspoon: Delayed swallow initiation;Premature spillage to pyriform sinuses;Reduced anterior laryngeal mobility;Reduced laryngeal elevation;Reduced tongue base retraction;Penetration/Aspiration after swallow;Moderate aspiration;Pharyngeal residue - valleculae;Pharyngeal residue - pyriform sinuses;Compensatory strategies attempted (Comment) (chin tuck, double swallows) Penetration/Aspiration details (nectar teaspoon): Material enters airway, passes BELOW cords and not ejected out despite cough attempt by patient Pharyngeal - Solids Pharyngeal - Puree: Delayed swallow initiation;Premature spillage to valleculae;Reduced anterior laryngeal mobility;Reduced laryngeal elevation;Reduced tongue base retraction;Pharyngeal residue - valleculae;Compensatory strategies attempted (Comment) (Chin tuck and double swallows)  Cervical Esophageal Phase    GO    Cervical Esophageal Phase Cervical Esophageal Phase: Vicente Masson T 07/01/2013, 1:20 PM

## 2013-07-01 NOTE — Progress Notes (Addendum)
Stroke Team Progress Note  Eric Oneill is an 79 y.o. male with a Eric of hypertension, hyperlipidemia, coronary artery disease, and Parkinson's disease, presenting with new onset speech difficulty and right-sided weakness. Onset was 8:30 PM. There is no previous Eric of stroke nor TIA. Patient has been taking aspirin 81 mg per day. CT scan of his head showed left basal ganglia acute intraparenchymal hemorrhage. There was no ventricular extension. NIH stroke score was 12.   LSN: 8:30 PM on 06/25/2013  tPA Given: No: ICH  MRankin: 3    Patient was not a TPA candidate secondary to ICH.   SUBJECTIVE No talking this am. No neurological improvement. Still with dense right hemiplegia.   OBJECTIVE Most recent Vital Signs: Filed Vitals:   06/30/13 1839 06/30/13 2110 07/01/13 0115 07/01/13 0540  BP: 185/68 152/67 154/69 144/57  Pulse: 75 75 67 62  Temp: 98.1 F (36.7 C) 98.6 F (37 C) 98.8 F (37.1 C) 98.3 F (36.8 C)  TempSrc: Oral Oral Oral Oral  Resp: 18 20 22 20   Height:      Weight:    93.7 kg (206 lb 9.1 oz)  SpO2: 99% 98% 94% 96%   CBG (last 3)   Recent Labs  06/30/13 0804 06/30/13 2242 07/01/13 0651  GLUCAP 114* 100* 116*    IV Fluid Intake:   . sodium chloride 20 mL/hr at 06/30/13 2253  . feeding supplement (JEVITY 1.2 CAL) 1,000 mL (07/01/13 0501)    MEDICATIONS  . antiseptic oral rinse  15 mL Mouth Rinse q12n4p  . carbidopa-levodopa  1.5 tablet Per NG tube Q6H  . chlorhexidine  15 mL Mouth Rinse BID  . free water  300 mL Per Tube Q6H  . pantoprazole (PROTONIX) IV  40 mg Intravenous QHS  . senna-docusate  1 tablet Oral BID   PRN:  acetaminophen, acetaminophen, hydrALAZINE, labetalol  Diet:  NPO panda tube feedings. Activity:  Activity as tolerated DVT Prophylaxis:  SCD  CLINICALLY SIGNIFICANT STUDIES Basic Metabolic Panel:   Recent Labs Lab 06/28/13 0431 06/29/13 0455  NA 133* 133*  K 3.8 3.5  CL 98 102  CO2 19 21  GLUCOSE 110*  125*  BUN 24* 39*  CREATININE 0.83 0.83  CALCIUM 9.7 9.4   Liver Function Tests:   Recent Labs Lab 06/25/13 2129 06/27/13 0340  AST 21 29  ALT <5 16  ALKPHOS 57 47  BILITOT 0.5 1.4*  PROT 7.0 6.2  ALBUMIN 4.2 3.6   CBC:   Recent Labs Lab 06/25/13 2129  06/27/13 0340 06/29/13 0455  WBC 8.7  --  9.8 9.3  NEUTROABS 5.5  --  6.8  --   HGB 15.4  < > 14.3 13.9  HCT 44.9  < > 41.7 40.3  MCV 90.2  --  89.1 87.0  PLT 209  --  197 201  < > = values in this interval not displayed. Coagulation:   Recent Labs Lab 06/25/13 2129  LABPROT 13.8  INR 1.08   Cardiac Enzymes:   Recent Labs Lab 06/25/13 2129  TROPONINI <0.30   Urinalysis:   Recent Labs Lab 06/26/13 0101 06/28/13 0904  COLORURINE YELLOW YELLOW  LABSPEC 1.016 1.022  PHURINE 6.5 5.5  GLUCOSEU NEGATIVE NEGATIVE  HGBUR NEGATIVE SMALL*  BILIRUBINUR NEGATIVE NEGATIVE  KETONESUR NEGATIVE >80*  PROTEINUR NEGATIVE 30*  UROBILINOGEN 0.2 0.2  NITRITE NEGATIVE NEGATIVE  LEUKOCYTESUR NEGATIVE NEGATIVE   Lipid Panel No results found for this basename: chol,  trig,  hdl,  cholhdl,  vldl,  ldlcalc   HgbA1C  No results found for this basename: HGBA1C    Urine Drug Screen:     Component Value Date/Time   LABOPIA NONE DETECTED 06/26/2013 0101   COCAINSCRNUR NONE DETECTED 06/26/2013 0101   LABBENZ NONE DETECTED 06/26/2013 0101   AMPHETMU NONE DETECTED 06/26/2013 0101   THCU NONE DETECTED 06/26/2013 0101   LABBARB NONE DETECTED 06/26/2013 0101    Alcohol Level:   Recent Labs Lab 06/25/13 2129  ETH <11    Ct Head Wo Contrast 06/30/2013    No significant change in appearance of left basal ganglia hemorrhage and associated mass effect with similar degree of midline shift to the right.   Dg Chest Port 1 View 06/28/2013   CLINICAL DATA:  Fever.  EXAM: PORTABLE CHEST - 1 VIEW  COMPARISON:  09/03/2009.  FINDINGS: Prior CABG. Cardiomegaly. Normal pulmonary vascularity. Lungs clear. No pleural effusion  pneumothorax. No acute osseous abnormality.  IMPRESSION: Prior CABG. Stable cardiomegaly. No CHF. No acute cardiopulmonary disease.   Electronically Signed   By: Maisie Fus  Register   On: 06/28/2013 11:11   Dg Abd Portable 1v 06/28/2013   CLINICAL DATA:  Evaluate feeding tube placement.  EXAM: PORTABLE ABDOMEN - 1 VIEW  COMPARISON:  Chest radiograph 06/28/2013  FINDINGS: There is feeding tube tip in the right upper abdomen. The tip is probably in the region of the duodenum bulb. Calcifications in the aorta. Degenerative changes in the lower lumbar spine. Nonspecific bowel gas pattern.  IMPRESSION: Feeding tube tip is in the region of the proximal duodenum.   Electronically Signed   By: Richarda Overlie M.D.   On: 06/28/2013 12:40    CT of the brain   06/27/2013 1. Left basal ganglia hematoma with 3 mm left-to-right midline  shift. Critical Value/emergent results were called by telephone at  the time of interpretation on 06/25/2013 at 9:45 PM to Dr.Stewart,  who verbally acknowledged these results.  2. Atrophy and chronic microvascular white matter ischemic changes.  CT head  06/27/13 No significant interval change in size of left basal ganglia intraparenchymal hemorrhage with stable 6 mm of left-to-right midline shift. No hydrocephalus. No new hemorrhage.  06/30/2013 No significant change in appearance of left basal ganglia hemorrhage and associated mass effect with similar degree of midline shift to the right. Please see above.  MRI of the brain    MRA of the brain    2D Echocardiogram    Carotid Doppler    CXR    EKG   Sinus rhythm Multiform ventricular premature complexes Prolonged PR interval Right bundle branch block  Therapy Recommendations CIR  Physical Exam  General: The patient is sleepy, but can be aroused.  Respiratory: Lung fields are clear.  Cardiovascular: There is a regular rate and rhythm, no obvious murmurs or rubs are noted.  Abdomen: Soft nontender, positive bowel  sounds.  Skin: No significant peripheral edema is noted.   Neurologic Exam  Mental status: The patient is arousable, will follow commands.  Cranial nerves: Facial symmetry is not resent, with depression of the right nasolabial fold. Gaze preference is to the left, but the patient is able to deviate the eyes to the right. The patient will blink to threat from the left, minimal blink on the right. The patient is minimally verbal, will occasionally attempt to whisper some words. Severely dysarthric and dysphonic.  Motor: The patient has a dense right hemiparesis, arm greater than leg. The patient moves the left side.  Sensory examination: The patient has some response to deep pain stimulation on all fours.  Coordination: There does not appear to be dysmetria with the use of the left arm and leg, weakness with the right side. Resting tremors noted on the left arm.  Gait and station: The gait cannot be tested.  Reflexes: Deep tendon reflexes are symmetric, slightly depressed. Bilateral Babinski's noted, more prominent on the right.    ASSESSMENT Eric Oneill is a 78 y.o. male presenting with left BG ICH, hypertensive in nature . No intraventricular extension. Patient was on baby aspirin 81mg  daily prior to admission. There is a pre-existing Eric of Parkinson's disease and coronary artery disease. The patient has a right hemiparesis, left gaze preference. Some worsening of mental status overnight. The patient is not a TPA candidate secondary to hemorrhage.   Parkinson's disease  Accelerated hypertension, blood pressure had decreased, BP meds stopped, stable.  Left BG ICH  Dyslipidemia  Coronary artery disease  Calorie malnutrition: tube feedings started.  Vasogenic/cytotoxic, mild edema  Hospital day # 6  CT the head shows stability . The patient is quite dysarthric, not clearly understanding language completely. The patient is trying to verbalize, will  follow  commands.  BP meds discontinued after sinemet was started, as BP has dropped significantly.  TREATMENT/PLAN  Hold aspirin due to hemorrhage  CIR recommended. May go to CIR when bed available.  Continue to work with therapy  Remains NPO--->panda. If swallowing does not improve, plan to place PEG. Dr. Pearlean Brownie discussed with daughter.  Patient had MBS this am, failed. Will need PEG. Daughter knows Dr. Dwain Sarna and asked if he would place PEG; however, he does not place PEG tubes any longer. I have discussed with family. Trauma MDs consulted to do PEG tenatively in am.  Gwendolyn Lima. Manson Passey, [facility], MBA, MHA [facility] Pager: [phone] 07/01/2013 8:24 AM  I have personally obtained a Eric, examined the patient, evaluated imaging results, and formulated the assessment and plan of care. I agree with the above.  Delia Heady, MD

## 2013-07-01 NOTE — Progress Notes (Signed)
Replaced a 16 fr. NG tube to left nare. Patient tolerated well.

## 2013-07-01 NOTE — Consult Note (Signed)
Reason for Consult:PEG placement Referring Physician: Brecken Oneill is an 79 y.o. male.  HPI: Eric Oneill is s/p hemorrhagic stroke that occurred on 11/26. He has been unable to pass a swallow evaluation and we have been asked to place a more permanent feeding tube. The patient appears to be nonverbal and there was no family in the room.  Past Medical History  Diagnosis Date  . Coronary artery disease   . Parkinson disease     dx: 06/2007  . Depression     Past Surgical History  Procedure Laterality Date  . Coronary artery bypass graft    . Hernia repair      x2  . Circumcision  Aug 2002  . Carpal tunnel release      L  . Knee surgery      bilateral    Family History  Problem Relation Age of Onset  . Cancer Sister     breast  . Cancer Brother     myeloma  . Stroke Father     Social History:  reports that he quit smoking about 55 years ago. He has never used smokeless tobacco. He reports that he drinks alcohol. He reports that he does not use illicit drugs.  Allergies:  Allergies  Allergen Reactions  . Ivp Dye [Iodinated Diagnostic Agents] Other (See Comments)    Dizziness     Medications: I have reviewed the patient's current medications.  Results for orders placed during the hospital encounter of 06/25/13 (from the past 48 hour(s))  GLUCOSE, CAPILLARY     Status: Abnormal   Collection Time    06/29/13  4:03 PM      Result Value Range   Glucose-Capillary 123 (*) 70 - 99 mg/dL  GLUCOSE, CAPILLARY     Status: Abnormal   Collection Time    06/29/13  8:01 PM      Result Value Range   Glucose-Capillary 112 (*) 70 - 99 mg/dL  GLUCOSE, CAPILLARY     Status: Abnormal   Collection Time    06/29/13 11:36 PM      Result Value Range   Glucose-Capillary 109 (*) 70 - 99 mg/dL  GLUCOSE, CAPILLARY     Status: Abnormal   Collection Time    06/30/13  3:51 AM      Result Value Range   Glucose-Capillary 105 (*) 70 - 99 mg/dL  GLUCOSE, CAPILLARY     Status:  Abnormal   Collection Time    06/30/13  8:04 AM      Result Value Range   Glucose-Capillary 114 (*) 70 - 99 mg/dL  GLUCOSE, CAPILLARY     Status: Abnormal   Collection Time    06/30/13 10:42 PM      Result Value Range   Glucose-Capillary 100 (*) 70 - 99 mg/dL  GLUCOSE, CAPILLARY     Status: Abnormal   Collection Time    07/01/13  6:51 AM      Result Value Range   Glucose-Capillary 116 (*) 70 - 99 mg/dL    Ct Head Wo Contrast  06/30/2013   CLINICAL DATA:  Follow-up intracranial hemorrhage. No change in mental status.  EXAM: CT HEAD WITHOUT CONTRAST  TECHNIQUE: Contiguous axial images were obtained from the base of the skull through the vertex without intravenous contrast.  COMPARISON:  06/28/2013.  FINDINGS: Left basal ganglia 4.1 x 2.4 cm hematoma with associated vasogenic edema and mass effect upon the left lateral ventricle with midline shift to the right  by 5.6 mm. Appearance without significant change.  Hemorrhage may be related to hypertension or amyloid angiopathy. Recommend follow-up until clearance.  No new intracranial hemorrhage or CT evidence of large acute thrombotic infarct. No intracranial mass lesion seen separate from above described findings.  Vascular calcifications.  IMPRESSION: No significant change in appearance of left basal ganglia hemorrhage and associated mass effect with similar degree of midline shift to the right. Please see above.   Electronically Signed   By: Eric Oneill M.D.   On: 06/30/2013 07:20   Dg Swallowing Func-speech Pathology  07/01/2013   Eric Oneill, CCC-SLP     07/01/2013  1:21 PM Objective Swallowing Evaluation: Modified Barium Swallowing Study   Patient Details  Name: Eric Oneill MRN: [id number] Date of Birth: [birth year]  Today's Date: 07/01/2013 Time: 1150-1220 SLP Time Calculation (min): 30 min  Past Medical History:  Past Medical History  Diagnosis Date  . Coronary artery disease   . Parkinson disease     dx: 06/2007  . Depression    Past  Surgical History:  Past Surgical History  Procedure Laterality Date  . Coronary artery bypass graft    . Hernia repair      x2  . Circumcision  Aug 2002  . Carpal tunnel release      L  . Knee surgery      bilateral   HPI:  79 yo male adm to [facility] with speech deficits and right sided  weakness.  Pt found to have a left BG hemorrhage.  PMH + for  Parkinson's disease, CAD, depression, dysphagia.  Pt for BSE as  she did not pass a RN stroke swallow screen.      Assessment / Plan / Recommendation Clinical Impression  Dysphagia Diagnosis: Moderate oral phase dysphagia;Severe  pharyngeal phase dysphagia Clinical impression: Pt. exhibits a moderate to severe  sensori-oromotor dysphagia,  characterized by oral weakness with  difficulty propelling bolus orally, delayed swallow initiation,  decreased hyoid excursion and laryngeal elevation, decreased  tongue base contraction to the pharyngeal wall.  These deficits  result in trace penetration to the cords with honey thick liquids  during the swallow, moderate aspiration of nectar thick liquds  after the swallow from residue in the pyriforms, and laryngeal  stasis with purees, most of which clears with chin tuck and  double swallows.  Pt. was able to follow verbal commands for  compensatory strategies.  Pt. will be able to begin po trials  with SLP only (purees), and will need to proceed with PEG for  main source of nutrition at this time.  Prognosis for return to  po diet is good, with time and swallow retrainning.    Treatment Recommendation  Therapy as outlined in treatment plan below    Diet Recommendation NPO;Alternative means - long-term   Medication Administration: Via alternative means Compensations: Slow rate;Small sips/bites;Multiple dry swallows  after each bite/sip Postural Changes and/or Swallow Maneuvers: Seated upright 90  degrees;Chin tuck    Other  Recommendations Oral Care Recommendations: Oral care Q4  per protocol;Oral care prior to ice chips   Follow Up  Recommendations       Frequency and Duration min 3x week  2 weeks   Pertinent Vitals/Pain N/a    SLP Swallow Goals  See care plan   General Date of Onset: 06/26/13 HPI: 79 yo male adm to [facility] with speech deficits and right sided  weakness.  Pt found to have a left BG hemorrhage.  PMH +  for  Parkinson's disease, CAD, depression, dysphagia.  Pt for BSE as  she did not pass a RN stroke swallow screen.  Type of Study: Modified Barium Swallowing Study Reason for Referral: Objectively evaluate swallowing function Previous Swallow Assessment: MBS:  12/2012 OUTPT, mild  oropharyngeal dysphagia without aspiration of any consistency  tested.  Rec soft/ground meats/thin, stasis, penetration of thin  x1 of 10   BSE 11/27, NPO Diet Prior to this Study: NPO;Panda Temperature Spikes Noted: No Respiratory Status: Nasal cannula Behavior/Cognition: Alert;Cooperative;Requires cueing Oral Cavity - Dentition: Adequate natural dentition Self-Feeding Abilities: Total assist Patient Positioning: Upright in chair Baseline Vocal Quality: Aphonic Volitional Cough: Cognitively unable to elicit Anatomy: Within functional limits Pharyngeal Secretions: Not observed secondary MBS    Reason for Referral Objectively evaluate swallowing function   Oral Phase Oral Preparation/Oral Phase Oral Phase: Impaired Oral - Solids Oral - Puree: Right anterior bolus loss;Weak lingual  manipulation;Incomplete tongue to palate contact;Reduced  posterior propulsion;Delayed oral transit   Pharyngeal Phase Pharyngeal Phase Pharyngeal Phase: Impaired Pharyngeal - Honey Pharyngeal - Honey Teaspoon: Delayed swallow initiation;Premature  spillage to valleculae;Premature spillage to pyriform  sinuses;Reduced pharyngeal peristalsis;Reduced anterior laryngeal  mobility;Reduced laryngeal elevation;Reduced tongue base  retraction;Penetration/Aspiration during swallow;Compensatory  strategies attempted (Comment) (Chin tuck and double swallows) Penetration/Aspiration details  (honey teaspoon): Material enters  airway, CONTACTS cords and not ejected out Pharyngeal - Nectar Pharyngeal - Nectar Teaspoon: Delayed swallow  initiation;Premature spillage to pyriform sinuses;Reduced  anterior laryngeal mobility;Reduced laryngeal elevation;Reduced  tongue base retraction;Penetration/Aspiration after  swallow;Moderate aspiration;Pharyngeal residue -  valleculae;Pharyngeal residue - pyriform sinuses;Compensatory  strategies attempted (Comment) (chin tuck, double swallows) Penetration/Aspiration details (nectar teaspoon): Material enters  airway, passes BELOW cords and not ejected out despite cough  attempt by patient Pharyngeal - Solids Pharyngeal - Puree: Delayed swallow initiation;Premature spillage  to valleculae;Reduced anterior laryngeal mobility;Reduced  laryngeal elevation;Reduced tongue base retraction;Pharyngeal  residue - valleculae;Compensatory strategies attempted (Comment)  (Chin tuck and double swallows)  Cervical Esophageal Phase    GO    Cervical Esophageal Phase Cervical Esophageal Phase: Vicente Masson T 07/01/2013, 1:20 PM     Review of Systems  Unable to perform ROS: mental acuity   Blood pressure 172/80, pulse 60, temperature 98.1 F (36.7 C), temperature source Oral, resp. rate 14, height 5\' 9"  (1.753 m), weight 206 lb 9.1 oz (93.7 kg), SpO2 100.00%. Physical Exam  Constitutional: He appears well-developed and well-nourished. No distress.  HENT:  Head: Normocephalic and atraumatic.  Eyes: Conjunctivae are normal. Pupils are equal, round, and reactive to light.  Neck: Neck supple.  Cardiovascular: Normal rate, regular rhythm, normal heart sounds and intact distal pulses.  Exam reveals no gallop and no friction rub.   No murmur heard. Respiratory: Effort normal and breath sounds normal. No respiratory distress. He has no wheezes. He has no rales.  GI: Soft. Bowel sounds are normal. He exhibits no distension. There is no tenderness.  Musculoskeletal: He  exhibits no edema.  Neurological:  Nonverbal  Skin: Skin is warm and dry. He is not diaphoretic.    Assessment/Plan: Dysphagia -- I have tentatively scheduled a PEG for tomorrow afternoon. This will be dependent on Dr. Dixon Boos schedule and examination of the patient (he is unavailable today).     Freeman Caldron, PA-C Pager: [phone] General Trauma PA Pager: [phone]  07/01/2013, 2:31 PM

## 2013-07-01 NOTE — Progress Notes (Addendum)
I await [employer] medicare decision for rehab venue of inpt rehab vs SNF. I discussed with his daughter, Dois Davenport, by phone.Noted plans for PEG tomorrow. [facility] Medicare physician to call Dr. Pearlean Brownie tomorrow to discuss inpt rehab request. [phone]

## 2013-07-01 NOTE — Progress Notes (Signed)
NG tube D/C per MD order for swallow study

## 2013-07-01 NOTE — Progress Notes (Signed)
Speech Language Pathology Treatment: Dysphagia  Patient Details Name: Eric Oneill MRN: [id number] DOB: 1927/10/21 Today's Date: 07/01/2013 Time: 1478-2956 SLP Time Calculation (min): 41 min  Assessment / Plan / Recommendation Clinical Impression  Skilled observation of ability to manipulate oral bolus for readiness for MBS.  Discussed case with MD, who reports pt. Will be scheduled for a PEG in next day or 2 if unable to initiate po diet.  Agrees to d/c large bore NG tube prior to [facility] for optimal results possible.  Pt. Is more alert and was able to move pureed bolus through the oral cavity.  Swallow initiation appears to be delayed and laryngeal elevation was inconsistently weak upon palpitation.  Pt. Appears appropriate to proceed with MBS in hopes of intiating safe po's and avoiding PEG.  Daughter educated and agrees with plan.   HPI HPI: 79 yo male adm to [facility] with speech deficits and right sided weakness.  Pt found to have a left BG hemorrhage.  PMH + for Parkinson's disease, CAD, depression, dysphagia.  Pt for BSE as she did not pass a RN stroke swallow screen.    Pertinent Vitals afebrile  SLP Plan  MBS;New goals to be determined pending instrumental study    Recommendations Diet recommendations: NPO Medication Administration: Via alternative means              Plan: MBS;New goals to be determined pending instrumental study    GO     Maryjo Rochester T 07/01/2013, 10:32 AM

## 2013-07-01 NOTE — Progress Notes (Signed)
Physical Therapy Treatment Patient Details Name: Eric Oneill MRN: [id number] DOB: 1928/04/02 Today's Date: 07/01/2013 Time: 2956-2130 PT Time Calculation (min): 31 min  PT Assessment / Plan / Recommendation  History of Present Illness Eric Oneill is an 79 y.o. male with a history of hypertension, hyperlipidemia, coronary artery disease, and Parkinson's disease, presenting with new onset speech difficulty and right-sided weakness. Onset was 8:30 PM. There is no previous history of stroke nor TIA. Patient has been taking aspirin 81 mg per day. CT scan of his head showed left basal ganglia acute intraparenchymal hemorrhage. There was no ventricular extension. NIH stroke score was 12.     PT Comments   Pt demonstrated bed mobility and transfers today with +2A.  Pt able to shake head to respond to Y/N questions with increased time.  Pt fatigued with transfers and Barium Swallow do be completed at 11:30 thus pt returned to bed post tx. Pt will benefit from CIR to increase functional I prior to d/c home.  Pt's daughter present during therapy and supportive/motivating of pt.  Follow Up Recommendations  CIR     Does the patient have the potential to tolerate intense rehabilitation     Barriers to Discharge        Equipment Recommendations       Recommendations for Other Services Rehab consult  Frequency Min 4X/week   Progress towards PT Goals Progress towards PT goals: Progressing toward goals  Plan Current plan remains appropriate    Precautions / Restrictions Precautions Precautions: Fall Precaution Comments: posterior lean. L bias Restrictions Weight Bearing Restrictions: No   Pertinent Vitals/Pain no apparent distress     Mobility  Bed Mobility Bed Mobility: Sit to Supine;Supine to Sit;Rolling Right;Rolling Left Rolling Right: 1: +1 Total assist Rolling Left: 1: +1 Total assist Supine to Sit: 1: +2 Total assist;HOB elevated Supine to Sit: Patient Percentage:  10% Sitting - Scoot to Edge of Bed: 1: +1 Total assist Sit to Supine: 1: +2 Total assist Sit to Supine: Patient Percentage: 10% Details for Bed Mobility Assistance: Pt pushed backwards with cues to lean forwards; pt then pushed to the L with max cues for upright posture Transfers Transfers: Sit to Stand;Stand to Sit Sit to Stand: From bed;1: +2 Total assist Sit to Stand: Patient Percentage: 10% Stand to Sit: To bed;1: +2 Total assist Stand to Sit: Patient Percentage: 10% Details for Transfer Assistance: partial sit<>stand x3 with bed pad and gait belt utilized to come to full standing; Assist also to block R knee    Exercises     PT Diagnosis:    PT Problem List:   PT Treatment Interventions:     PT Goals (current goals can now be found in the care plan section)    Visit Information  Last PT Received On: 07/01/13 Assistance Needed: +2 History of Present Illness: SUZANNE GARBERS is an 79 y.o. male with a history of hypertension, hyperlipidemia, coronary artery disease, and Parkinson's disease, presenting with new onset speech difficulty and right-sided weakness. Onset was 8:30 PM. There is no previous history of stroke nor TIA. Patient has been taking aspirin 81 mg per day. CT scan of his head showed left basal ganglia acute intraparenchymal hemorrhage. There was no ventricular extension. NIH stroke score was 12.      Subjective Data      Cognition  Cognition Arousal/Alertness: Awake/alert Behavior During Therapy: Flat affect Overall Cognitive Status: Impaired/Different from baseline Area of Impairment: Following commands;Problem solving;Attention Current Attention Level:  Focused Following Commands: Follows one step commands with increased time Problem Solving: Slow processing;Difficulty sequencing;Requires verbal cues;Requires tactile cues;Decreased initiation    Balance     End of Session PT - End of Session Equipment Utilized During Treatment: Gait belt Activity  Tolerance: Patient limited by fatigue Patient left: in bed;with call bell/phone within reach;with bed alarm set;with family/visitor present Nurse Communication: Mobility status   GP     Ernestina Columbia, SPTA 07/01/2013, 11:16 AM

## 2013-07-01 NOTE — Consult Note (Signed)
Agree with above 

## 2013-07-01 NOTE — Progress Notes (Signed)
Placed a 17fr. NGT without difficulty, verified placement with second RN.

## 2013-07-02 ENCOUNTER — Encounter (HOSPITAL_COMMUNITY)
Admission: EM | Disposition: A | Discharge status: Skilled Nursing Facility | Payer: Self-pay | Source: Home / Self Care | Attending: Neurology

## 2013-07-02 ENCOUNTER — Encounter (HOSPITAL_COMMUNITY): Payer: Self-pay | Admitting: General Practice

## 2013-07-02 HISTORY — PX: PEG PLACEMENT: SHX5437

## 2013-07-02 LAB — GLUCOSE, CAPILLARY
Glucose-Capillary: 100 mg/dL — ABNORMAL HIGH (ref 70–99)
Glucose-Capillary: 105 mg/dL — ABNORMAL HIGH (ref 70–99)
Glucose-Capillary: 111 mg/dL — ABNORMAL HIGH (ref 70–99)
Glucose-Capillary: 98 mg/dL (ref 70–99)
Glucose-Capillary: 99 mg/dL (ref 70–99)
Glucose-Capillary: 99 mg/dL (ref 70–99)

## 2013-07-02 SURGERY — Surgical Case
Anesthesia: *Unknown

## 2013-07-02 SURGERY — INSERTION, PEG TUBE

## 2013-07-02 MED ORDER — CEFAZOLIN SODIUM-DEXTROSE 2-3 GM-% IV SOLR
2.0000 g | INTRAVENOUS | Status: AC
  Administered 2013-07-02: 2 g via INTRAVENOUS
  Filled 2013-07-02: qty 50

## 2013-07-02 MED ORDER — FENTANYL CITRATE 0.05 MG/ML IJ SOLN
INTRAMUSCULAR | Status: AC
  Filled 2013-07-02: qty 2

## 2013-07-02 MED ORDER — FENTANYL CITRATE 0.05 MG/ML IJ SOLN
INTRAMUSCULAR | Status: DC | PRN
  Administered 2013-07-02 (×2): 25 ug via INTRAVENOUS

## 2013-07-02 MED ORDER — MIDAZOLAM HCL 5 MG/ML IJ SOLN
INTRAMUSCULAR | Status: AC
  Filled 2013-07-02: qty 2

## 2013-07-02 MED ORDER — MIDAZOLAM HCL 10 MG/2ML IJ SOLN
INTRAMUSCULAR | Status: DC | PRN
  Administered 2013-07-02: 2 mg via INTRAVENOUS

## 2013-07-02 NOTE — Progress Notes (Signed)
Wrist restraints applied during procedure and removed afterwards. PEG site dressed with drains  Sponges.                  Peg

## 2013-07-02 NOTE — Progress Notes (Signed)
[employer] will not approve inpt rehab inpt rehab at this time. SNF recommended. I spoke with Dois Davenport, his daughter and she is aware. RNCM notified. [phone]

## 2013-07-02 NOTE — Progress Notes (Signed)
Stroke Team Progress Note  HISTORY Eric Oneill is an 79 y.o. male with a history of hypertension, hyperlipidemia, coronary artery disease, and Parkinson's disease, presenting with new onset speech difficulty and right-sided weakness. Onset was 8:30 PM. There is no previous history of stroke nor TIA. Patient has been taking aspirin 81 mg per day. CT scan of his head showed left basal ganglia acute intraparenchymal hemorrhage. There was no ventricular extension. NIH stroke score was 12.   LSN: 8:30 PM on 06/25/2013  tPA Given: No: ICH  MRankin: 3    Patient was not a TPA candidate secondary to ICH.   SUBJECTIVE Dense right hemiplegia. Awake. For PEG today.   OBJECTIVE Most recent Vital Signs: Filed Vitals:   07/01/13 2230 07/02/13 0055 07/02/13 0400 07/02/13 0500  BP: 147/63 157/71 153/73   Pulse: 68 75 62   Temp: 98 F (36.7 C) 99.6 F (37.6 C) 99.2 F (37.3 C)   TempSrc: Oral Oral Oral   Resp:  20 18   Height:      Weight:    93.441 kg (206 lb)  SpO2: 94% 96% 92%    CBG (last 3)   Recent Labs  07/01/13 2229 07/02/13 0056 07/02/13 0354  GLUCAP 124* 105* 98    IV Fluid Intake:   . sodium chloride 20 mL/hr at 06/30/13 2253  . feeding supplement (JEVITY 1.2 CAL) 1,000 mL (07/01/13 0501)    MEDICATIONS  . antiseptic oral rinse  15 mL Mouth Rinse q12n4p  . carbidopa-levodopa  1.5 tablet Per NG tube Q6H  . chlorhexidine  15 mL Mouth Rinse BID  . free water  300 mL Per Tube Q6H  . insulin aspart  0-9 Units Subcutaneous Q4H  . pantoprazole (PROTONIX) IV  40 mg Intravenous QHS  . senna-docusate  1 tablet Oral BID   PRN:  acetaminophen, acetaminophen, hydrALAZINE, labetalol  Diet:  NPO panda feeds changing to PEG today 07/02/2013 Activity:  Activity as tolerated DVT Prophylaxis:  SCD  CLINICALLY SIGNIFICANT STUDIES Basic Metabolic Panel:   Recent Labs Lab 06/28/13 0431 06/29/13 0455  NA 133* 133*  K 3.8 3.5  CL 98 102  CO2 19 21  GLUCOSE 110* 125*  BUN  24* 39*  CREATININE 0.83 0.83  CALCIUM 9.7 9.4   Liver Function Tests:   Recent Labs Lab 06/25/13 2129 06/27/13 0340  AST 21 29  ALT <5 16  ALKPHOS 57 47  BILITOT 0.5 1.4*  PROT 7.0 6.2  ALBUMIN 4.2 3.6   CBC:   Recent Labs Lab 06/25/13 2129  06/27/13 0340 06/29/13 0455  WBC 8.7  --  9.8 9.3  NEUTROABS 5.5  --  6.8  --   HGB 15.4  < > 14.3 13.9  HCT 44.9  < > 41.7 40.3  MCV 90.2  --  89.1 87.0  PLT 209  --  197 201  < > = values in this interval not displayed. Coagulation:   Recent Labs Lab 06/25/13 2129  LABPROT 13.8  INR 1.08   Cardiac Enzymes:   Recent Labs Lab 06/25/13 2129  TROPONINI <0.30   Urinalysis:   Recent Labs Lab 06/26/13 0101 06/28/13 0904  COLORURINE YELLOW YELLOW  LABSPEC 1.016 1.022  PHURINE 6.5 5.5  GLUCOSEU NEGATIVE NEGATIVE  HGBUR NEGATIVE SMALL*  BILIRUBINUR NEGATIVE NEGATIVE  KETONESUR NEGATIVE >80*  PROTEINUR NEGATIVE 30*  UROBILINOGEN 0.2 0.2  NITRITE NEGATIVE NEGATIVE  LEUKOCYTESUR NEGATIVE NEGATIVE   Lipid Panel No results found for this basename: chol,  trig,  hdl,  cholhdl,  vldl,  ldlcalc   HgbA1C  No results found for this basename: HGBA1C    Urine Drug Screen:     Component Value Date/Time   LABOPIA NONE DETECTED 06/26/2013 0101   COCAINSCRNUR NONE DETECTED 06/26/2013 0101   LABBENZ NONE DETECTED 06/26/2013 0101   AMPHETMU NONE DETECTED 06/26/2013 0101   THCU NONE DETECTED 06/26/2013 0101   LABBARB NONE DETECTED 06/26/2013 0101    Alcohol Level:   Recent Labs Lab 06/25/13 2129  ETH <11    Ct Head Wo Contrast 06/30/2013    No significant change in appearance of left basal ganglia hemorrhage and associated mass effect with similar degree of midline shift to the right.   Dg Chest Port 1 View 06/28/2013   CLINICAL DATA:  Fever.  EXAM: PORTABLE CHEST - 1 VIEW  COMPARISON:  09/03/2009.  FINDINGS: Prior CABG. Cardiomegaly. Normal pulmonary vascularity. Lungs clear. No pleural effusion pneumothorax. No  acute osseous abnormality.  IMPRESSION: Prior CABG. Stable cardiomegaly. No CHF. No acute cardiopulmonary disease.   Electronically Signed   By: Maisie Fus  Register   On: 06/28/2013 11:11   Dg Abd Portable 1v 06/28/2013   CLINICAL DATA:  Evaluate feeding tube placement.  EXAM: PORTABLE ABDOMEN - 1 VIEW  COMPARISON:  Chest radiograph 06/28/2013  FINDINGS: There is feeding tube tip in the right upper abdomen. The tip is probably in the region of the duodenum bulb. Calcifications in the aorta. Degenerative changes in the lower lumbar spine. Nonspecific bowel gas pattern.  IMPRESSION: Feeding tube tip is in the region of the proximal duodenum.   Electronically Signed   By: Richarda Overlie M.D.   On: 06/28/2013 12:40    CT of the brain   06/27/2013 1. Left basal ganglia hematoma with 3 mm left-to-right midline  shift. Critical Value/emergent results were called by telephone at  the time of interpretation on 06/25/2013 at 9:45 PM to Dr.Stewart,  who verbally acknowledged these results.  2. Atrophy and chronic microvascular white matter ischemic changes.  CT head  06/27/13 No significant interval change in size of left basal ganglia intraparenchymal hemorrhage with stable 6 mm of left-to-right midline shift. No hydrocephalus. No new hemorrhage.  06/30/2013 No significant change in appearance of left basal ganglia hemorrhage and associated mass effect with similar degree of midline shift to the right. Please see above.  MRI of the brain    MRA of the brain    2D Echocardiogram    Carotid Doppler    CXR    EKG   Sinus rhythm Multiform ventricular premature complexes Prolonged PR interval Right bundle branch block  Therapy Recommendations CIR  Physical Exam  General: The patient is sleepy, but can be aroused.  Respiratory: Lung fields are clear.  Cardiovascular: There is a regular rate and rhythm, no obvious murmurs or rubs are noted.  Abdomen: Soft nontender, positive bowel sounds.  Skin:  No significant peripheral edema is noted.   Neurologic Exam  Mental status: The patient is arousable, will follow commands.  Cranial nerves: Facial symmetry is not resent, with depression of the right nasolabial fold. Gaze preference is to the left, but the patient is able to deviate the eyes to the right. The patient will blink to threat from the left, minimal blink on the right. The patient is minimally verbal, will occasionally attempt to whisper some words. Severely dysarthric and dysphonic.  Motor: The patient has a dense right hemiparesis, arm greater than leg. The patient moves  the left side.  Sensory examination: The patient has some response to deep pain stimulation on all fours.  Coordination: There does not appear to be dysmetria with the use of the left arm and leg, weakness with the right side. Resting tremors noted on the left arm.  Gait and station: The gait cannot be tested.  Reflexes: Deep tendon reflexes are symmetric, slightly depressed. Bilateral Babinski's noted, more prominent on the right.    ASSESSMENT Mr. Eric Oneill is a 79 y.o. male presenting with left BG ICH, hypertensive in nature . No intraventricular extension. Patient was on baby aspirin 81mg  daily prior to admission. There is a pre-existing history of Parkinson's disease and coronary artery disease. The patient has a right hemiparesis, left gaze preference. Some worsening of mental status overnight. The patient is not a TPA candidate secondary to hemorrhage.   Parkinson's disease  Accelerated hypertension, blood pressure had decreased, BP meds stopped, stable.  Left BG ICH  Dyslipidemia  Coronary artery disease  Calorie malnutrition: tube feedings started.  Vasogenic/cytotoxic, mild edema  Hospital day # 7  CT the head shows stability . The patient is quite dysarthric, not clearly understanding language completely. The patient is trying to verbalize, will  follow commands.  BP meds  discontinued after sinemet was started, as BP has dropped significantly.  TREATMENT/PLAN  Hold aspirin due to hemorrhage  Patient now for SNF. When able to safely use PEG, could be transferred most likely 07/03/2013 afternoon if ok with surgeon.  Continue to work with therapy  Remains NPO--->panda. Failed swallow re-test: PEG Planned for 07/02/2013  Gwendolyn Lima. Manson Passey, [facility], MBA, MHA [facility] Pager: [phone] 07/02/2013 8:24 AM  I have personally obtained a history, examined the patient, evaluated imaging results, and formulated the assessment and plan of care. I agree with the above. Delia Heady, MD

## 2013-07-02 NOTE — Op Note (Addendum)
OPERATIVE REPORT  DATE OF OPERATION: 07/02/2013  PATIENT:  Eric Oneill  79 y.o. male  PRE-OPERATIVE DIAGNOSIS:  .Inability to eat secondary to hemorrhagic CVA  POST-OPERATIVE DIAGNOSIS:  Same  PROCEDURE:  Procedure(s): PERCUTANEOUS ENDOSCOPIC GASTROSTOMY (PEG) PLACEMENT  SURGEON:  Surgeon(s): Cherylynn Ridges, MD  ASSISTANT: Leotis Shames, PA-C, Blue, PA-S  ANESTHESIA:   IV sedation and Fentanyl 50 mcg, Versed 2 mg  EBL: <10 ml  BLOOD ADMINISTERED: none  DRAINS: Gastrostomy Tube   SPECIMEN:  No Specimen  COUNTS CORRECT:  YES  PROCEDURE DETAILS: The patient was taken to the endoscopy suite where a proper time out was performed identifying the patient and the procedure to be performed. He was given fentanyl and Versed for IV sedation. A Pentax [id number] endoscope #[id number] was passed oral pharyngeally into the patient's upper esophagus where pictures were taken of the esophagus, the stomach, the duodenum and subsequently the gastrostomy tube once it was placed. Once we passed the scope into the pylorus into the duodenum we retracted back into the body of the stomach where we could see the impulse of the assistant's finger on the anterior abdominal wall at the site where the G-tube was be placed. Subsequently an incision was made on the skin where an angiocatheter was subsequently passed into the stomach under direct vision. Through the angiocatheter a looped the blue wire was passed which was grasped with a snare brought out through the patient's mouth using the endoscope. We put that through the patient's mouth and looped around the pull-through gastrostomy tube which subsequently passed back through the patient's oropharynx off to the anterior abdominal wall where the bolster stopped it at the stomach surface.  Pictures were taken of the G-tube in proper position. Once this was done we retrieved the endoscope aspirating the gas from the stomach along the way.  The gastrostomy tube was secured in  place in the usual manner. All counts were correct.  PATIENT DISPOSITION:  PACU - hemodynamically stable.   Levia Waltermire O 12/3/20142:30 PM

## 2013-07-02 NOTE — Progress Notes (Signed)
The patient has significant swallowing difficulties status post  hemmorrhagic CVA.  Needs PEG for nutritional support.  Eric Oneill. [name], MD, FACS [phone] [facility]

## 2013-07-02 NOTE — Progress Notes (Signed)
PT Cancellation Note  Patient Details Name: Eric Oneill MRN: [id number] DOB: 11-29-1927   Cancelled Treatment:    Reason Eval/Treat Not Completed: Patient at procedure or test/unavailable. Patient going for PEG placement today. Will follow as appropriate   Fredrich Birks 07/02/2013, 1:43 PM

## 2013-07-03 ENCOUNTER — Encounter (HOSPITAL_COMMUNITY): Payer: Self-pay | Admitting: General Surgery

## 2013-07-03 DIAGNOSIS — E43 Unspecified severe protein-calorie malnutrition: Secondary | ICD-10-CM | POA: Diagnosis present

## 2013-07-03 DIAGNOSIS — Z79899 Other long term (current) drug therapy: Secondary | ICD-10-CM

## 2013-07-03 DIAGNOSIS — I10 Essential (primary) hypertension: Secondary | ICD-10-CM | POA: Diagnosis present

## 2013-07-03 DIAGNOSIS — E785 Hyperlipidemia, unspecified: Secondary | ICD-10-CM | POA: Diagnosis present

## 2013-07-03 DIAGNOSIS — G936 Cerebral edema: Secondary | ICD-10-CM

## 2013-07-03 LAB — GLUCOSE, CAPILLARY: Glucose-Capillary: 93 mg/dL (ref 70–99)

## 2013-07-03 MED ORDER — JEVITY 1.2 CAL PO LIQD: 1000.0000 mL | ORAL | Status: DC

## 2013-07-03 MED ORDER — PANTOPRAZOLE SODIUM 40 MG IV SOLR: 40.0000 mg | Freq: Every day | INTRAVENOUS | Status: DC

## 2013-07-03 NOTE — Progress Notes (Signed)
Occupational Therapy Treatment Patient Details Name: Eric Oneill MRN: [id number] DOB: 1928/04/12 Today's Date: 07/03/2013 Time: 4098-1191 OT Time Calculation (min): 18 min  OT Assessment / Plan / Recommendation  History of present illness Eric Oneill is an 79 y.o. male with a history of hypertension, hyperlipidemia, coronary artery disease, and Parkinson's disease, presenting with new onset speech difficulty and right-sided weakness. Onset was 8:30 PM. There is no previous history of stroke nor TIA. Patient has been taking aspirin 81 mg per day. CT scan of his head showed left basal ganglia acute intraparenchymal hemorrhage. There was no ventricular extension. NIH stroke score was 12.     OT comments  Pt fatigued and up in chair since 10AM. Pt returned to supine on overlay mattress to decr risk for skin break down. Pt currently with redness at sacrum and barrier cream applied. Pt is high risk for skin break down and should have change of position every 2 hours.   Follow Up Recommendations  CIR    Barriers to Discharge       Equipment Recommendations  3 in 1 bedside comode;Tub/shower bench    Recommendations for Other Services Rehab consult  Frequency Min 3X/week   Progress towards OT Goals Progress towards OT goals: Progressing toward goals  Plan Discharge plan remains appropriate    Precautions / Restrictions Precautions Precautions: Fall Precaution Comments: abdominal binder, peg, IV, condom foley Restrictions Weight Bearing Restrictions: No   Pertinent Vitals/Pain None reported Pt reaching for therapist at end of session and attempting to talk. Speech unintelliglbe    ADL  Eating/Feeding: NPO Toilet Transfer: Simulated;+2 Total assistance Toilet Transfer: Patient Percentage: 40% Toilet Transfer Method: Sit to stand Toilet Transfer Equipment: Raised toilet seat with arms (or 3-in-1 over toilet) (pad used ) Equipment Used: Gait belt;Other (comment) (pad used to  (A) with hip extension) Transfers/Ambulation Related to ADLs: Pt completing transfer sit<>Stand and squat pivot to the left side ADL Comments: pt using Lt Ue to pull on chair arm rest for supported sitting to unsupported sitting. pt sit<>stand with static standing ~30 second with pad. OT and tech using pad to help (A) with pivot to chair. Pt able to complete task with total +2 (A). pt positioned in supine and giving thumbs up in response to questions. pt with delayed response to all questions. pt s daughter present during session and very eager to participate in session    OT Diagnosis:    OT Problem List:   OT Treatment Interventions:     OT Goals(current goals can now be found in the care plan section) Acute Rehab OT Goals Patient Stated Goal: unable to state OT Goal Formulation: With family Time For Goal Achievement: 07/11/13 Potential to Achieve Goals: Good ADL Goals Pt Will Perform Grooming: with set-up;with supervision;sitting Pt Will Perform Upper Body Bathing: with min assist;sitting Pt Will Perform Upper Body Dressing: with min assist;sitting Pt Will Transfer to Toilet: with min assist;stand pivot transfer;bedside commode Pt Will Perform Toileting - Clothing Manipulation and hygiene: with min assist;sit to/from stand Pt/caregiver will Perform Home Exercise Program: Right Upper extremity;With minimal assist Additional ADL Goal #1: locate 3 items in R visual field with min vc  Visit Information  Last OT Received On: 07/03/13 Assistance Needed: +2 (+3 or more for ambulation) History of Present Illness: Eric Oneill is an 79 y.o. male with a history of hypertension, hyperlipidemia, coronary artery disease, and Parkinson's disease, presenting with new onset speech difficulty and right-sided weakness. Onset was 8:30  PM. There is no previous history of stroke nor TIA. Patient has been taking aspirin 81 mg per day. CT scan of his head showed left basal ganglia acute intraparenchymal  hemorrhage. There was no ventricular extension. NIH stroke score was 12.      Subjective Data      Prior Functioning       Cognition  Cognition Arousal/Alertness: Awake/alert Behavior During Therapy: Flat affect Overall Cognitive Status: Impaired/Different from baseline Area of Impairment: Following commands Current Attention Level: Focused Following Commands: Follows one step commands with increased time Awareness: Intellectual Problem Solving: Slow processing    Mobility  Bed Mobility Bed Mobility: Sit to Supine Rolling Right: 1: +2 Total assist Rolling Right: Patient Percentage: 20% Right Sidelying to Sit: 1: +2 Total assist Right Sidelying to Sit: Patient Percentage: 20% Sitting - Scoot to Edge of Bed: 1: +2 Total assist Sitting - Scoot to Edge of Bed: Patient Percentage: 20% Sit to Supine: 1: +2 Total assist;HOB flat Sit to Supine: Patient Percentage: 10% Details for Bed Mobility Assistance: pt initiated reaching with LT UE toward bed surface and total (A) for bil LE Transfers Transfers: Sit to Stand;Stand to Sit Sit to Stand: 1: +2 Total assist;From chair/3-in-1 Sit to Stand: Patient Percentage: 30% Stand to Sit: 1: +2 Total assist;To bed Stand to Sit: Patient Percentage: 0% Details for Transfer Assistance: Pt stand pivot to the bed transfering to the left side    Exercises      Balance Static Sitting Balance Static Sitting - Balance Support: Feet supported Static Sitting - Level of Assistance: 2: Max assist   End of Session OT - End of Session Activity Tolerance: Patient tolerated treatment well Patient left: in bed;with call bell/phone within reach;with family/visitor present Nurse Communication: Mobility status;Precautions  GO     Harolyn Rutherford 07/03/2013, 1:51 PM Pager: [phone]

## 2013-07-03 NOTE — Progress Notes (Signed)
Patient started having increasing coughing  Without secretions, looked uncomfortable but aphasia so RN could not get him to answer certain questions.Vitals stable, 100% RA and tube feeding has been put to hold for now. Incoming nurse aware and MD on call will be notified.

## 2013-07-03 NOTE — Progress Notes (Signed)
Binder was open - abdomen and PEG site benign. I secured the binder and spoke to his family member. Patient examined and I agree with the assessment and plan  Violeta Gelinas, MD, MPH, FACS Pager: [phone]  07/03/2013 1:39 PM

## 2013-07-03 NOTE — Progress Notes (Signed)
Agree with SPTA.    Artice Bergerson, PT [phone]  

## 2013-07-03 NOTE — Progress Notes (Signed)
Per facility, they will not be able to accept Pt until tomorrow morning in order to set up feedings and medications. Facility stated that the family also feels that tomorrow morning would be better.    CSW will notify unit nurse and CM.  Pt to d/c tomorrow to facility.   CSW will continue to follow Pt for d/c planning.    Leron Croak [facility]  [address]N 1[address]16;  [phone] Phone: [phone]

## 2013-07-03 NOTE — Discharge Summary (Addendum)
Stroke Discharge Summary  Patient ID: Eric Oneill   MRN: [id number]      DOB: May 07, 1928  Date of Admission: 06/25/2013 Date of Discharge: 07/03/2013  Attending Physician:  Darcella Cheshire, MD, Stroke MD  Consulting Physician(s):    Trauma/General surgeon for PEG Patient's PCP:  [name], MD  Discharge Diagnoses:  Principal Problem:  Left basal ganglia intracerebral hemorrhage due to accelerated hypertension Active Problems:   PD (Parkinson's disease)   Hematoma of brain   Brain hematoma   Hypertension   Accelerated hypertension   Dyslipidemia, goal LDL below 100   Vasogenic brain edema   Protein-calorie malnutrition, severe   Encounter for long-term (current) use of medications  BMI: Body mass index is 26.86 kg/(m^2).  Past Medical History  Diagnosis Date  . Coronary artery disease   . Parkinson disease     dx: 06/2007  . Depression    Past Surgical History  Procedure Laterality Date  . Coronary artery bypass graft    . Hernia repair      x2  . Circumcision  Aug 2002  . Carpal tunnel release      L  . Knee surgery      bilateral  . Peg placement N/A 07/02/2013    Procedure: PERCUTANEOUS ENDOSCOPIC GASTROSTOMY (PEG) PLACEMENT;  Surgeon: Cherylynn Ridges, MD;  Location: [facility];  Service: General;  Laterality: N/A;      Medication List    STOP taking these medications       aspirin EC 81 MG tablet     Cholecalciferol 1000 UNITS Tbdp     ibuprofen 200 MG tablet  Commonly known as:  ADVIL,MOTRIN     lisinopril 20 MG tablet  Commonly known as:  PRINIVIL,ZESTRIL     selegiline 5 MG tablet  Commonly known as:  ELDEPRYL      TAKE these medications       carbidopa-levodopa 50-200 MG per tablet  Commonly known as:  SINEMET CR  Take 1 tablet by mouth at bedtime.     carbidopa-levodopa 25-100 MG per tablet  Commonly known as:  SINEMET IR  Take 1.5 tablets by mouth 4 (four) times daily.     escitalopram 10 MG tablet   Commonly known as:  LEXAPRO  Take 1 tablet (10 mg total) by mouth daily.     feeding supplement (JEVITY 1.2 CAL) Liqd  Place 1,000 mLs into feeding tube continuous.     pantoprazole 40 MG injection  Commonly known as:  PROTONIX  Inject 40 mg into the vein at bedtime.     simvastatin 40 MG tablet  Commonly known as:  ZOCOR  Take 40 mg by mouth at bedtime.        LABORATORY STUDIES CBC    Component Value Date/Time   WBC 9.3 06/29/2013 0455   RBC 4.63 06/29/2013 0455   HGB 13.9 06/29/2013 0455   HCT 40.3 06/29/2013 0455   PLT 201 06/29/2013 0455   MCV 87.0 06/29/2013 0455   MCH 30.0 06/29/2013 0455   MCHC 34.5 06/29/2013 0455   RDW 13.4 06/29/2013 0455   LYMPHSABS 1.7 06/27/2013 0340   MONOABS 1.1* 06/27/2013 0340   EOSABS 0.1 06/27/2013 0340   BASOSABS 0.0 06/27/2013 0340   CMP    Component Value Date/Time   NA 133* 06/29/2013 0455   K 3.5 06/29/2013 0455   CL 102 06/29/2013 0455   CO2 21 06/29/2013 0455   GLUCOSE 125* 06/29/2013 0455  BUN 39* 06/29/2013 0455   CREATININE 0.83 06/29/2013 0455   CALCIUM 9.4 06/29/2013 0455   PROT 6.2 06/27/2013 0340   ALBUMIN 3.6 06/27/2013 0340   AST 29 06/27/2013 0340   ALT 16 06/27/2013 0340   ALKPHOS 47 06/27/2013 0340   BILITOT 1.4* 06/27/2013 0340   GFRNONAA 80* 06/29/2013 0455   GFRAA >90 06/29/2013 0455   COAGS Lab Results  Component Value Date   INR 1.08 06/25/2013   Lipid Panel No results found for this basename: chol, trig, hdl, cholhdl, vldl, ldlcalc   HgbA1C  No results found for this basename: HGBA1C   Cardiac Panel (last 3 results) No results found for this basename: CKTOTAL, CKMB, TROPONINI, RELINDX,  in the last 72 hours Urinalysis    Component Value Date/Time   COLORURINE YELLOW 06/28/2013 0904   APPEARANCEUR CLEAR 06/28/2013 0904   LABSPEC 1.022 06/28/2013 0904   PHURINE 5.5 06/28/2013 0904   GLUCOSEU NEGATIVE 06/28/2013 0904   HGBUR SMALL* 06/28/2013 0904   BILIRUBINUR NEGATIVE 06/28/2013  0904   KETONESUR >80* 06/28/2013 0904   PROTEINUR 30* 06/28/2013 0904   UROBILINOGEN 0.2 06/28/2013 0904   NITRITE NEGATIVE 06/28/2013 0904   LEUKOCYTESUR NEGATIVE 06/28/2013 0904   Urine Drug Screen     Component Value Date/Time   LABOPIA NONE DETECTED 06/26/2013 0101   COCAINSCRNUR NONE DETECTED 06/26/2013 0101   LABBENZ NONE DETECTED 06/26/2013 0101   AMPHETMU NONE DETECTED 06/26/2013 0101   THCU NONE DETECTED 06/26/2013 0101   LABBARB NONE DETECTED 06/26/2013 0101    Alcohol Level    Component Value Date/Time   ETH <11 06/25/2013 2129     SIGNIFICANT DIAGNOSTIC STUDIES Ct Head Wo Contrast  06/30/2013 No significant change in appearance of left basal ganglia hemorrhage and associated mass effect with similar degree of midline shift to the right.   Dg Chest Port 1 View  06/28/2013 CLINICAL DATA: Fever. EXAM: PORTABLE CHEST - 1 VIEW COMPARISON: 09/03/2009. FINDINGS: Prior CABG. Cardiomegaly. Normal pulmonary vascularity. Lungs clear. No pleural effusion pneumothorax. No acute osseous abnormality. IMPRESSION: Prior CABG. Stable cardiomegaly. No CHF. No acute cardiopulmonary disease. Electronically Signed By: Maisie Fus Register On: 06/28/2013 11:11   Dg Abd Portable 1v  06/28/2013 CLINICAL DATA: Evaluate feeding tube placement. EXAM: PORTABLE ABDOMEN - 1 VIEW COMPARISON: Chest radiograph 06/28/2013 FINDINGS: There is feeding tube tip in the right upper abdomen. The tip is probably in the region of the duodenum bulb. Calcifications in the aorta. Degenerative changes in the lower lumbar spine. Nonspecific bowel gas pattern. IMPRESSION: Feeding tube tip is in the region of the proximal duodenum. Electronically Signed By: Richarda Overlie M.D. On: 06/28/2013 12:40   CT of the brain  06/27/2013  1. Left basal ganglia hematoma with 3 mm left-to-right midline  shift. Critical Value/emergent results were called by telephone at  the time of interpretation on 06/25/2013 at 9:45 PM to Dr.Stewart,   who verbally acknowledged these results.  2. Atrophy and chronic microvascular white matter ischemic changes.   CT head  06/27/13 No significant interval change in size of left basal ganglia intraparenchymal hemorrhage with stable 6 mm of left-to-right midline shift. No hydrocephalus. No new hemorrhage.  06/30/2013 No significant change in appearance of left basal ganglia hemorrhage and associated mass effect with similar degree of midline shift to the right. Please see above.     History of Present Illness    Eric Oneill is an 79 y.o. male with a history of hypertension, hyperlipidemia, coronary artery disease, and Parkinson's  disease, presenting with new onset speech difficulty and right-sided weakness. Onset was 8:30 PM. There is no previous history of stroke nor TIA. Patient has been taking aspirin 81 mg per day. CT scan of his head showed left basal ganglia acute intraparenchymal hemorrhage. There was no ventricular extension. NIH stroke score was 12.   LSN: 8:30 PM on 06/25/2013  tPA Given: No: ICH  MRankin: 3   Patient was not a TPA candidate secondary to ICH.    Hospital Course  Mr. Eric Oneill is a 79 y.o. male presenting with left BG ICH, hypertensive in nature . No intraventricular extension. Patient was on baby aspirin 81mg  daily prior to admission. There is a pre-existing history of Parkinson's disease and coronary artery disease. The patient has a right hemiparesis, left gaze preference. Some transient worsening of mental status.The patient was not a TPA candidate secondary to hemorrhage.  Parkinson's disease  Accelerated hypertension, blood pressure had decreased, BP meds stopped, stable.  Left BG ICH--Hold aspirin/antithrombtics due to hemorrhage Dyslipidemia  Coronary artery disease  Vasogenic/cytotoxic, mild edema  Protein calorie malnutrition, s/p PEG On 07/02/2013 Hospital day # 8  BP meds discontinued after sinemet was started, as BP has dropped  significantly.  TREATMENT/PLAN  Continue to work with therapy  Remains NPO---> PEG 07/02/2013, now cleared to start using site for Jevity feeds this am per surgical team.  Follow up with Dr. Pearlean Brownie in stroke clinic in 2 months.  Follow up with primary MD in 2 weeks for hospital follow up.   Patient with vascular risk factors of:   Hypertension  Dyslipidemia  Coronary artery disease history  Patient with continued stroke symptoms of right hemiparesis, aphasia. Physical therapy, occupational therapy and speech therapy evaluated patient. They recommend SNF as he is not able to participate in full rehab therapy at this time. Patient also has underlying parkinson's disease which will make recovery slower.   Discharge Exam  Blood pressure 156/47, pulse 59, temperature 97.3 F (36.3 C), temperature source Oral, resp. rate 18, height 5\' 9"  (1.753 m), weight 82.555 kg (182 lb), SpO2 98.00%.   Mental status: The patient is arousable, will follow commands.  Cranial nerves: Facial symmetry is not resent, with depression of the right nasolabial fold. Gaze preference is to the left, but the patient is able to deviate the eyes to the right. The patient will blink to threat from the left, minimal blink on the right. The patient is minimally verbal, will occasionally attempt to whisper some words. Severely dysarthric and dysphonic.  Motor: The patient has a dense right hemiparesis, arm greater than leg. The patient moves the left side.  Sensory examination: The patient has some response to deep pain stimulation on all fours.  Coordination: There does not appear to be dysmetria with the use of the left arm and leg, weakness with the right side. Resting tremors noted on the left arm.  Gait and station: The gait cannot be tested.  Reflexes: Deep tendon reflexes are symmetric, slightly depressed. Bilateral Babinski's noted, more prominent on the right  Respiratory: Lung fields are clear.  Cardiovascular:  There is a regular rate and rhythm, no obvious murmurs or rubs are noted.  Abdomen: Soft nontender, positive bowel sounds.  Skin: No significant peripheral edema is noted.   Discharge Diet   NPO  (PEG jevity feeds)  Discharge Plan    Disposition:  SNF   no antithrombotics due to hemorrhage for secondary stroke prevention.  Ongoing risk factor control by Primary  Care Physician. Risk factor recommendations:  Hypertension target range 130-140/70-80 Lipid range - LDL < 100 and checked every 6 months, fasting Diabetes - HgB A1C <7   Follow-up [name], MD in 1 month.  Follow-up with Dr. Delia Heady, Stroke Clinic in 2 months.  40 minutes were spent preparing discharge.  Signed Gwendolyn Lima. Manson Passey, [facility], MBA, MHA [facility] Pager: [phone] 07/03/2013 2:29 PM  I have personally examined this patient, reviewed pertinent data and developed the plan of care. I agree with above. Delia Heady, MD  ADDENDUM Transfer to facility will happen today. Was not able to occur on 07/03/2013.  Patient has remained hemodynamically stable and will be able to be discharged today. No new neurological symptoms. No exam changes. See note from 07/04/2013. Discharge to facility  [facility]. Manson Passey, [facility], MBA, MHA [facility] Pager: [phone] 07/04/2013 10:19 AM Delia Heady, MD

## 2013-07-03 NOTE — Progress Notes (Signed)
Physical Therapy Treatment Patient Details Name: Eric Oneill MRN: [id number] DOB: April 06, 1928 Today's Date: 07/03/2013 Time: 1191-4782 PT Time Calculation (min): 27 min  PT Assessment / Plan / Recommendation  History of Present Illness Eric Oneill is an 79 y.o. male with a history of hypertension, hyperlipidemia, coronary artery disease, and Parkinson's disease, presenting with new onset speech difficulty and right-sided weakness. Onset was 8:30 PM. There is no previous history of stroke nor TIA. Patient has been taking aspirin 81 mg per day. CT scan of his head showed left basal ganglia acute intraparenchymal hemorrhage. There was no ventricular extension. NIH stroke score was 12.     PT Comments   Pt making small improvements with transfers as noted. Pt now planning to d/c to SNF for rehab to increase functional independence.    Follow Up Recommendations  SNF     Does the patient have the potential to tolerate intense rehabilitation     Barriers to Discharge        Equipment Recommendations       Recommendations for Other Services    Frequency Min 3X/week   Progress towards PT Goals Progress towards PT goals: Progressing toward goals (slowly)  Plan Discharge plan needs to be updated;Frequency needs to be updated    Precautions / Restrictions Precautions Precautions: Fall Precaution Comments: posterior lean. L bias Restrictions Weight Bearing Restrictions: No   Pertinent Vitals/Pain Pt gave "thumbs up" throughout tx    Mobility  Bed Mobility Bed Mobility: Rolling Right;Right Sidelying to Sit;Sitting - Scoot to [place] of Bed Rolling Right: 1: +2 Total assist Rolling Right: Patient Percentage: 20% Right Sidelying to Sit: 1: +2 Total assist Right Sidelying to Sit: Patient Percentage: 20% Sitting - Scoot to Edge of Bed: 1: +2 Total assist Sitting - Scoot to Edge of Bed: Patient Percentage: 20% Details for Bed Mobility Assistance: vc's for technique however pt  required +2A to bring trunk upright and take LEs EOB Transfers Transfers: Sit to Stand;Stand to Sit;Squat Pivot Transfers Sit to Stand: From bed;1: +2 Total assist Sit to Stand: Patient Percentage:  (15%) Stand to Sit: 1: +2 Total assist;To chair/3-in-1 Stand to Sit: Patient Percentage:  (15%) Stand Pivot Transfers: Patient Percentage:  (15%) Squat Pivot Transfers:  (+3 A to clear armrest) Details for Transfer Assistance: Pt demonstrated limited improvements from previous tx    Exercises     PT Diagnosis:    PT Problem List:   PT Treatment Interventions:     PT Goals (current goals can now be found in the care plan section)    Visit Information  Last PT Received On: 07/03/13 Assistance Needed: +3 or more History of Present Illness: ELCHONON Eric Oneill is an 79 y.o. male with a history of hypertension, hyperlipidemia, coronary artery disease, and Parkinson's disease, presenting with new onset speech difficulty and right-sided weakness. Onset was 8:30 PM. There is no previous history of stroke nor TIA. Patient has been taking aspirin 81 mg per day. CT scan of his head showed left basal ganglia acute intraparenchymal hemorrhage. There was no ventricular extension. NIH stroke score was 12.      Subjective Data      Cognition  Cognition Arousal/Alertness: Awake/alert Behavior During Therapy: Flat affect Overall Cognitive Status: Impaired/Different from baseline Area of Impairment: Following commands;Problem solving;Attention Current Attention Level: Focused Following Commands: Follows one step commands with increased time Awareness: Intellectual Problem Solving: Slow processing;Difficulty sequencing;Requires verbal cues;Requires tactile cues;Decreased initiation    Balance  End of Session PT - End of Session Equipment Utilized During Treatment: Gait belt Activity Tolerance: Patient limited by fatigue Patient left: in chair;with family/visitor present;with nursing/sitter in  room;with call bell/phone within reach Nurse Communication: Mobility status;Need for lift equipment   GP     Ernestina Columbia, SPTA 07/03/2013, 10:49 AM

## 2013-07-03 NOTE — Progress Notes (Signed)
Patient ID: ABRAHAN FULMORE, male   DOB: October 01, 1927, 79 y.o.   MRN: [id number]   LOS: 8 days  POD#1 -- PEG  Subjective: Nonverbal   Objective: Vital signs in last 24 hours: Temp:  [96.7 F (35.9 C)-98.7 F (37.1 C)] 98.4 F (36.9 C) (12/04 0600) Pulse Rate:  [36-65] 60 (12/04 0600) Resp:  [16-22] 18 (12/04 0600) BP: (151-196)/(56-125) 158/69 mmHg (12/04 0600) SpO2:  [95 %-100 %] 95 % (12/04 0600) Weight:  [182 lb (82.555 kg)] 182 lb (82.555 kg) (12/04 0600) Last BM Date: 07/01/13   Physical Exam General appearance: no distress Resp: clear to auscultation bilaterally Cardio: regular rate and rhythm GI: Soft, +BS, PEG site WNL   Assessment/Plan: Dysphagia -- Everything as expected POD#1 from PEG, TF restarted. Surgery will sign off, please call with questions.    Freeman Caldron, PA-C Pager: [phone] General Trauma PA Pager: [phone]   07/03/2013

## 2013-07-03 NOTE — Progress Notes (Signed)
Stroke Team Progress Note  HISTORY Eric Oneill is an 79 y.o. male with a history of hypertension, hyperlipidemia, coronary artery disease, and Parkinson's disease, presenting with new onset speech difficulty and right-sided weakness. Onset was 8:30 PM. There is no previous history of stroke nor TIA. Patient has been taking aspirin 81 mg per day. CT scan of his head showed left basal ganglia acute intraparenchymal hemorrhage. There was no ventricular extension. NIH stroke score was 12.   LSN: 8:30 PM on 06/25/2013  tPA Given: No: ICH  MRankin: 3    Patient was not a TPA candidate secondary to ICH.   SUBJECTIVE Got PEG yesterday. Doing well. No neurological worsening. Plans are for SNF. Appears more awake and engaged into communicating today.  OBJECTIVE Most recent Vital Signs: Filed Vitals:   07/02/13 1510 07/02/13 2143 07/03/13 0138 07/03/13 0600  BP: 171/66 164/85 151/71 158/69  Pulse: 65 59 56 60  Temp:  98.7 F (37.1 C) 98.5 F (36.9 C) 98.4 F (36.9 C)  TempSrc:  Oral Oral Oral  Resp: 19 20 18 18   Height:      Weight:    82.555 kg (182 lb)  SpO2: 98% 97% 95% 95%   CBG (last 3)   Recent Labs  07/02/13 2333 07/03/13 0400 07/03/13 0813  GLUCAP 99 83 101*    IV Fluid Intake:   . sodium chloride 20 mL/hr at 06/30/13 2253  . feeding supplement (JEVITY 1.2 CAL) 1,000 mL (07/01/13 0501)    MEDICATIONS  . antiseptic oral rinse  15 mL Mouth Rinse q12n4p  . carbidopa-levodopa  1.5 tablet Per NG tube Q6H  . chlorhexidine  15 mL Mouth Rinse BID  . free water  300 mL Per Tube Q6H  . insulin aspart  0-9 Units Subcutaneous Q4H  . pantoprazole (PROTONIX) IV  40 mg Intravenous QHS  . senna-docusate  1 tablet Oral BID   PRN:  acetaminophen, acetaminophen, hydrALAZINE, labetalol  Diet:  NPO--restarted tube feeds, POD #1 PEG Activity:  Activity as tolerated DVT Prophylaxis:  SCD  CLINICALLY SIGNIFICANT STUDIES Basic Metabolic Panel:   Recent Labs Lab 06/28/13 0431  06/29/13 0455  NA 133* 133*  K 3.8 3.5  CL 98 102  CO2 19 21  GLUCOSE 110* 125*  BUN 24* 39*  CREATININE 0.83 0.83  CALCIUM 9.7 9.4   Liver Function Tests:   Recent Labs Lab 06/27/13 0340  AST 29  ALT 16  ALKPHOS 47  BILITOT 1.4*  PROT 6.2  ALBUMIN 3.6   CBC:   Recent Labs Lab 06/27/13 0340 06/29/13 0455  WBC 9.8 9.3  NEUTROABS 6.8  --   HGB 14.3 13.9  HCT 41.7 40.3  MCV 89.1 87.0  PLT 197 201   Coagulation:  No results found for this basename: LABPROT, INR,  in the last 168 hours Cardiac Enzymes:  No results found for this basename: CKTOTAL, CKMB, CKMBINDEX, TROPONINI,  in the last 168 hours Urinalysis:   Recent Labs Lab 06/28/13 0904  COLORURINE YELLOW  LABSPEC 1.022  PHURINE 5.5  GLUCOSEU NEGATIVE  HGBUR SMALL*  BILIRUBINUR NEGATIVE  KETONESUR >80*  PROTEINUR 30*  UROBILINOGEN 0.2  NITRITE NEGATIVE  LEUKOCYTESUR NEGATIVE   Lipid Panel No results found for this basename: chol,  trig,  hdl,  cholhdl,  vldl,  ldlcalc   HgbA1C  No results found for this basename: HGBA1C    Urine Drug Screen:     Component Value Date/Time   LABOPIA NONE DETECTED 06/26/2013 0101  COCAINSCRNUR NONE DETECTED 06/26/2013 0101   LABBENZ NONE DETECTED 06/26/2013 0101   AMPHETMU NONE DETECTED 06/26/2013 0101   THCU NONE DETECTED 06/26/2013 0101   LABBARB NONE DETECTED 06/26/2013 0101    Alcohol Level:  No results found for this basename: ETH,  in the last 168 hours  Ct Head Wo Contrast 06/30/2013    No significant change in appearance of left basal ganglia hemorrhage and associated mass effect with similar degree of midline shift to the right.   Dg Chest Port 1 View 06/28/2013   CLINICAL DATA:  Fever.  EXAM: PORTABLE CHEST - 1 VIEW  COMPARISON:  09/03/2009.  FINDINGS: Prior CABG. Cardiomegaly. Normal pulmonary vascularity. Lungs clear. No pleural effusion pneumothorax. No acute osseous abnormality.  IMPRESSION: Prior CABG. Stable cardiomegaly. No CHF. No acute  cardiopulmonary disease.   Electronically Signed   By: Maisie Fus  Register   On: 06/28/2013 11:11   Dg Abd Portable 1v 06/28/2013   CLINICAL DATA:  Evaluate feeding tube placement.  EXAM: PORTABLE ABDOMEN - 1 VIEW  COMPARISON:  Chest radiograph 06/28/2013  FINDINGS: There is feeding tube tip in the right upper abdomen. The tip is probably in the region of the duodenum bulb. Calcifications in the aorta. Degenerative changes in the lower lumbar spine. Nonspecific bowel gas pattern.  IMPRESSION: Feeding tube tip is in the region of the proximal duodenum.   Electronically Signed   By: Richarda Overlie M.D.   On: 06/28/2013 12:40    CT of the brain   06/27/2013 1. Left basal ganglia hematoma with 3 mm left-to-right midline  shift. Critical Value/emergent results were called by telephone at  the time of interpretation on 06/25/2013 at 9:45 PM to Dr.Stewart,  who verbally acknowledged these results.  2. Atrophy and chronic microvascular white matter ischemic changes.  CT head  06/27/13 No significant interval change in size of left basal ganglia intraparenchymal hemorrhage with stable 6 mm of left-to-right midline shift. No hydrocephalus. No new hemorrhage.  06/30/2013 No significant change in appearance of left basal ganglia hemorrhage and associated mass effect with similar degree of midline shift to the right. Please see above.  MRI of the brain    MRA of the brain    2D Echocardiogram    Carotid Doppler    CXR    EKG   Sinus rhythm Multiform ventricular premature complexes Prolonged PR interval Right bundle branch block  Therapy Recommendations CIR  Physical Exam  General: The patient is sleepy, but can be aroused.  Respiratory: Lung fields are clear.  Cardiovascular: There is a regular rate and rhythm, no obvious murmurs or rubs are noted.  Abdomen: Soft nontender, positive bowel sounds.  Skin: No significant peripheral edema is noted.   Neurologic Exam  Mental status: The  patient is arousable, will follow commands.  Cranial nerves: Facial symmetry is not resent, with depression of the right nasolabial fold. Gaze preference is to the left, but the patient is able to deviate the eyes to the right. The patient will blink to threat from the left, minimal blink on the right. The patient is minimally verbal, will occasionally attempt to whisper some words. Severely dysarthric and dysphonic.  Motor: The patient has a dense right hemiparesis, arm greater than leg. The patient moves the left side.  Sensory examination: The patient has some response to deep pain stimulation on all fours.  Coordination: There does not appear to be dysmetria with the use of the left arm and leg, weakness with the right  side. Resting tremors noted on the left arm.  Gait and station: The gait cannot be tested.  Reflexes: Deep tendon reflexes are symmetric, slightly depressed. Bilateral Babinski's noted, more prominent on the right.    ASSESSMENT Mr. Eric Oneill is a 79 y.o. male presenting with left BG ICH, hypertensive in nature . No intraventricular extension. Patient was on baby aspirin 81mg  daily prior to admission. There is a pre-existing history of Parkinson's disease and coronary artery disease. The patient has a right hemiparesis, left gaze preference. Some worsening of mental status overnight. The patient is not a TPA candidate secondary to hemorrhage.   Parkinson's disease  Accelerated hypertension, blood pressure had decreased, BP meds stopped, stable.  Left BG ICH  Dyslipidemia  Coronary artery disease  Vasogenic/cytotoxic, mild edema  Protein calorie malnutrition, s/p PEG  On 07/02/2013  Hospital day # 8  BP meds discontinued after sinemet was started, as BP has dropped significantly.  TREATMENT/PLAN  Hold aspirin/antithrombtics due to hemorrhage  If PEG remains stable as starting to use feeds, plans are to discharge to SNF when bed available.  Continue  to work with therapy  Remains NPO---> PEG 07/02/2013, now cleared to start using site for Jevity feeds this am per surgical team.  Follow up with Dr. Pearlean Brownie in stroke clinic in 2 months.  Follow up with primary MD in 2 weeks for hospital follow up.  Gwendolyn Lima. Manson Passey, [facility], MBA, MHA [facility] Pager: [phone] 07/03/2013 8:37 AM  I have personally obtained a history, examined the patient, evaluated imaging results, and formulated the assessment and plan of care. I agree with the above. Delia Heady, MD

## 2013-07-03 NOTE — Progress Notes (Signed)
NUTRITION FOLLOW UP  Intervention:    Continue to advance Jevity 1.2 formula via PEG tube by 10 ml every 4 hours to goal rate of 70 ml/hr to provide 2016 kcals, 93 gm protein, 1361 ml free water daily RD to follow for nutrition care plan  Nutrition Dx:   Inadequate oral intake related to moderate-severe dysphagia as evidenced by NPO status, ongoing  Goal:   EN to meet > 90% of estimated nutrition needs, met  Monitor:   EN regimen & tolerance, weight, labs, I/O's  Assessment:   Patient with PMH of HTN, HLD, CAD and Parkinson's disease, presenting with new onset speech difficulty and right-sided weakness. CT scan of his head showed left basal ganglia acute intraparenchymal hemorrhage.   Patient transferred from 44M-Neuro ICU to 4N-Neuroscience 12/1.  Patient s/p MBSS 12/2.  SLP recommending continued NPO status with long-term nutrition support.  Patient s/p procedure 12/3: PERCUTANEOUS ENDOSCOPIC GASTROSTOMY PLACEMENT  Jevity 1.2 formula currently infusing at 40 ml/hr; advancing to previous goal rate of 70 ml/hr to provide 2016 kcals, 93 gm protein, 1361 ml free water daily.  Free water flushes at 300 ml every 6 hours.  Disposition: SNF.  Height: Ht Readings from Last 1 Encounters:  06/26/13 5\' 9"  (1.753 m)    Weight Status:   Wt Readings from Last 1 Encounters:  07/03/13 182 lb (82.555 kg)    Re-estimated needs:  Kcal: 1800-2000 Protein: 90-105 gm Fluid: 1.8-2.0 L  Skin: Intact  Diet Order: NPO   Intake/Output Summary (Last 24 hours) at 07/03/13 0844 Last data filed at 07/03/13 0600  Gross per 24 hour  Intake    430 ml  Output   1550 ml  Net  -1120 ml    Labs:   Recent Labs Lab 06/27/13 0340 06/28/13 0431 06/29/13 0455  NA 132* 133* 133*  K 3.8 3.8 3.5  CL 100 98 102  CO2 22 19 21   BUN 20 24* 39*  CREATININE 0.90 0.83 0.83  CALCIUM 9.3 9.7 9.4  GLUCOSE 85 110* 125*    CBG (last 3)   Recent Labs  07/03/13 0400 07/03/13 0813 07/03/13 0827   GLUCAP 83 101* 93    Scheduled Meds: . antiseptic oral rinse  15 mL Mouth Rinse q12n4p  . carbidopa-levodopa  1.5 tablet Per NG tube Q6H  . chlorhexidine  15 mL Mouth Rinse BID  . free water  300 mL Per Tube Q6H  . insulin aspart  0-9 Units Subcutaneous Q4H  . pantoprazole (PROTONIX) IV  40 mg Intravenous QHS  . senna-docusate  1 tablet Oral BID    Continuous Infusions: . sodium chloride 20 mL/hr at 06/30/13 2253  . feeding supplement (JEVITY 1.2 CAL) 1,000 mL (07/01/13 0501)    Maureen Chatters, RD, LDN Pager #: [phone] After-Hours Pager #: [phone]

## 2013-07-03 NOTE — Progress Notes (Signed)
Residual at this time is 15 cc.Will continue to monitor.

## 2013-07-03 NOTE — Progress Notes (Signed)
Clinical Social Work Department CLINICAL SOCIAL WORK PLACEMENT NOTE 07/03/2013  Patient:  Eric Oneill, Eric Oneill  Account Number:  [id number] Admit date:  06/25/2013  Clinical Social Worker:  Leron Croak, CLINICAL SOCIAL WORKER  Date/time:  07/03/2013 09:52 AM  Clinical Social Work is seeking post-discharge placement for this patient at the following level of care:   SKILLED NURSING   (*CSW will update this form in Epic as items are completed)   07/03/2013  Patient/family provided with [facility] Department of Clinical Social Work's list of facilities offering this level of care within the geographic area requested by the patient (or if unable, by the patient's family).  07/03/2013  Patient/family informed of their freedom to choose among providers that offer the needed level of care, that participate in Medicare, Medicaid or managed care program needed by the patient, have an available bed and are willing to accept the patient.  07/03/2013  Patient/family informed of [facility]' ownership interest in [facility], as well as of the fact that they are under no obligation to receive care at this facility.  PASARR submitted to EDS on 07/01/2013 PASARR number received from EDS on 07/01/2013  FL2 transmitted to all facilities in geographic area requested by pt/family on  07/03/2013 FL2 transmitted to all facilities within larger geographic area on 07/03/2013  Patient informed that his/her managed care company has contracts with or will negotiate with  certain facilities, including the following:     Patient/family informed of bed offers received:   Patient chooses bed at  Physician recommends and patient chooses bed at    Patient to be transferred to  on   Patient to be transferred to facility by   The following physician request were entered in Epic:   Additional Comments:   Lenord Carbo  [facility]  4N 1-16;  6N1-16 Phone: [phone]

## 2013-07-03 NOTE — Progress Notes (Signed)
Speech Language Pathology Treatment: Dysphagia;Cognitive-Linquistic  Patient Details Name: Eric Oneill MRN: [id number] DOB: [birth year] Today's Date: 07/03/2013 Time: 4098-1191 SLP Time Calculation (min): 24 min  Assessment / Plan / Recommendation Clinical Impression  Pt making good progress, more alert, following basic commands with 80% accuracy though weakness still severe and responses are delayed. Pt able to approximate bilabial sounds with moderate cues, increased volume. Completed effortful swallows x2 over 4 trials with minimal throat clearing.    HPI HPI: 79 yo male adm to [facility] with speech deficits and right sided weakness.  Pt found to have a left BG hemorrhage.  PMH + for Parkinson's disease, CAD, depression, dysphagia.  Pt for BSE as she did not pass a RN stroke swallow screen.    Pertinent Vitals NA  SLP Plan  Continue with current plan of care    Recommendations Diet recommendations: NPO              Oral Care Recommendations: Oral care Q4 per protocol Follow up Recommendations: Inpatient Rehab Plan: Continue with current plan of care    GO    [facility], MA CCC-SLP [phone]  Claudine Mouton 07/03/2013, 4:30 PM

## 2013-07-03 NOTE — Progress Notes (Signed)
Clinical Social Work Department BRIEF PSYCHOSOCIAL ASSESSMENT 07/03/2013  Patient:  Eric Oneill, Eric Oneill     Account Number:  [id number]     Admit date:  06/25/2013  Clinical Social Worker:  Leron Croak, CLINICAL SOCIAL WORKER  Date/Time:  07/03/2013 09:33 AM  Referred by:  Physician  Date Referred:  07/02/2013 Referred for  SNF Placement   Other Referral:   Interview type:  Family Other interview type:   CSW spoke with the daughter via phone.    Eric Oneill   [phone]    PSYCHOSOCIAL DATA Living Status:  ALONE Admitted from facility:   Level of care:   Primary support name:  Eric Oneill [phone] Primary support relationship to patient:  CHILD, ADULT Degree of support available:   Pt has good support from family.    CURRENT CONCERNS Current Concerns  Post-Acute Placement   Other Concerns:    SOCIAL WORK ASSESSMENT / PLAN CSW spoke with the Pts daughter Eric Oneill) via phone as the Pt is only oriented to self and not able to participate in the assessment. CSW introduced self and reason for call. Pts daughter is aware that Pt will need placement and is agreeable to faxing Pt information to [facility]. Pts daughter stated that CSW can speak with any of the daughters for decision making and that CSW can leave a copy of SNF offers in the room for her to look over.   Assessment/plan status:  Information/Referral to [facility] Other assessment/ plan:   Information/referral to community resources:   CSW provided listing of local facilities in [facility] and will leave SNF offers in the room as well.  `    PATIENT'S/FAMILY'S RESPONSE TO PLAN OF CARE: Pts daughter was appreciative for assistance with SNF placement and keeping her informed.       Leron Croak [facility]  4N 1-16;  [phone] Phone: [phone]

## 2013-07-04 LAB — GLUCOSE, CAPILLARY
Glucose-Capillary: 112 mg/dL — ABNORMAL HIGH (ref 70–99)
Glucose-Capillary: 121 mg/dL — ABNORMAL HIGH (ref 70–99)

## 2013-07-04 MED ORDER — CARBIDOPA-LEVODOPA 25-100 MG PO TABS: 1.5000 | ORAL_TABLET | Freq: Every day | ORAL | Status: DC

## 2013-07-04 MED ORDER — PANTOPRAZOLE SODIUM 40 MG PO TBEC
40.0000 mg | DELAYED_RELEASE_TABLET | Freq: Every day | ORAL | Status: DC
  Administered 2013-07-04: 40 mg via ORAL

## 2013-07-04 NOTE — Progress Notes (Signed)
Pt transferred out via [facility] at 1900 in stable condition. Dauhhter Catha Nottingham made aware of transfer and to meet pt at [facility].

## 2013-07-04 NOTE — Progress Notes (Signed)
Patient tolerating tube feeding well at stated goal. Pt has not had another coughing episode since beginning of shift. Family educated on keeping [facility] greater than 30 degrees. Pt lung sounds remain clear bilaterally, and remains afebrile. Will continue to monitor. Messer, Ivin Booty RN

## 2013-07-04 NOTE — Progress Notes (Signed)
Pt to be d/c today to [facility].   Pt and family agreeable. Confirmed plans with facility.  Plan transfer via EMS.    Walfred Bettendorf LCSWA  [facility]  4N 1-16;  6N1-16 Phone: [phone]    

## 2013-07-04 NOTE — Progress Notes (Addendum)
Stroke Team Progress Note  HISTORY Eric Oneill is an 79 y.o. male with a history of hypertension, hyperlipidemia, coronary artery disease, and Parkinson's disease, presenting with new onset speech difficulty and right-sided weakness. Onset was 8:30 PM. There is no previous history of stroke nor TIA. Patient has been taking aspirin 81 mg per day. CT scan of his head showed left basal ganglia acute intraparenchymal hemorrhage. There was no ventricular extension. NIH stroke score was 12.   LSN: 8:30 PM on 06/25/2013  tPA Given: No: ICH  MRankin: 3    Patient was not a TPA candidate secondary to ICH.   SUBJECTIVE Dense right hemiplegia. PEG working well. No progressive symptoms. For discharge to SNF today.  OBJECTIVE Most recent Vital Signs: Filed Vitals:   07/04/13 0134 07/04/13 0431 07/04/13 0513 07/04/13 0958  BP: 142/55  121/50 159/55  Pulse: 69  58 60  Temp: 97.9 F (36.6 C)  98 F (36.7 C) 98 F (36.7 C)  TempSrc: Oral  Oral Axillary  Resp: 18  18 18   Height:      Weight:  82.691 kg (182 lb 4.8 oz)    SpO2: 98%  97% 97%   CBG (last 3)   Recent Labs  07/04/13 0004 07/04/13 0403 07/04/13 0800  GLUCAP 123* 101* 112*    IV Fluid Intake:   . sodium chloride Stopped (07/03/13 1834)  . feeding supplement (JEVITY 1.2 CAL) 1,000 mL (07/01/13 0501)    MEDICATIONS  . antiseptic oral rinse  15 mL Mouth Rinse q12n4p  . carbidopa-levodopa  1.5 tablet Per NG tube Q6H  . chlorhexidine  15 mL Mouth Rinse BID  . free water  300 mL Per Tube Q6H  . insulin aspart  0-9 Units Subcutaneous Q4H  . pantoprazole  40 mg Oral Q0600  . senna-docusate  1 tablet Oral BID   PRN:  acetaminophen, acetaminophen, hydrALAZINE, labetalol  Diet:  NPO PEG Activity:  Activity as tolerated DVT Prophylaxis:  SCD  CLINICALLY SIGNIFICANT STUDIES Basic Metabolic Panel:   Recent Labs Lab 06/28/13 0431 06/29/13 0455  NA 133* 133*  K 3.8 3.5  CL 98 102  CO2 19 21  GLUCOSE 110* 125*   BUN 24* 39*  CREATININE 0.83 0.83  CALCIUM 9.7 9.4   Liver Function Tests:  No results found for this basename: AST, ALT, ALKPHOS, BILITOT, PROT, ALBUMIN,  in the last 168 hours CBC:   Recent Labs Lab 06/29/13 0455  WBC 9.3  HGB 13.9  HCT 40.3  MCV 87.0  PLT 201   Coagulation:  No results found for this basename: LABPROT, INR,  in the last 168 hours Cardiac Enzymes:  No results found for this basename: CKTOTAL, CKMB, CKMBINDEX, TROPONINI,  in the last 168 hours Urinalysis:   Recent Labs Lab 06/28/13 0904  COLORURINE YELLOW  LABSPEC 1.022  PHURINE 5.5  GLUCOSEU NEGATIVE  HGBUR SMALL*  BILIRUBINUR NEGATIVE  KETONESUR >80*  PROTEINUR 30*  UROBILINOGEN 0.2  NITRITE NEGATIVE  LEUKOCYTESUR NEGATIVE   Lipid Panel No results found for this basename: chol,  trig,  hdl,  cholhdl,  vldl,  ldlcalc   HgbA1C  No results found for this basename: HGBA1C    Urine Drug Screen:     Component Value Date/Time   LABOPIA NONE DETECTED 06/26/2013 0101   COCAINSCRNUR NONE DETECTED 06/26/2013 0101   LABBENZ NONE DETECTED 06/26/2013 0101   AMPHETMU NONE DETECTED 06/26/2013 0101   THCU NONE DETECTED 06/26/2013 0101   LABBARB NONE DETECTED 06/26/2013  0101    Alcohol Level:  No results found for this basename: ETH,  in the last 168 hours  Ct Head Wo Contrast 06/30/2013    No significant change in appearance of left basal ganglia hemorrhage and associated mass effect with similar degree of midline shift to the right.   Dg Chest Port 1 View 06/28/2013   CLINICAL DATA:  Fever.  EXAM: PORTABLE CHEST - 1 VIEW  COMPARISON:  09/03/2009.  FINDINGS: Prior CABG. Cardiomegaly. Normal pulmonary vascularity. Lungs clear. No pleural effusion pneumothorax. No acute osseous abnormality.  IMPRESSION: Prior CABG. Stable cardiomegaly. No CHF. No acute cardiopulmonary disease.   Electronically Signed   By: Maisie Fus  Register   On: 06/28/2013 11:11   Dg Abd Portable 1v 06/28/2013   CLINICAL DATA:   Evaluate feeding tube placement.  EXAM: PORTABLE ABDOMEN - 1 VIEW  COMPARISON:  Chest radiograph 06/28/2013  FINDINGS: There is feeding tube tip in the right upper abdomen. The tip is probably in the region of the duodenum bulb. Calcifications in the aorta. Degenerative changes in the lower lumbar spine. Nonspecific bowel gas pattern.  IMPRESSION: Feeding tube tip is in the region of the proximal duodenum.   Electronically Signed   By: Richarda Overlie M.D.   On: 06/28/2013 12:40    CT of the brain   06/27/2013 1. Left basal ganglia hematoma with 3 mm left-to-right midline  shift. Critical Value/emergent results were called by telephone at  the time of interpretation on 06/25/2013 at 9:45 PM to Dr.Stewart,  who verbally acknowledged these results.  2. Atrophy and chronic microvascular white matter ischemic changes.  CT head  06/27/13 No significant interval change in size of left basal ganglia intraparenchymal hemorrhage with stable 6 mm of left-to-right midline shift. No hydrocephalus. No new hemorrhage.  06/30/2013 No significant change in appearance of left basal ganglia hemorrhage and associated mass effect with similar degree of midline shift to the right. Please see above.  MRI of the brain    MRA of the brain    2D Echocardiogram    Carotid Doppler    CXR    EKG   Sinus rhythm Multiform ventricular premature complexes Prolonged PR interval Right bundle branch block  Therapy Recommendations CIR  Physical Exam  General: The patient is sleepy, but can be aroused.  Respiratory: Lung fields are clear.  Cardiovascular: There is a regular rate and rhythm, no obvious murmurs or rubs are noted.  Abdomen: Soft nontender, positive bowel sounds.  Skin: No significant peripheral edema is noted.   Neurologic Exam  Mental status: The patient is arousable, will follow commands.  Cranial nerves: Facial symmetry is not resent, with depression of the right nasolabial fold. Gaze  preference is to the left, but the patient is able to deviate the eyes to the right. The patient will blink to threat from the left, minimal blink on the right. The patient is minimally verbal, will occasionally attempt to whisper some words. Severely dysarthric and dysphonic.  Motor: The patient has a dense right hemiparesis, arm greater than leg. The patient moves the left side.  Sensory examination: The patient has some response to deep pain stimulation on all fours.  Coordination: There does not appear to be dysmetria with the use of the left arm and leg, weakness with the right side. Resting tremors noted on the left arm.  Gait and station: The gait cannot be tested.  Reflexes: Deep tendon reflexes are symmetric, slightly depressed. Bilateral Babinski's noted, more prominent on  the right.    ASSESSMENT Eric Oneill is a 79 y.o. male presenting with left BG ICH, hypertensive in nature . No intraventricular extension. Patient was on baby aspirin 81mg  daily prior to admission. There is a pre-existing history of Parkinson's disease and coronary artery disease. The patient has a right hemiparesis, left gaze preference. Some worsening of mental status overnight. The patient is not a TPA candidate secondary to hemorrhage.   Parkinson's disease  Accelerated hypertension, blood pressure had decreased, BP meds stopped, stable.  Left BG ICH  Dyslipidemia  Coronary artery disease  Calorie malnutrition: tube feedings started.  Vasogenic/cytotoxic, mild edema  Hospital day # 9  BP meds discontinued after sinemet was started, as BP had dropped significantly.  TREATMENT/PLAN  Hold aspirin due to hemorrhage  Transfer to SNF today: bed available.  Continue to work with therapy  Remains NPO--->PEG done, POD#2  Patient remains hemodynamically stable for discharge.  Gwendolyn Lima. Manson Passey, [facility], MBA, MHA [facility] Pager: [phone] 07/04/2013 11:36 AM  I have  personally obtained a history, examined the patient, evaluated imaging results, and formulated the assessment and plan of care. I agree with the above. Delia Heady, MD  ADDENDUM REGARDING ABOVE JEVITY.  This is a continuous infusion. The nutritionist has the following recommendations that we are following: Jevity 1.2 formula of continous rate of 70 ml/hr to provide 2016 kcals, 93 gm protein, 1361 ml free water daily. Free water flushes at 300 ml every 6 hours. This Jevity is a continuous infusion at  55ml/hr. Disposition: SNF.  The protonix is switched to oral formulation.  Gwendolyn Lima. Manson Passey, [facility], MBA, MHA [facility] Pager: [phone] 07/04/2013 11:36 AM  Delia Heady, MD

## 2013-07-04 NOTE — Progress Notes (Signed)
CSW spoke with [facility] this a.m. And facility will be able to accept Pt today. CSW will update all documentation and prepare Pt for transport.   CSW will continue to follow Pt for d/c planning.    Leron Croak [facility]  [address];  [phone] Phone: [phone]

## 2013-07-04 NOTE — Progress Notes (Signed)
Report given to [facility],( LPN) at the facility. Assessments remain unchanged now. Awaiting on MD to do medication reconciliation before patient is discharged.

## 2013-07-04 NOTE — Progress Notes (Signed)
CSW spoke with Pt nurse concerning d/c planning. CSW relayed that Pt can still go to facility.   Pt is to d/c to facility today.  Pt packet is in the draw next to Pt room. Pt is clear to come once the d/c summary medications are rectified. Facility admissions [facility]) stated that Pt can come anytime this evening after d/c summary is received and there are no further concerns about medication. CSW relayed this information to nurse and provided fax number at facility for faxing d/c summary this afternoon.   Family are aware of d/c.   Pt will transfer by PTAR and number has been provided on Packet.  Leron Croak [facility]  [address];  [phone] Phone: [phone]

## 2013-07-04 NOTE — Progress Notes (Signed)
Medication reconciliation done and AVS printed. Faxed to [facility] at [phone] provided by Child psychotherapist. Tube feeding stopped; PEG flushed. Awaiting PTAR to transfer patient out. No family member at bedside at this moment for Stroke education.

## 2013-07-04 NOTE — Progress Notes (Signed)
Orthopedic Tech Progress Note Patient Details:  Eric Oneill 1927/08/06 [id number]  Patient ID: Eric Oneill, male   DOB: [birth year], 79 y.o.   MRN: [id number]   Eric Oneill 07/04/2013, 9:23 AMReplacement abdominal binder.

## 2013-07-22 ENCOUNTER — Telehealth: Payer: Self-pay | Admitting: Neurology

## 2013-07-22 NOTE — Telephone Encounter (Signed)
Pt canc follow up sch for 09/22/13. He did r/s at this time. Pt has an upcoming at [facility] / [facility]

## 2013-08-04 ENCOUNTER — Ambulatory Visit: Payer: PRIVATE HEALTH INSURANCE | Admitting: Physical Therapy

## 2013-08-04 ENCOUNTER — Ambulatory Visit: Payer: PRIVATE HEALTH INSURANCE

## 2013-08-04 ENCOUNTER — Ambulatory Visit: Payer: PRIVATE HEALTH INSURANCE | Admitting: Occupational Therapy

## 2013-09-02 ENCOUNTER — Ambulatory Visit: Payer: Self-pay | Admitting: Neurology

## 2013-09-03 ENCOUNTER — Encounter: Payer: Self-pay | Admitting: Neurology

## 2013-09-03 ENCOUNTER — Ambulatory Visit (INDEPENDENT_AMBULATORY_CARE_PROVIDER_SITE_OTHER): Payer: Medicare Other | Admitting: Neurology

## 2013-09-03 VITALS — BP 134/84 | HR 76 | Temp 97.6°F | Resp 16

## 2013-09-03 DIAGNOSIS — G20 Parkinson's disease: Secondary | ICD-10-CM

## 2013-09-03 DIAGNOSIS — I619 Nontraumatic intracerebral hemorrhage, unspecified: Secondary | ICD-10-CM

## 2013-09-03 DIAGNOSIS — I69922 Dysarthria following unspecified cerebrovascular disease: Secondary | ICD-10-CM

## 2013-09-03 DIAGNOSIS — IMO0002 Reserved for concepts with insufficient information to code with codable children: Secondary | ICD-10-CM

## 2013-09-03 DIAGNOSIS — R1310 Dysphagia, unspecified: Secondary | ICD-10-CM

## 2013-09-03 NOTE — Patient Instructions (Signed)
1. Change Carbidopa-Levodopa 25/100 to 2 tablets at 7:00 am, 1.5 tablets at 11:00 am, 2 tablets at 3:00 pm, 1.5 tablets at 7:00 pm 2. Add Sinemet CR 50/200 1 tablet at bedtime.  3. Follow up in 3-4 weeks.

## 2013-09-03 NOTE — Progress Notes (Signed)
Eric Oneill was seen today in the movement disorders clinic for neurologic consultation at the request of [name], MD.  The consultation is for the evaluation of Parkinson's disease.  The patient previously saw Dr. [name].  I last visit with Dr. [name] was in June, 2013.  It appears that the patient was seen by Dr. [name] on a yearly basis.  I have a copy of the June, 2013 note from Dr. [name] and I did review this.  The pt supplies his own hx.    The first symptom(s) the patient noticed was tremor in the L hand.  He was dx with PD in 2008.    He was not initally placed on medication, but once he started on medication, it was carbidopa/levodopa.  He was initially on the 50/200 CR but that has been changed and he is now on the 25/100 IR three times per day before meals.  He is on selegeline once per day and does not know if it helps.  It appears from notes that he was on it twice per day in the past.    04/07/13 update:  This patient is accompanied in the office by his child who supplements the history.  Last visit, I increased the patient's carbidopa/levodopa 25/100-1-1/2 tablets 3 times per day.  We also added carbidopa/levodopa 50/200 CR at bedtime.  He does think that the increase helped his overall symptoms, but notices that he seems to have more trouble, especially with tremor, between 12 PM and 4 PM.  He generally takes his medication at 7 AM, noon and 5 PM.  He has not fallen.  There are no hallucinations.  His daughter prepares his pill box and then he takes the medications.  About 5 days ago, he decided to try and stop the lisinopril as he did not think he needed it any longer.  He has been watching his blood pressure closely and it has remained good.  He has been having some cramping at night.  Some of the cramping is in the feet and some inappropriate longus muscle on the right.  He has had some difficulty falling asleep at night, but his daughter also reports that the patient takes two, 2  hour naps.  He also decreased his selegiline so he was only taking it one time per day, at 7 AM.  He continues to complain of back pain.  Since our last visit the patient did have an MRI of the brain.  There was dilated Virchow-Robins spaces in the right basal ganglia, but I think that there is potentially an old lacune in the left basal ganglia.  An MRI of the lumbar spine demonstrated moderate central canal stenosis at the L4-L5 level and bilateral neural foraminal stenosis at the L5-S1 level.  06/09/13 update:  The patient is accompanied by his daughter who supplements the history.  The patient has Parkinson's disease.  He is currently on carbidopa/levodopa 25/100, 1-1/2 tablets at 7 AM/11 AM/3 PM/6 PM.  This is an increase of one additional dose since our last visit.  He is also on carbidopa/levodopa 50/200 at bedtime.  He increased his selegeline back to bid.  He overall feel good but continues to complain of dizziness.  Last visit, he had self discontinued the lisinopril, but he went back on it.  He had one fall since our last visit, that occurred in the yard while pulling weeds.  He did not get hurt.  He has not had any nausea or vomiting  or hallucinations.  09/03/13 update:  The patient is accompanied by his daughter who supplements the history.  I reviewed his hospital records since our last visit.  He does have a history of Parkinson's disease and is supposed to be on carbidopa/levodopa 25/100, 1-1/2 tablets at 7 AM/11 AM/3 PM/7 PM and on carbidopa/levodopa 50/200 at bedtime.  However, after several calls to the ECF, it appears that the patient is getting carbidopa/levodopa 25/102 tablets 4 times per day (no times marked on the sheet and it is not marked the patient was given the medication) and the carbidopa/levodopa 50/200 at bedtime has been discontinued for unknown reason.  Since our last visit, the patient was hospitalized.  This was on 06/25/2013.  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  I reviewed those films. He saw Dr. Tilden Dome and Dr. Jannifer Franklin in the hospital.  I reviewed those notes.  He had a PEG tube placed.  He was minimally verbal at discharge.  He is now at a SNF and is making a remarkable recovery.  He is only using PEG at night for supplemental feeds and has advanced to a mechanical soft diet.  His speech is improving as well.  His daughter expresses frustration as he was getting therapy 5 days per week and now his insurance is only allowing 3 days per week.  They are paying out of pocket to have it the other days.  The pt is now making some attempts at walking in the parallel bars.     PREVIOUS MEDICATIONS:  Azilect (no help); carbidopa/levodopa extended release in the past but was discontinued in favor of the IR; selegiline  ALLERGIES:   Allergies  Allergen Reactions  . Ivp Dye [Iodinated Diagnostic Agents] Other (See Comments)    Dizziness     CURRENT MEDICATIONS:    Medication List       This list is accurate as of: 09/03/13 10:05 AM.  Always use your most recent med list.               carbidopa-levodopa 25-100 MG per tablet  Commonly known as:  SINEMET IR  Take 1.5 tablets by mouth 5 (five) times daily.     escitalopram 10 MG tablet  Commonly known as:  LEXAPRO  Take 1 tablet (10 mg total) by mouth daily.     feeding supplement (JEVITY 1.2 CAL) Liqd  Place 1,000 mLs into feeding tube continuous.     simvastatin 40 MG tablet  Commonly known as:  ZOCOR  Take 40 mg by mouth at bedtime.        PAST MEDICAL HISTORY:   Past Medical History  Diagnosis Date  . Coronary artery disease   . Parkinson disease     dx: 06/2007  . Depression   . Stroke     05-2013    PAST SURGICAL HISTORY:   Past Surgical History  Procedure Laterality Date  . Coronary artery bypass graft    . Hernia repair      x2  . Circumcision  Aug 2002  . Carpal tunnel release      L  . Knee surgery      bilateral   . Peg placement N/A 07/02/2013    Procedure: PERCUTANEOUS ENDOSCOPIC GASTROSTOMY (PEG) PLACEMENT;  Surgeon: Gwenyth Ober, MD;  Location: [place];  Service: General;  Laterality: N/A;    SOCIAL HISTORY:   History   Social History  . Marital Status:  Widowed    Spouse Name: N/A    Number of Children: N/A  . Years of Education: N/A   Occupational History  . retired     [employer]   Social History Main Topics  . Smoking status: Former Smoker    Quit date: 02/18/1958  . Smokeless tobacco: Never Used  . Alcohol Use: Yes     Comment: daily, glass wine daily  . Drug Use: No  . Sexual Activity: Not on file   Other Topics Concern  . Not on file   Social History Narrative  . No narrative on file    FAMILY HISTORY:   Family Status  Relation Status Death Age  . Mother Deceased 25    heart ds  . Father Deceased 20    CVA  . Brother      2, multiple myeloma; heart disease  . Sister      3, CAD, Breast CA    ROS: Admits to chronic low back pain.   A complete 10 system review of systems was obtained and was unremarkable apart from what is mentioned above.  PHYSICAL EXAMINATION:    VITALS:   Filed Vitals:   09/03/13 1002  BP: 134/84  Pulse: 76  Temp: 97.6 F (36.4 C)  Resp: 16    GEN:  The patient appears stated age and is in NAD. HEENT:  Normocephalic, atraumatic.  The mucous membranes are very dry. The superficial temporal arteries are without ropiness or tenderness. CV:  RRR Lungs:  CTAB Neck/HEME:  There are no carotid bruits bilaterally.  Neurological examination:  Orientation: The patient is alert and oriented x3. Fund of knowledge is appropriate.  Recent and remote memory are intact.  Attention and concentration are normal.    Able to name objects and repeat phrases. Cranial nerves: There is good facial symmetry.  There is significant facial hypomimia.  Pupils are equal round and reactive to light bilaterally. Fundoscopic exam is attempted but the disc  margins are not well visualized bilaterally.  Extraocular muscles are intact.  There is vertical, upbeat nystagmus.  The visual fields are full to confrontational testing. The speech is dysarthric but he was able to communicate in a meaningful, understandable way.   His speech is fluent. He is hypophonic. Soft palate rises symmetrically and there is no tongue deviation. Hearing is intact to conversational tone. Sensation: Sensation is intact to light and pinprick throughout (facial, trunk, extremities). Vibration is intact at the bilateral big toe. There is no extinction with double simultaneous stimulation. There is no sensory dermatomal level identified. Motor: Strength is 5/5 on the L but on the R there is minimal shoulder flexion but otherwise no movement in the right upper extremity.  I was not able to heel and in the right lower extremity, even in the quadriceps, although his daughter states that she has seen him move some.    Movement examination: Tone: There is  mild to moderate increased tone in the left upper and left lower extremity.  On the right, there is loss of tone in the right upper and lower extremity. Abnormal movements: There is an intermittent left upper extremity resting tremor Coordination:  There is definite decremation with RAM's, of the left.  This could not be tested on the right.     ASSESSMENT/PLAN:  1.  Parkinsonism.  I agree that this does represent idiopathic Parkinson's disease.  The patient has tremor, bradykinesia, rigidity and postural instability.  -I will change his levodopa  dosing, so that he gets at specific times.  He will take carbidopa/levodopa 25/100, 2 tablets at 7 AM, 1.5 tablets at 11 AM, 2 tablets at 3 PM, 1.5 tablets at 7 PM and we will once again add back carbidopa/levodopa 50/200 at bedtime.   2.  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  -He is doing really well with therapy.  I will write a letter to his insurance  supporting his continued therapy. 3.  I will see him back in the next 3-4 weeks, sooner should new neurologic issues arise.  50% of this visit spent in counseling and care.  I spent a significant amount of time on the phone with nursing home.  Face-to-face visit was 45 mins.

## 2013-09-17 ENCOUNTER — Ambulatory Visit: Payer: Self-pay | Admitting: Nurse Practitioner

## 2013-09-18 NOTE — Telephone Encounter (Signed)
Pt has cancelled apt several times, pt is going back to original neurologist. Closing encounter

## 2013-09-22 ENCOUNTER — Ambulatory Visit (INDEPENDENT_AMBULATORY_CARE_PROVIDER_SITE_OTHER): Payer: Medicare Other | Admitting: Neurology

## 2013-09-22 ENCOUNTER — Ambulatory Visit: Payer: Medicare Other | Admitting: Interventional Cardiology

## 2013-09-22 ENCOUNTER — Encounter: Payer: Self-pay | Admitting: Neurology

## 2013-09-22 ENCOUNTER — Ambulatory Visit: Payer: Medicare Other | Admitting: Neurology

## 2013-09-22 VITALS — BP 132/66 | HR 80 | Resp 18

## 2013-09-22 DIAGNOSIS — G20 Parkinson's disease: Secondary | ICD-10-CM

## 2013-09-22 DIAGNOSIS — I69959 Hemiplegia and hemiparesis following unspecified cerebrovascular disease affecting unspecified side: Secondary | ICD-10-CM

## 2013-09-22 DIAGNOSIS — I69351 Hemiplegia and hemiparesis following cerebral infarction affecting right dominant side: Secondary | ICD-10-CM

## 2013-09-22 NOTE — Progress Notes (Signed)
Eric Oneill was seen today in the movement disorders clinic for neurologic consultation at the request of [name], MD.  The consultation is for the evaluation of Parkinson's disease.  The patient previously saw Dr. [name].  I last visit with Dr. [name] was in June, 2013.  It appears that the patient was seen by Dr. [name] on a yearly basis.  I have a copy of the June, 2013 note from Dr. [name] and I did review this.  The pt supplies his own hx.    The first symptom(s) the patient noticed was tremor in the L hand.  He was dx with PD in 2008.    He was not initally placed on medication, but once he started on medication, it was carbidopa/levodopa.  He was initially on the 50/200 CR but that has been changed and he is now on the 25/100 IR three times per day before meals.  He is on selegeline once per day and does not know if it helps.  It appears from notes that he was on it twice per day in the past.    04/07/13 update:  This patient is accompanied in the office by his child who supplements the history.  Last visit, I increased the patient's carbidopa/levodopa 25/100-1-1/2 tablets 3 times per day.  We also added carbidopa/levodopa 50/200 CR at bedtime.  He does think that the increase helped his overall symptoms, but notices that he seems to have more trouble, especially with tremor, between 12 PM and 4 PM.  He generally takes his medication at 7 AM, noon and 5 PM.  He has not fallen.  There are no hallucinations.  His daughter prepares his pill box and then he takes the medications.  About 5 days ago, he decided to try and stop the lisinopril as he did not think he needed it any longer.  He has been watching his blood pressure closely and it has remained good.  He has been having some cramping at night.  Some of the cramping is in the feet and some inappropriate longus muscle on the right.  He has had some difficulty falling asleep at night, but his daughter also reports that the patient takes two, 2  hour naps.  He also decreased his selegiline so he was only taking it one time per day, at 7 AM.  He continues to complain of back pain.  Since our last visit the patient did have an MRI of the brain.  There was dilated Virchow-Robins spaces in the right basal ganglia, but I think that there is potentially an old lacune in the left basal ganglia.  An MRI of the lumbar spine demonstrated moderate central canal stenosis at the L4-L5 level and bilateral neural foraminal stenosis at the L5-S1 level.  06/09/13 update:  The patient is accompanied by his daughter who supplements the history.  The patient has Parkinson's disease.  He is currently on carbidopa/levodopa 25/100, 1-1/2 tablets at 7 AM/11 AM/3 PM/6 PM.  This is an increase of one additional dose since our last visit.  He is also on carbidopa/levodopa 50/200 at bedtime.  He increased his selegeline back to bid.  He overall feel good but continues to complain of dizziness.  Last visit, he had self discontinued the lisinopril, but he went back on it.  He had one fall since our last visit, that occurred in the yard while pulling weeds.  He did not get hurt.  He has not had any nausea or vomiting  or hallucinations.  09/03/13 update:  The patient is accompanied by his daughter who supplements the history.  I reviewed his hospital records since our last visit.  He does have a history of Parkinson's disease and is supposed to be on carbidopa/levodopa 25/100, 1-1/2 tablets at 7 AM/11 AM/3 PM/7 PM and on carbidopa/levodopa 50/200 at bedtime.  However, after several calls to the [facility], it appears that the patient is getting carbidopa/levodopa 25/100, 2 tablets 4 times per day (no times marked on the sheet and it is not marked the patient was given the medication) and the carbidopa/levodopa 50/200 at bedtime has been discontinued for unknown reason.  Since our last visit, the patient was hospitalized.  This was on 06/25/2013.  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  I reviewed those films. He saw Dr. Tilden Dome and Dr. Jannifer Franklin in the hospital.  I reviewed those notes.  He had a PEG tube placed.  He was minimally verbal at discharge.  He is now at a SNF and is making a remarkable recovery.  He is only using PEG at night for supplemental feeds and has advanced to a mechanical soft diet.  His speech is improving as well.  His daughter expresses frustration as he was getting therapy 5 days per week and now his insurance is only allowing 3 days per week.  They are paying out of pocket to have it the other days.  The pt is now making some attempts at walking in the parallel bars.    09/22/13 update:  The pt is accompanied by his daughters who supplement the hx.  Pt has PD with a fairly recent left BG hemmorhage, resulting in the R hemiparesis.  He is on carbidopa/levodopa 25/100, 2 at 7am, 1.5 at 11 am, 2 at 3 pm, 1.5 at 7 pm.  He is on an additional carbidopa/levodopa CR 50/200 at night.  The pt definitely noted a positive difference when we adjusted the medication.  He is continuing with therapy, although they are paying for some of the therapy out of pocket.  They are not sure when he will be released from therapy but think that it will be a while.  No swallowing issues.  Facial paresthesias on the R.  Some food pocketing on the R.     PREVIOUS MEDICATIONS:  Azilect (no help); carbidopa/levodopa extended release in the past but was discontinued in favor of the IR; selegiline  ALLERGIES:   Allergies  Allergen Reactions  . Ivp Dye [Iodinated Diagnostic Agents] Other (See Comments)    Dizziness     CURRENT MEDICATIONS:    Medication List       This list is accurate as of: 09/22/13  3:42 PM.  Always use your most recent med list.               carbidopa-levodopa 25-100 MG per tablet  Commonly known as:  SINEMET IR  Take 1.5 tablets by mouth 4 (four) times daily. Take 2 tablets by mouth at 7 am, 1.5 tablets by  mouth at 11 am, 2 tablets by mouth at 3 pm, 1.5 tablets by mouth at 7 pm     carbidopa-levodopa 50-200 MG per tablet  Commonly known as:  SINEMET CR  Take 1 tablet by mouth at bedtime.     docusate sodium 100 MG capsule  Commonly known as:  COLACE  Take 100 mg by mouth daily.     escitalopram 10 MG  tablet  Commonly known as:  LEXAPRO  Take 1 tablet (10 mg total) by mouth daily.     feeding supplement (JEVITY 1.2 CAL) Liqd  Place 1,000 mLs into feeding tube continuous.     omeprazole 10 MG capsule  Commonly known as:  PRILOSEC  20 mg by PEG Tube route daily.     polyethylene glycol packet  Commonly known as:  MIRALAX / GLYCOLAX  Take 17 g by mouth daily.     simvastatin 40 MG tablet  Commonly known as:  ZOCOR  Take 40 mg by mouth at bedtime.        PAST MEDICAL HISTORY:   Past Medical History  Diagnosis Date  . Coronary artery disease   . Parkinson disease     dx: 06/2007  . Depression   . Stroke     05-2013    PAST SURGICAL HISTORY:   Past Surgical History  Procedure Laterality Date  . Coronary artery bypass graft    . Hernia repair      x2  . Circumcision  Aug 2002  . Carpal tunnel release      L  . Knee surgery      bilateral  . Peg placement N/A 07/02/2013    Procedure: PERCUTANEOUS ENDOSCOPIC GASTROSTOMY (PEG) PLACEMENT;  Surgeon: Gwenyth Ober, MD;  Location: [facility];  Service: General;  Laterality: N/A;    SOCIAL HISTORY:   History   Social History  . Marital Status: Widowed    Spouse Name: N/A    Number of Children: N/A  . Years of Education: N/A   Occupational History  . retired     [employer]   Social History Main Topics  . Smoking status: Former Smoker    Quit date: 02/18/1958  . Smokeless tobacco: Never Used  . Alcohol Use: Yes     Comment: daily, glass wine daily  . Drug Use: No  . Sexual Activity: Not on file   Other Topics Concern  . Not on file   Social History Narrative  . No narrative on file    FAMILY HISTORY:    Family Status  Relation Status Death Age  . Mother Deceased 51    heart ds  . Father Deceased 43    CVA  . Brother      2, multiple myeloma; heart disease  . Sister      3, CAD, Breast CA    ROS: Admits to chronic low back pain.   A complete 10 system review of systems was obtained and was unremarkable apart from what is mentioned above.  PHYSICAL EXAMINATION:    VITALS:   Filed Vitals:   09/22/13 1453  BP: 132/66  Pulse: 80  Resp: 18    GEN:  The patient appears stated age and is in NAD. HEENT:  Normocephalic, atraumatic.  The mucous membranes are very dry. The superficial temporal arteries are without ropiness or tenderness. CV:  RRR Lungs:  CTAB Neck/HEME:  There are no carotid bruits bilaterally.  Neurological examination:  Orientation: The patient is alert and oriented x3. Fund of knowledge is appropriate.   Cranial nerves: There is good facial symmetry.  There is significant facial hypomimia.  Pupils are equal round and reactive to light bilaterally. Fundoscopic exam is attempted but the disc margins are not well visualized bilaterally.  Extraocular muscles are intact.  There is vertical, upbeat nystagmus.  The visual fields are full to confrontational testing. The speech is dysarthric but  he was able to communicate in a meaningful, understandable way.   He is hypophonic. Soft palate rises symmetrically and there is no tongue deviation. Hearing is intact to conversational tone. Sensation: Sensation is intact to light and pinprick throughout (facial, trunk, extremities). Vibration is intact at the bilateral big toe. There is no extinction with double simultaneous stimulation. There is no sensory dermatomal level identified. Motor: Strength is 5/5 on the L but on the R there is minimal shoulder flexion but otherwise no movement in the right upper extremity.  I was able to feel quad contraction today on the R.   Movement examination: Tone: There is normal tone on the L  today.  On the right, there is loss of tone in the right upper and lower extremity. Abnormal movements: There is an intermittent left upper extremity resting tremor Coordination:  There is mild decremation with RAM's, of the left.  This could not be tested on the right.     ASSESSMENT/PLAN:  1.  Parkinsonism.  I agree that this does represent idiopathic Parkinson's disease.  The patient has tremor, bradykinesia, rigidity and postural instability.  -He is doing better with carbidopa/levodopa 25/100, 2 tablets at 7 AM, 1.5 tablets at 11 AM, 2 tablets at 3 PM, 1.5 tablets at 7 PM and  carbidopa/levodopa 50/200 at bedtime.   2.  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  -He is doing really well with therapy.  I am not sure that he will get full recovery but I do think that he will continue to progress. 3.  I will see him back in the next 2-3 months, sooner should new neurologic issues arise.

## 2013-10-06 ENCOUNTER — Telehealth: Payer: Self-pay | Admitting: Neurology

## 2013-10-06 ENCOUNTER — Telehealth: Payer: Self-pay | Admitting: *Deleted

## 2013-10-06 NOTE — Telephone Encounter (Signed)
Dr. Lowell GuitarPowell calling asking to speak to Dr. Pearlean BrownieSethi.  Sent message [phone]

## 2013-10-06 NOTE — Telephone Encounter (Signed)
Received note from after hours nursing staff that daughter called to let us know about her father having a DVT. I spoke with Dr Tat, who did not order the Doppler. I called daughter and she states that patient did not get taken to the ER. He is being evaluated by the attending MD at [facility] today. She states he may be transferred to the hospital. They wanted to keep us informed due to patient's cerebral bleed in November 2014. She will let us know if they need anything from our office. Made aware that blood thinner management would come from Cardiology.

## 2013-10-06 NOTE — Telephone Encounter (Signed)
I called and Dr. Lowell GuitarPowell had left the office. I paged him to my cell phone and he did not come back

## 2013-10-06 NOTE — Telephone Encounter (Signed)
Given his history, would prefer a [place] filter over anticoagulation

## 2013-11-25 ENCOUNTER — Telehealth (INDEPENDENT_AMBULATORY_CARE_PROVIDER_SITE_OTHER): Payer: Self-pay

## 2013-11-25 NOTE — Telephone Encounter (Signed)
Pt's daughter calling to see about the feeding tube that Dr Lindie SpruceWyatt placed in her father last year b/c he is doing so much better now with eating food by mouth. [facility] and the nurses asked for her to call us to check and see how the feeding tube was placed by DR Lindie SpruceWyatt to make sure there are no stitches. I advised her that I would check with Dr Lindie SpruceWyatt now b/c he was in the office today seeing pt's. I printed the pt's op report to ask Dr Lindie SpruceWyatt about the feeding tube being removed at the pt's facility as long as the tube was not sutured down inside the stomach. Per Dr Lindie SpruceWyatt there are no sutures with the feeding tube it should be ok being removed at pt's facility with no cutting involved. The daughter understands.

## 2013-12-29 ENCOUNTER — Ambulatory Visit: Payer: Medicare Other | Admitting: Neurology

## 2014-01-05 ENCOUNTER — Ambulatory Visit: Payer: Medicare Other | Admitting: Neurology

## 2014-01-12 ENCOUNTER — Encounter: Payer: Self-pay | Admitting: Neurology

## 2014-01-12 ENCOUNTER — Ambulatory Visit (INDEPENDENT_AMBULATORY_CARE_PROVIDER_SITE_OTHER): Payer: Medicare Other | Admitting: Neurology

## 2014-01-12 VITALS — BP 120/88 | HR 68 | Resp 18

## 2014-01-12 DIAGNOSIS — G20 Parkinson's disease: Secondary | ICD-10-CM

## 2014-01-12 DIAGNOSIS — I69351 Hemiplegia and hemiparesis following cerebral infarction affecting right dominant side: Secondary | ICD-10-CM

## 2014-01-12 DIAGNOSIS — I619 Nontraumatic intracerebral hemorrhage, unspecified: Secondary | ICD-10-CM

## 2014-01-12 DIAGNOSIS — I69959 Hemiplegia and hemiparesis following unspecified cerebrovascular disease affecting unspecified side: Secondary | ICD-10-CM

## 2014-01-12 NOTE — Patient Instructions (Signed)
1. Increase Carbidopa Levodopa 25/100 to 2 tablets at 7am, 11am, 3pm, and 7 pm. 2. Do not give Carbidopa Levodopa with protein.  3. Continue Carbidopa Levodoap 50/200 at bedtime.

## 2014-01-12 NOTE — Progress Notes (Signed)
Eric Oneill was seen today in the movement disorders clinic for neurologic consultation at the request of [name], MD.  The consultation is for the evaluation of Parkinson's disease.  The patient previously saw Dr. [name].  I last visit with Dr. [name] was in June, 2013.  It appears that the patient was seen by Dr. [name] on a yearly basis.  I have a copy of the June, 2013 note from Dr. [name] and I did review this.  The pt supplies his own hx.    The first symptom(s) the patient noticed was tremor in the L hand.  He was dx with PD in 2008.    He was not initally placed on medication, but once he started on medication, it was carbidopa/levodopa.  He was initially on the 50/200 CR but that has been changed and he is now on the 25/100 IR three times per day before meals.  He is on selegeline once per day and does not know if it helps.  It appears from notes that he was on it twice per day in the past.    04/07/13 update:  This patient is accompanied in the office by his child who supplements the history.  Last visit, I increased the patient's carbidopa/levodopa 25/100-1-1/2 tablets 3 times per day.  We also added carbidopa/levodopa 50/200 CR at bedtime.  He does think that the increase helped his overall symptoms, but notices that he seems to have more trouble, especially with tremor, between 12 PM and 4 PM.  He generally takes his medication at 7 AM, noon and 5 PM.  He has not fallen.  There are no hallucinations.  His daughter prepares his pill box and then he takes the medications.  About 5 days ago, he decided to try and stop the lisinopril as he did not think he needed it any longer.  He has been watching his blood pressure closely and it has remained good.  He has been having some cramping at night.  Some of the cramping is in the feet and some inappropriate longus muscle on the right.  He has had some difficulty falling asleep at night, but his daughter also reports that the patient takes two, 2  hour naps.  He also decreased his selegiline so he was only taking it one time per day, at 7 AM.  He continues to complain of back pain.  Since our last visit the patient did have an MRI of the brain.  There was dilated Virchow-Robins spaces in the right basal ganglia, but I think that there is potentially an old lacune in the left basal ganglia.  An MRI of the lumbar spine demonstrated moderate central canal stenosis at the L4-L5 level and bilateral neural foraminal stenosis at the L5-S1 level.  06/09/13 update:  The patient is accompanied by his daughter who supplements the history.  The patient has Parkinson's disease.  He is currently on carbidopa/levodopa 25/100, 1-1/2 tablets at 7 AM/11 AM/3 PM/6 PM.  This is an increase of one additional dose since our last visit.  He is also on carbidopa/levodopa 50/200 at bedtime.  He increased his selegeline back to bid.  He overall feel good but continues to complain of dizziness.  Last visit, he had self discontinued the lisinopril, but he went back on it.  He had one fall since our last visit, that occurred in the yard while pulling weeds.  He did not get hurt.  He has not had any nausea or vomiting  or hallucinations.  09/03/13 update:  The patient is accompanied by his daughter who supplements the history.  I reviewed his hospital records since our last visit.  He does have a history of Parkinson's disease and is supposed to be on carbidopa/levodopa 25/100, 1-1/2 tablets at 7 AM/11 AM/3 PM/7 PM and on carbidopa/levodopa 50/200 at bedtime.  However, after several calls to the [facility], it appears that the patient is getting carbidopa/levodopa 25/100, 2 tablets 4 times per day (no times marked on the sheet and it is not marked the patient was given the medication) and the carbidopa/levodopa 50/200 at bedtime has been discontinued for unknown reason.  Since our last visit, the patient was hospitalized.  This was on 06/25/2013.  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  I reviewed those films. He saw Dr. Tilden Dome and Dr. Jannifer Franklin in the hospital.  I reviewed those notes.  He had a PEG tube placed.  He was minimally verbal at discharge.  He is now at a SNF and is making a remarkable recovery.  He is only using PEG at night for supplemental feeds and has advanced to a mechanical soft diet.  His speech is improving as well.  His daughter expresses frustration as he was getting therapy 5 days per week and now his insurance is only allowing 3 days per week.  They are paying out of pocket to have it the other days.  The pt is now making some attempts at walking in the parallel bars.    09/22/13 update:  The pt is accompanied by his daughters who supplement the hx.  Pt has PD with a fairly recent left BG hemmorhage, resulting in the R hemiparesis.  He is on carbidopa/levodopa 25/100, 2 at 7am, 1.5 at 11 am, 2 at 3 pm, 1.5 at 7 pm.  He is on an additional carbidopa/levodopa CR 50/200 at night.  The pt definitely noted a positive difference when we adjusted the medication.  He is continuing with therapy, although they are paying for some of the therapy out of pocket.  They are not sure when he will be released from therapy but think that it will be a while.  No swallowing issues.  Facial paresthesias on the R.  Some food pocketing on the R.    01/12/14 update:  The pt is accompanied by his daughter who supplement the hx.  Pt has PD with a fairly recent left BG hemmorhage, resulting in the R hemiparesis.  He is on carbidopa/levodopa 25/100, 2 at 7am, 1.5 at 11 am, 2 at 3 pm, 1.5 at 7 pm.  He is on an additional carbidopa/levodopa CR 50/200 at night.   Pt had DVT since last visit and I thought that a [place] filter was placed but apparently the [facility] physician talked with neurologist in [place] and after CT brain was done and didn't show evidence of brain bleed, he was placed on eloquis.  Doesn't walk so no fall risk.  Some tremor in the  L hand over the last month.  This is bothering him especially when picking up his drink.  Meds given in applesauce but he doesn't bite them.  Looking into getting a motorized scooter but has a VF cut so ECF helping him with that issue before issuing him one.  Some recent dizziness that they don't think related to BP change.  Mood good on Lexapro and they would like me to take over prescribing that.  PREVIOUS MEDICATIONS:  Azilect (no help); carbidopa/levodopa extended release in the past but was discontinued in favor of the IR; selegiline  ALLERGIES:   Allergies  Allergen Reactions  . Ivp Dye [Iodinated Diagnostic Agents] Other (See Comments)    Dizziness     CURRENT MEDICATIONS:    Medication List       This list is accurate as of: 01/12/14 10:00 AM.  Always use your most recent med list.               carbidopa-levodopa 25-100 MG per tablet  Commonly known as:  SINEMET IR  Take 1.5 tablets by mouth 4 (four) times daily. Take 2 tablets by mouth at 7 am, 1.5 tablets by mouth at 11 am, 2 tablets by mouth at 3 pm, 1.5 tablets by mouth at 7 pm     carbidopa-levodopa 50-200 MG per tablet  Commonly known as:  SINEMET CR  Take 1 tablet by mouth at bedtime.     docusate sodium 100 MG capsule  Commonly known as:  COLACE  Take 100 mg by mouth daily.     ELIQUIS 5 MG Tabs tablet  Generic drug:  apixaban  Take 5 mg by mouth 2 (two) times daily.     escitalopram 10 MG tablet  Commonly known as:  LEXAPRO  Take 1 tablet (10 mg total) by mouth daily.     omeprazole 10 MG capsule  Commonly known as:  PRILOSEC  20 mg by PEG Tube route daily.     polyethylene glycol packet  Commonly known as:  MIRALAX / GLYCOLAX  Take 17 g by mouth daily.     simvastatin 40 MG tablet  Commonly known as:  ZOCOR  Take 40 mg by mouth at bedtime.        PAST MEDICAL HISTORY:   Past Medical History  Diagnosis Date  . Coronary artery disease   . Parkinson disease     dx: 06/2007  .  Depression   . Stroke     05-2013    PAST SURGICAL HISTORY:   Past Surgical History  Procedure Laterality Date  . Coronary artery bypass graft    . Hernia repair      x2  . Circumcision  Aug 2002  . Carpal tunnel release      L  . Knee surgery      bilateral  . Peg placement N/A 07/02/2013    Procedure: PERCUTANEOUS ENDOSCOPIC GASTROSTOMY (PEG) PLACEMENT;  Surgeon: Gwenyth Ober, MD;  Location: [facility];  Service: General;  Laterality: N/A;    SOCIAL HISTORY:   History   Social History  . Marital Status: Widowed    Spouse Name: N/A    Number of Children: N/A  . Years of Education: N/A   Occupational History  . retired     [employer]   Social History Main Topics  . Smoking status: Former Smoker    Quit date: 02/18/1958  . Smokeless tobacco: Never Used  . Alcohol Use: Yes     Comment: daily, glass wine daily  . Drug Use: No  . Sexual Activity: Not on file   Other Topics Concern  . Not on file   Social History Narrative  . No narrative on file    FAMILY HISTORY:   Family Status  Relation Status Death Age  . Mother Deceased 61    heart ds  . Father Deceased 82    CVA  . Brother  2, multiple myeloma; heart disease  . Sister      3, CAD, Breast CA    ROS:   A complete 10 system review of systems was obtained and was unremarkable apart from what is mentioned above.  PHYSICAL EXAMINATION:    VITALS:   Filed Vitals:   01/12/14 0943  BP: 120/88  Pulse: 68  Resp: 18    GEN:  The patient appears stated age and is in NAD. HEENT:  Normocephalic, atraumatic.  The mucous membranes are very dry. The superficial temporal arteries are without ropiness or tenderness. CV:  RRR Lungs:  CTAB Neck/HEME:  There are no carotid bruits bilaterally.  Neurological examination:  Orientation: The patient is alert and oriented x3. Fund of knowledge is appropriate.   Cranial nerves: There is R facial droop.    There is vertical, upbeat nystagmus.  The visual  fields are full to confrontational testing. The speech is dysarthric but he was able to communicate in a meaningful, understandable way.   Soft palate rises symmetrically and there is no tongue deviation. Hearing is intact to conversational tone. Motor: Strength is 5/5 on the L but on the R there is 0/5 movement in the UE/LE.   Movement examination: Tone: There is normal tone on the L today.  On the right, there is loss of tone in the right upper and lower extremity. Abnormal movements: There is an intermittent left upper extremity tremor but more noted when he held the arm antigravity.   Coordination:  There is mild decremation with RAM's, of the left.  This could not be tested on the right.     ASSESSMENT/PLAN:  1.  Parkinsonism.  I agree that this does represent idiopathic Parkinson's disease.  The patient has tremor, bradykinesia, rigidity and postural instability.  -We will slightly increase the  carbidopa/levodopa 25/100, 2 tablets at 7 AM, 2 tablets at 11 AM, 2 tablets at 3 PM, 2 tablets at 7 PM and  carbidopa/levodopa 50/200 at bedtime.  He will watch for increased dizziness.   Watch protein consumption with levodopa. 2.  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  -Worried about the addition of eloquis for recent DVT.  Would have preferred filter and talked with pt/daughter about risks today.  At this point, will continue the eloquis.  Doesn't walk.   3.  Depression  -doing well with lexapro and will continue 4.  I will see him back in the next 3-4 months, sooner should new neurologic issues arise.

## 2014-01-26 ENCOUNTER — Telehealth: Payer: Self-pay | Admitting: Neurology

## 2014-01-26 NOTE — Telephone Encounter (Signed)
Are we sure its the increase as he was c/o dizziness prior to the increase?  I just barely increased it but if they want we can go back to the 2/1.5/2/1.5 and try that but I am betting dizziness is unchanged.  Have they monitored BP with dizziness?

## 2014-01-26 NOTE — Telephone Encounter (Signed)
Increase in med has caused dizziness. Would like to consider decreasing dose. Please call [phone] / Sherri S.

## 2014-01-26 NOTE — Telephone Encounter (Signed)
Spoke with daughter and she states dizziness is worse than before. They were instructed they could go back to previous dosage of Carbidopa Levodopa. They are made aware that BP needs monitoring and is probably a major culprit to the dizziness. An order was written for previous dosage and faxed to [facility] to attn: Nigel MormonHelen Smith at [phone].

## 2014-01-26 NOTE — Telephone Encounter (Signed)
Increased Carbidopa Levodopa 25/100 to 2 tablets at 7am, 11am, 3pm, and 7 pm last visit. Please advise.

## 2014-05-18 ENCOUNTER — Encounter: Payer: Self-pay | Admitting: Neurology

## 2014-05-18 ENCOUNTER — Ambulatory Visit (INDEPENDENT_AMBULATORY_CARE_PROVIDER_SITE_OTHER): Payer: Medicare Other | Admitting: Neurology

## 2014-05-18 VITALS — BP 104/70 | HR 60

## 2014-05-18 DIAGNOSIS — G20 Parkinson's disease: Secondary | ICD-10-CM

## 2014-05-18 DIAGNOSIS — Z5181 Encounter for therapeutic drug level monitoring: Secondary | ICD-10-CM

## 2014-05-18 DIAGNOSIS — I610 Nontraumatic intracerebral hemorrhage in hemisphere, subcortical: Secondary | ICD-10-CM

## 2014-05-18 DIAGNOSIS — I6523 Occlusion and stenosis of bilateral carotid arteries: Secondary | ICD-10-CM

## 2014-05-18 DIAGNOSIS — E785 Hyperlipidemia, unspecified: Secondary | ICD-10-CM

## 2014-05-18 DIAGNOSIS — I10 Essential (primary) hypertension: Secondary | ICD-10-CM

## 2014-05-18 DIAGNOSIS — I619 Nontraumatic intracerebral hemorrhage, unspecified: Secondary | ICD-10-CM

## 2014-05-18 LAB — COMPREHENSIVE METABOLIC PANEL
ALT: <8 U/L (ref 0–53)
AST: 19 U/L (ref 0–37)
Albumin: 4.1 g/dL (ref 3.5–5.2)
Alkaline Phosphatase: 79 U/L (ref 39–117)
BUN: 16 mg/dL (ref 6–23)
CHLORIDE: 100 meq/L (ref 96–112)
CO2: 28 meq/L (ref 19–32)
CREATININE: 0.87 mg/dL (ref 0.50–1.35)
Calcium: 10.1 mg/dL (ref 8.4–10.5)
GLUCOSE: 82 mg/dL (ref 70–99)
Potassium: 4.3 mEq/L (ref 3.5–5.3)
Sodium: 137 mEq/L (ref 135–145)
Total Bilirubin: 0.9 mg/dL (ref 0.2–1.2)
Total Protein: 6.9 g/dL (ref 6.0–8.3)

## 2014-05-18 LAB — CBC WITH DIFFERENTIAL/PLATELET
Basophils Absolute: 0.1 10*3/uL (ref 0.0–0.1)
Basophils Relative: 1 % (ref 0–1)
Eosinophils Absolute: 0.1 10*3/uL (ref 0.0–0.7)
Eosinophils Relative: 2 % (ref 0–5)
HEMATOCRIT: 43.6 % (ref 39.0–52.0)
Hemoglobin: 14.9 g/dL (ref 13.0–17.0)
LYMPHS ABS: 1.7 10*3/uL (ref 0.7–4.0)
LYMPHS PCT: 29 % (ref 12–46)
MCH: 30.4 pg (ref 26.0–34.0)
MCHC: 34.2 g/dL (ref 30.0–36.0)
MCV: 89 fL (ref 78.0–100.0)
Monocytes Absolute: 0.5 10*3/uL (ref 0.1–1.0)
Monocytes Relative: 9 % (ref 3–12)
Neutro Abs: 3.4 10*3/uL (ref 1.7–7.7)
Neutrophils Relative %: 59 % (ref 43–77)
Platelets: 241 10*3/uL (ref 150–400)
RBC: 4.9 MIL/uL (ref 4.22–5.81)
RDW: 13.2 % (ref 11.5–15.5)
WBC: 5.8 10*3/uL (ref 4.0–10.5)

## 2014-05-18 LAB — LIPID PANEL
Cholesterol: 127 mg/dL (ref 0–200)
HDL: 42 mg/dL (ref >39)
LDL Cholesterol: 56 mg/dL (ref 0–99)
Total CHOL/HDL Ratio: 3 Ratio
Triglycerides: 145 mg/dL (ref <150)
VLDL: 29 mg/dL (ref 0–40)

## 2014-05-18 LAB — VITAMIN B12: VITAMIN B 12: 570 pg/mL (ref 211–911)

## 2014-05-18 MED ORDER — ENTACAPONE 200 MG PO TABS: 200.0000 mg | ORAL_TABLET | Freq: Three times a day (TID) | ORAL | Status: DC

## 2014-05-18 MED ORDER — CETIRIZINE HCL 10 MG PO TABS: 10.0000 mg | ORAL_TABLET | Freq: Every day | ORAL | Status: AC

## 2014-05-18 NOTE — Progress Notes (Signed)
Eric Oneill was seen today in the movement disorders clinic for neurologic consultation at the request of Willodean Rosenthal, MD.  The consultation is for the evaluation of Parkinson's disease.  The patient previously saw Dr. [name].  I last visit with Dr. [name] was in June, 2013.  It appears that the patient was seen by Dr. [name] on a yearly basis.  I have a copy of the June, 2013 note from Dr. [name] and I did review this.  The pt supplies his own hx.    The first symptom(s) the patient noticed was tremor in the L hand.  He was dx with PD in 2008.    He was not initally placed on medication, but once he started on medication, it was carbidopa/levodopa.  He was initially on the 50/200 CR but that has been changed and he is now on the 25/100 IR three times per day before meals.  He is on selegeline once per day and does not know if it helps.  It appears from notes that he was on it twice per day in the past.    04/07/13 update:  This patient is accompanied in the office by his child who supplements the history.  Last visit, I increased the patient's carbidopa/levodopa 25/100-1-1/2 tablets 3 times per day.  We also added carbidopa/levodopa 50/200 CR at bedtime.  He does think that the increase helped his overall symptoms, but notices that he seems to have more trouble, especially with tremor, between 12 PM and 4 PM.  He generally takes his medication at 7 AM, noon and 5 PM.  He has not fallen.  There are no hallucinations.  His daughter prepares his pill box and then he takes the medications.  About 5 days ago, he decided to try and stop the lisinopril as he did not think he needed it any longer.  He has been watching his blood pressure closely and it has remained good.  He has been having some cramping at night.  Some of the cramping is in the feet and some inappropriate longus muscle on the right.  He has had some difficulty falling asleep at night, but his daughter also reports that the patient takes two, 2 hour naps.  He  also decreased his selegiline so he was only taking it one time per day, at 7 AM.  He continues to complain of back pain.  Since our last visit the patient did have an MRI of the brain.  There was dilated Virchow-Robins spaces in the right basal ganglia, but I think that there is potentially an old lacune in the left basal ganglia.  An MRI of the lumbar spine demonstrated moderate central canal stenosis at the L4-L5 level and bilateral neural foraminal stenosis at the L5-S1 level.  06/09/13 update:  The patient is accompanied by his daughter who supplements the history.  The patient has Parkinson's disease.  He is currently on carbidopa/levodopa 25/100, 1-1/2 tablets at 7 AM/11 AM/3 PM/6 PM.  This is an increase of one additional dose since our last visit.  He is also on carbidopa/levodopa 50/200 at bedtime.  He increased his selegeline back to bid.  He overall feel good but continues to complain of dizziness.  Last visit, he had self discontinued the lisinopril, but he went back on it.  He had one fall since our last visit, that occurred in the yard while pulling weeds.  He did not get hurt.  He has not had any nausea or vomiting  or hallucinations.  09/03/13 update:  The patient is accompanied by his daughter who supplements the history.  I reviewed his hospital records since our last visit.  He does have a history of Parkinson's disease and is supposed to be on carbidopa/levodopa 25/100, 1-1/2 tablets at 7 AM/11 AM/3 PM/7 PM and on carbidopa/levodopa 50/200 at bedtime.  However, after several calls to the [facility], it appears that the patient is getting carbidopa/levodopa 25/100, 2 tablets 4 times per day (no times marked on the sheet and it is not marked the patient was given the medication) and the carbidopa/levodopa 50/200 at bedtime has been discontinued for unknown reason.  Since our last visit, the patient was hospitalized.  This was on 06/25/2013.  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  I reviewed those films. He saw Dr. Tilden Dome and Dr. Jannifer Franklin in the hospital.  I reviewed those notes.  He had a PEG tube placed.  He was minimally verbal at discharge.  He is now at a SNF and is making a remarkable recovery.  He is only using PEG at night for supplemental feeds and has advanced to a mechanical soft diet.  His speech is improving as well.  His daughter expresses frustration as he was getting therapy 5 days per week and now his insurance is only allowing 3 days per week.  They are paying out of pocket to have it the other days.  The pt is now making some attempts at walking in the parallel bars.    09/22/13 update:  The pt is accompanied by his daughters who supplement the hx.  Pt has PD with a fairly recent left BG hemmorhage, resulting in the R hemiparesis.  He is on carbidopa/levodopa 25/100, 2 at 7am, 1.5 at 11 am, 2 at 3 pm, 1.5 at 7 pm.  He is on an additional carbidopa/levodopa CR 50/200 at night.  The pt definitely noted a positive difference when we adjusted the medication.  He is continuing with therapy, although they are paying for some of the therapy out of pocket.  They are not sure when he will be released from therapy but think that it will be a while.  No swallowing issues.  Facial paresthesias on the R.  Some food pocketing on the R.    01/12/14 update:  The pt is accompanied by his daughter who supplement the hx.  Pt has PD with a fairly recent left BG hemmorhage, resulting in the R hemiparesis.  He is on carbidopa/levodopa 25/100, 2 at 7am, 1.5 at 11 am, 2 at 3 pm, 1.5 at 7 pm.  He is on an additional carbidopa/levodopa CR 50/200 at night.   Pt had DVT since last visit and I thought that a [place] filter was placed but apparently the [facility] physician talked with neurologist in [place] and after CT brain was done and didn't show evidence of brain bleed, he was placed on eloquis.  Doesn't walk so no fall risk.  Some tremor in the L  hand over the last month.  This is bothering him especially when picking up his drink.  Meds given in applesauce but he doesn't bite them.  Looking into getting a motorized scooter but has a VF cut so ECF helping him with that issue before issuing him one.  Some recent dizziness that they don't think related to BP change.  Mood good on Lexapro and they would like me to take over prescribing that.  05/18/14 update:  The patient returns today for followup, accompanied by his daughter who supplements the history.  Last visit, we tried to increase his levodopa to 2 tablets 4 times per day, but this caused dizziness and just decreasing the dose barely to 2 in the morning, 1-1/2 at 11 AM, 2 at 3 PM and one and a half at 7 PM took away the dizziness.  He remains on carbidopa/levodopa 50/200 at night.  He is nearly one year out from his basal ganglia hemorrhage that has left him nearly paralyzed on the right side, and he subsequently had a DVT in the right leg.  He is now on Eliquis because of this.  There has been no melena or hematochezia.  The patient is actually returning home this year for Thanksgiving for the day and this will be a big milestone for him.  The patient is complaining about significant phlegm buildup and he does not think that this is coming from saliva, but rather from postnasal drip.  He has tried Flonase without success.  He does complaining about increasing tremor on the left hand side.  He also has had some difficulty with constipation, but now is on both MiraLax and Colace at the extended care facility.   PREVIOUS MEDICATIONS:  Azilect (no help); carbidopa/levodopa extended release in the past but was discontinued in favor of the IR; selegiline  ALLERGIES:   Allergies  Allergen Reactions  . Ivp Dye [Iodinated Diagnostic Agents] Other (See Comments)    Dizziness     CURRENT MEDICATIONS:    Medication List       This list is accurate as of: 05/18/14  9:59 AM.  Always use your  most recent med list.               carbidopa-levodopa 25-100 MG per tablet  Commonly known as:  SINEMET IR  Take 1.5 tablets by mouth 4 (four) times daily. Take 2 tablets by mouth at 7 am, 1.5 tablets by mouth at 11 am, 2 tablets by mouth at 3 pm, 1.5 tablets by mouth at 7 pm     carbidopa-levodopa 50-200 MG per tablet  Commonly known as:  SINEMET CR  Take 1 tablet by mouth at bedtime.     dextromethorphan-guaiFENesin 30-600 MG per 12 hr tablet  Commonly known as:  MUCINEX DM  Take 1 tablet by mouth 2 (two) times daily.     docusate sodium 100 MG capsule  Commonly known as:  COLACE  Take 100 mg by mouth daily.     ELIQUIS 5 MG Tabs tablet  Generic drug:  apixaban  Take 5 mg by mouth 2 (two) times daily.     escitalopram 10 MG tablet  Commonly known as:  LEXAPRO  Take 1 tablet (10 mg total) by mouth daily.     omeprazole 10 MG capsule  Commonly known as:  PRILOSEC  20 mg by PEG Tube route daily.     polyethylene glycol packet  Commonly known as:  MIRALAX / GLYCOLAX  Take 17 g by mouth daily.     simvastatin 40 MG tablet  Commonly known as:  ZOCOR  Take 40 mg by mouth at bedtime.        PAST MEDICAL HISTORY:   Past Medical History  Diagnosis Date  . Coronary artery disease   . Parkinson disease     dx: 06/2007  . Depression   . Stroke     05-2013    PAST SURGICAL HISTORY:  Past Surgical History  Procedure Laterality Date  . Coronary artery bypass graft    . Hernia repair      x2  . Circumcision  Aug 2002  . Carpal tunnel release      L  . Knee surgery      bilateral  . Peg placement N/A 07/02/2013    Procedure: PERCUTANEOUS ENDOSCOPIC GASTROSTOMY (PEG) PLACEMENT;  Surgeon: Gwenyth Ober, MD;  Location: [facility];  Service: General;  Laterality: N/A;    SOCIAL HISTORY:   History   Social History  . Marital Status: Widowed    Spouse Name: N/A    Number of Children: N/A  . Years of Education: N/A   Occupational History  . retired     AT  and T   Social History Main Topics  . Smoking status: Former Smoker    Quit date: 02/18/1958  . Smokeless tobacco: Never Used  . Alcohol Use: Yes     Comment: daily, glass wine daily  . Drug Use: No  . Sexual Activity: Not on file   Other Topics Concern  . Not on file   Social History Narrative  . No narrative on file    FAMILY HISTORY:   Family Status  Relation Status Death Age  . Mother Deceased 50    heart ds  . Father Deceased 62    CVA  . Brother      2, multiple myeloma; heart disease  . Sister      3, CAD, Breast CA    ROS:   A complete 10 system review of systems was obtained and was unremarkable apart from what is mentioned above.  PHYSICAL EXAMINATION:    VITALS:   Filed Vitals:   05/18/14 0951  BP: 104/70  Pulse: 60    GEN:  The patient appears stated age and is in NAD. HEENT:  Normocephalic, atraumatic.  The mucous membranes are very dry. The superficial temporal arteries are without ropiness or tenderness. CV:  RRR Lungs:  CTAB Neck/HEME:  There are no carotid bruits bilaterally.  Neurological examination:  Orientation: The patient is alert and oriented x3. Fund of knowledge is appropriate.   Cranial nerves: There is R facial droop.    There is vertical, upbeat nystagmus.  The visual fields are full to confrontational testing. The speech is dysarthric but he was able to communicate in a meaningful, understandable way.   Soft palate rises symmetrically and there is no tongue deviation. Hearing is intact to conversational tone. Motor: Strength is 5/5 on the L but on the R there is 0/5 movement in the UE/LE with the exception of some shoulder movement on the right.   Movement examination: Tone: There is normal tone on the L today.  On the right, there is loss of tone in the right upper and lower extremity. Abnormal movements: There is an intermittent left upper extremity tremor  Coordination:  There is mild decremation with RAM's, of the left.  This  could not be tested on the right.     ASSESSMENT/PLAN:  1.  Parkinsonism.  I agree that this does represent idiopathic Parkinson's disease.  The patient has tremor, bradykinesia, rigidity and postural instability.  -We will continue with the carbidopa/levodopa 2 tablets at 7 AM/1-1/2 tablets at 11 AM/2 tablets at 3 PM/1-1/2 tablets at 7 PM and  carbidopa/levodopa 50/200 at bedtime.  He did not tolerate it when we tried to increase the dose further, so we will try to add  Comtan 200 mg 3 times a day instead with the first 3 dosages of levodopa.  He will watch for diarrhea.  Risks, benefits, side effects and alternative therapies were discussed.  The opportunity to ask questions was given and they were answered to the best of my ability.  The patient expressed understanding and willingness to follow the outlined treatment protocols. 2. 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  -Worried about the addition of eloquis for recent DVT but risks are understood.    -order carotid u/s as per pt, hx of stenosis and I cannot find where this has been done (apparently out of our system)  -Check fasting chol, chem, B12, cbc today 3.  Depression  -doing well with lexapro and will continue 4.  Allergic rhinitis  -try zyrtec.  Risks, benefits, side effects and alternative therapies were discussed.  The opportunity to ask questions was given and they were answered to the best of my ability.  The patient expressed understanding and willingness to follow the outlined treatment protocols. 5.  Return in about 5 months (around 10/17/2014).  Visit time 40 min with greater than 50% in couseling/coordinating care.  ECF  ppwrk filled out as well.

## 2014-05-18 NOTE — Patient Instructions (Addendum)
1. Your provider has requested that you have labwork completed today. [facility] on the first floor of this building before leaving the office today. 2. We have you scheduled for your Carotid Ultrasound on 05/19/14 at 11:00 am. Please arrive 15 minutes prior and [facility]. If this is not a good date/time please call [phone] to reschedule.  3. Start Comtan 200 mg - take one tablet three times daily with your first three doses of Sinemet. 4. Start Zyrtec 10 mg daily.

## 2014-05-19 ENCOUNTER — Telehealth: Payer: Self-pay | Admitting: Neurology

## 2014-05-19 ENCOUNTER — Ambulatory Visit (HOSPITAL_COMMUNITY): Payer: Medicare Other

## 2014-05-19 NOTE — Telephone Encounter (Signed)
Message copied by Silvio PateMCCRACKEN, Joslyne Marshburn L on Tue May 19, 2014  8:29 AM ------      Message from: Glendale ChardPATEL, DONIKA K      Created: Tue May 19, 2014  8:00 AM       Please notify patient lab are within normal limits.  Thank you.       ------

## 2014-05-19 NOTE — Telephone Encounter (Signed)
Spoke with patient's daughter and made her aware labs okay. Dr Arbutus Leasat Lorain ChildesFYI- She is going to cancel Carotid US because she states they would not do surgery anyway.

## 2014-05-22 NOTE — Telephone Encounter (Signed)
Noted and agree. 

## 2014-05-25 ENCOUNTER — Other Ambulatory Visit (HOSPITAL_COMMUNITY): Payer: Medicare Other

## 2014-10-19 ENCOUNTER — Ambulatory Visit (INDEPENDENT_AMBULATORY_CARE_PROVIDER_SITE_OTHER): Payer: Medicare Other | Admitting: Neurology

## 2014-10-19 ENCOUNTER — Encounter: Payer: Self-pay | Admitting: Neurology

## 2014-10-19 VITALS — BP 102/64 | HR 72

## 2014-10-19 DIAGNOSIS — I619 Nontraumatic intracerebral hemorrhage, unspecified: Secondary | ICD-10-CM | POA: Diagnosis not present

## 2014-10-19 DIAGNOSIS — G20 Parkinson's disease: Secondary | ICD-10-CM

## 2014-10-19 DIAGNOSIS — IMO0002 Reserved for concepts with insufficient information to code with codable children: Secondary | ICD-10-CM

## 2014-10-19 DIAGNOSIS — I610 Nontraumatic intracerebral hemorrhage in hemisphere, subcortical: Secondary | ICD-10-CM

## 2014-10-19 DIAGNOSIS — F330 Major depressive disorder, recurrent, mild: Secondary | ICD-10-CM

## 2014-10-19 DIAGNOSIS — G20A1 Parkinson's disease without dyskinesia, without mention of fluctuations: Secondary | ICD-10-CM

## 2014-10-19 DIAGNOSIS — I69922 Dysarthria following unspecified cerebrovascular disease: Secondary | ICD-10-CM | POA: Diagnosis not present

## 2014-10-19 NOTE — Progress Notes (Signed)
Eric Oneill was seen today in the movement disorders clinic for neurologic consultation at the request of Willodean Rosenthal, MD.  The consultation is for the evaluation of Parkinson's disease.  The patient previously saw Dr. [name].  I last visit with Dr. [name] was in June, 2013.  It appears that the patient was seen by Dr. [name] on a yearly basis.  I have a copy of the June, 2013 note from Dr. [name] and I did review this.  The pt supplies his own hx.    The first symptom(s) the patient noticed was tremor in the L hand.  He was dx with PD in 2008.    He was not initally placed on medication, but once he started on medication, it was carbidopa/levodopa.  He was initially on the 50/200 CR but that has been changed and he is now on the 25/100 IR three times per day before meals.  He is on selegeline once per day and does not know if it helps.  It appears from notes that he was on it twice per day in the past.    04/07/13 update:  This patient is accompanied in the office by his child who supplements the history.  Last visit, I increased the patient's carbidopa/levodopa 25/100-1-1/2 tablets 3 times per day.  We also added carbidopa/levodopa 50/200 CR at bedtime.  He does think that the increase helped his overall symptoms, but notices that he seems to have more trouble, especially with tremor, between 12 PM and 4 PM.  He generally takes his medication at 7 AM, noon and 5 PM.  He has not fallen.  There are no hallucinations.  His daughter prepares his pill box and then he takes the medications.  About 5 days ago, he decided to try and stop the lisinopril as he did not think he needed it any longer.  He has been watching his blood pressure closely and it has remained good.  He has been having some cramping at night.  Some of the cramping is in the feet and some inappropriate longus muscle on the right.  He has had some difficulty falling asleep at night, but his daughter also reports that the patient takes two, 2 hour naps.  He  also decreased his selegiline so he was only taking it one time per day, at 7 AM.  He continues to complain of back pain.  Since our last visit the patient did have an MRI of the brain.  There was dilated Virchow-Robins spaces in the right basal ganglia, but I think that there is potentially an old lacune in the left basal ganglia.  An MRI of the lumbar spine demonstrated moderate central canal stenosis at the L4-L5 level and bilateral neural foraminal stenosis at the L5-S1 level.  06/09/13 update:  The patient is accompanied by his daughter who supplements the history.  The patient has Parkinson's disease.  He is currently on carbidopa/levodopa 25/100, 1-1/2 tablets at 7 AM/11 AM/3 PM/6 PM.  This is an increase of one additional dose since our last visit.  He is also on carbidopa/levodopa 50/200 at bedtime.  He increased his selegeline back to bid.  He overall feel good but continues to complain of dizziness.  Last visit, he had self discontinued the lisinopril, but he went back on it.  He had one fall since our last visit, that occurred in the yard while pulling weeds.  He did not get hurt.  He has not had any nausea or vomiting  or hallucinations.  09/03/13 update:  The patient is accompanied by his daughter who supplements the history.  I reviewed his hospital records since our last visit.  He does have a history of Parkinson's disease and is supposed to be on carbidopa/levodopa 25/100, 1-1/2 tablets at 7 AM/11 AM/3 PM/7 PM and on carbidopa/levodopa 50/200 at bedtime.  However, after several calls to the [facility], it appears that the patient is getting carbidopa/levodopa 25/100, 2 tablets 4 times per day (no times marked on the sheet and it is not marked the patient was given the medication) and the carbidopa/levodopa 50/200 at bedtime has been discontinued for unknown reason.  Since our last visit, the patient was hospitalized.  This was on 06/25/2013.  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  I reviewed those films. He saw Dr. Tilden Dome and Dr. Jannifer Franklin in the hospital.  I reviewed those notes.  He had a PEG tube placed.  He was minimally verbal at discharge.  He is now at a SNF and is making a remarkable recovery.  He is only using PEG at night for supplemental feeds and has advanced to a mechanical soft diet.  His speech is improving as well.  His daughter expresses frustration as he was getting therapy 5 days per week and now his insurance is only allowing 3 days per week.  They are paying out of pocket to have it the other days.  The pt is now making some attempts at walking in the parallel bars.    09/22/13 update:  The pt is accompanied by his daughters who supplement the hx.  Pt has PD with a fairly recent left BG hemmorhage, resulting in the R hemiparesis.  He is on carbidopa/levodopa 25/100, 2 at 7am, 1.5 at 11 am, 2 at 3 pm, 1.5 at 7 pm.  He is on an additional carbidopa/levodopa CR 50/200 at night.  The pt definitely noted a positive difference when we adjusted the medication.  He is continuing with therapy, although they are paying for some of the therapy out of pocket.  They are not sure when he will be released from therapy but think that it will be a while.  No swallowing issues.  Facial paresthesias on the R.  Some food pocketing on the R.    01/12/14 update:  The pt is accompanied by his daughter who supplement the hx.  Pt has PD with a fairly recent left BG hemmorhage, resulting in the R hemiparesis.  He is on carbidopa/levodopa 25/100, 2 at 7am, 1.5 at 11 am, 2 at 3 pm, 1.5 at 7 pm.  He is on an additional carbidopa/levodopa CR 50/200 at night.   Pt had DVT since last visit and I thought that a [place] filter was placed but apparently the [facility] physician talked with neurologist in [place] and after CT brain was done and didn't show evidence of brain bleed, he was placed on eloquis.  Doesn't walk so no fall risk.  Some tremor in the L  hand over the last month.  This is bothering him especially when picking up his drink.  Meds given in applesauce but he doesn't bite them.  Looking into getting a motorized scooter but has a VF cut so ECF helping him with that issue before issuing him one.  Some recent dizziness that they don't think related to BP change.  Mood good on Lexapro and they would like me to take over prescribing that.  05/18/14 update:  The patient returns today for followup, accompanied by his daughter who supplements the history.  Last visit, we tried to increase his levodopa to 2 tablets 4 times per day, but this caused dizziness and just decreasing the dose barely to 2 in the morning, 1-1/2 at 11 AM, 2 at 3 PM and one and a half at 7 PM took away the dizziness.  He remains on carbidopa/levodopa 50/200 at night.  He is nearly one year out from his basal ganglia hemorrhage that has left him nearly paralyzed on the right side, and he subsequently had a DVT in the right leg.  He is now on Eliquis because of this.  There has been no melena or hematochezia.  The patient is actually returning home this year for Thanksgiving for the day and this will be a big milestone for him.  The patient is complaining about significant phlegm buildup and he does not think that this is coming from saliva, but rather from postnasal drip.  He has tried Flonase without success.  He does complaining about increasing tremor on the left hand side.  He also has had some difficulty with constipation, but now is on both MiraLax and Colace at the extended care facility.  10/19/14 update:  Pt returns for f/u, accompanied by his daughter who supplements the history.  Pt remains on carbidopa/levodopa 25/100 2 in the morning, 1-1/2 at 11 AM, 2 at 3 PM and one and a half at 7 PM.  He remains on carbidopa/levodopa 50/200 at night.  Despite having a basal ganglia hemmorhage, he is on eloquis for DVT and they know the risks of this.  He decided not to proceeded with  the carotid u/s last visit despite a hx of stenosis as he said that he would not want any intervention anyway.  Pt and daughter relay that speech is getting softer and the side opposite the stroke (the non plegic side) is getting weaker.  The zyrtec did help some of his secretions.  C/o some LUE tremor with eating but daughter states that dizziness in the afternoon continues to be issue after taking med.  Had labs since last visit.  Total chol was 127, TG 145, B12 570.     PREVIOUS MEDICATIONS:  Azilect (no help); carbidopa/levodopa extended release in the past but was discontinued in favor of the IR; selegiline  ALLERGIES:   Allergies  Allergen Reactions  . Ivp Dye [Iodinated Diagnostic Agents] Other (See Comments)    Dizziness     CURRENT MEDICATIONS:    Medication List       This list is accurate as of: 10/19/14 10:19 AM.  Always use your most recent med list.               carbidopa-levodopa 25-100 MG per tablet  Commonly known as:  SINEMET IR  Take 1.5 tablets by mouth 4 (four) times daily. Take 2 tablets by mouth at 7 am, 1.5 tablets by mouth at 11 am, 2 tablets by mouth at 3 pm, 1.5 tablets by mouth at 7 pm     carbidopa-levodopa 50-200 MG per tablet  Commonly known as:  SINEMET CR  Take 1 tablet by mouth at bedtime.     cetirizine 10 MG tablet  Commonly known as:  ZYRTEC ALLERGY  Take 1 tablet (10 mg total) by mouth daily.     dextromethorphan-guaiFENesin 30-600 MG per 12 hr tablet  Commonly known as:  MUCINEX DM  Take 1 tablet by  mouth 2 (two) times daily.     docusate sodium 100 MG capsule  Commonly known as:  COLACE  Take 100 mg by mouth daily.     ELIQUIS 5 MG Tabs tablet  Generic drug:  apixaban  Take 5 mg by mouth 2 (two) times daily.     entacapone 200 MG tablet  Commonly known as:  COMTAN  Take 1 tablet (200 mg total) by mouth 3 (three) times daily.     escitalopram 10 MG tablet  Commonly known as:  LEXAPRO  Take 1 tablet (10 mg total) by mouth  daily.     omeprazole 10 MG capsule  Commonly known as:  PRILOSEC  20 mg by PEG Tube route daily.     polyethylene glycol packet  Commonly known as:  MIRALAX / GLYCOLAX  Take 17 g by mouth daily.     simvastatin 40 MG tablet  Commonly known as:  ZOCOR  Take 40 mg by mouth at bedtime.        PAST MEDICAL HISTORY:   Past Medical History  Diagnosis Date  . Coronary artery disease   . Parkinson disease     dx: 06/2007  . Depression   . Stroke     05-2013    PAST SURGICAL HISTORY:   Past Surgical History  Procedure Laterality Date  . Coronary artery bypass graft    . Hernia repair      x2  . Circumcision  Aug 2002  . Carpal tunnel release      L  . Knee surgery      bilateral  . Peg placement N/A 07/02/2013    Procedure: PERCUTANEOUS ENDOSCOPIC GASTROSTOMY (PEG) PLACEMENT;  Surgeon: Cherylynn Ridges, MD;  Location: [facility] ENDOSCOPY;  Service: General;  Laterality: N/A;    SOCIAL HISTORY:   History   Social History  . Marital Status: Widowed    Spouse Name: N/A  . Number of Children: N/A  . Years of Education: N/A   Occupational History  . retired     [employer]   Social History Main Topics  . Smoking status: Former Smoker    Quit date: 02/18/1958  . Smokeless tobacco: Never Used  . Alcohol Use: Yes     Comment: daily, glass wine daily  . Drug Use: No  . Sexual Activity: Not on file   Other Topics Concern  . Not on file   Social History Narrative    FAMILY HISTORY:   Family Status  Relation Status Death Age  . Mother Deceased 58    heart ds  . Father Deceased 50    CVA  . Brother      2, multiple myeloma; heart disease  . Sister      3, CAD, Breast CA    ROS:   A complete 10 system review of systems was obtained and was unremarkable apart from what is mentioned above.  PHYSICAL EXAMINATION:    VITALS:   Filed Vitals:   10/19/14 0954  BP: 102/64  Pulse: 72    GEN:  The patient appears stated age and is in NAD. HEENT:  Normocephalic,  atraumatic.  The mucous membranes are very dry. The superficial temporal arteries are without ropiness or tenderness. CV:  RRR Lungs:  CTAB Neck/HEME:  There are no carotid bruits bilaterally.  Neurological examination:  Orientation: The patient is alert and oriented x3. Fund of knowledge is appropriate.   Cranial nerves: There is R facial droop.  There is vertical, upbeat nystagmus.  The visual fields are full to confrontational testing. The speech is dysarthric but he was able to communicate in a meaningful, understandable way.   Soft palate rises symmetrically and there is no tongue deviation. Hearing is intact to conversational tone. Motor: Strength is 5/5 on the L but on the R there is 0/5 movement in the UE/LE with the exception of some shoulder movement on the right.   Movement examination: Tone: There is mild increased tone in the LUE.  On the right, there is loss of tone in the right upper and lower extremity. Abnormal movements: There is no tremor today Coordination:  There is mild decremation with RAM's, of the left.  This could not be tested on the right.     ASSESSMENT/PLAN:  1.  Parkinsonism.  I agree that this does represent idiopathic Parkinson's disease.  The patient has tremor, bradykinesia, rigidity and postural instability.  -We will continue with the carbidopa/levodopa 2 tablets at 7 AM/1-1/2 tablets at 11 AM/2 tablets at 3 PM/1-1/2 tablets at 7 PM and  carbidopa/levodopa 50/200 at bedtime.  Continue Comtan 200 mg 3 times a day.  Still has some tremor (I didn't see it but he c/o it) but don't think much more we can do as gets dizzy with higher dosages of levodopa. Risks, benefits, side effects and alternative therapies were discussed.  The opportunity to ask questions was given and they were answered to the best of my ability.  The patient expressed understanding and willingness to follow the outlined treatment protocols.  -order PT/OT/ST today 2. basal ganglia  hemorrhage in Nov 2014 resulting right hemiparesis and dysarthria.  -Worried about the addition of eloquis for recent DVT but risks are understood.   3.  Depression  -doing well with lexapro and will continue 4.  Allergic rhinitis  -zyrtec helped and should continue 5.  F/u 6 months

## 2014-12-09 ENCOUNTER — Telehealth: Payer: Self-pay | Admitting: *Deleted

## 2014-12-09 NOTE — Telephone Encounter (Signed)
Andrey CampanileSandy patients daughter called with questions and concerns with patients medication causing dizziness Call back number   [phone]

## 2014-12-09 NOTE — Telephone Encounter (Signed)
Left message for Sandy to call me back.

## 2014-12-14 ENCOUNTER — Telehealth: Payer: Self-pay | Admitting: *Deleted

## 2014-12-14 NOTE — Telephone Encounter (Signed)
I called Sandy back and she said that he always gets dizzy after his 3:00 dose.  She wants to know if he can drop a dose.  Please advise.

## 2014-12-14 NOTE — Telephone Encounter (Signed)
Try 1 1/2 at 3 instead of 2 tablets and see if it makes a difference

## 2014-12-15 NOTE — Telephone Encounter (Signed)
Patient's daughter aware of change. New order written and faxed to [facility] at [phone].

## 2015-01-18 ENCOUNTER — Telehealth: Payer: Self-pay | Admitting: Interventional Cardiology

## 2015-01-18 NOTE — Telephone Encounter (Signed)
New Message ° ° ° ° ° °Calling wanting to know if we received the Attestation faxed on 12/31/14. Please call back and advise. °

## 2015-01-18 NOTE — Telephone Encounter (Signed)
Unsure what call is related to. Returned call to [phone]. [phone]

## 2015-02-17 IMAGING — CT CT HEAD W/O CM
1 series · 16 of 30 positions shown, 20 images · non-contrast
Comparison: MR brain 02/21/2013.

CLINICAL DATA: Code stroke.  Fall with right facial droop.

EXAM:
CT HEAD WITHOUT CONTRAST
TECHNIQUE: Contiguous axial images were obtained from the base of the skull
through the vertex without intravenous contrast.

[Series 2: head 5.0 h30s · axial · 0.48mm/px · z∈[-137,+33]mm · 16 of 38 slices shown, 20 images]
[im 2/38  brain]
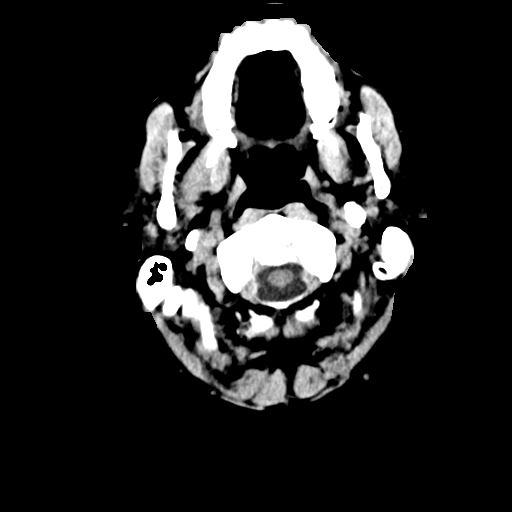
[im 2/38  bone]
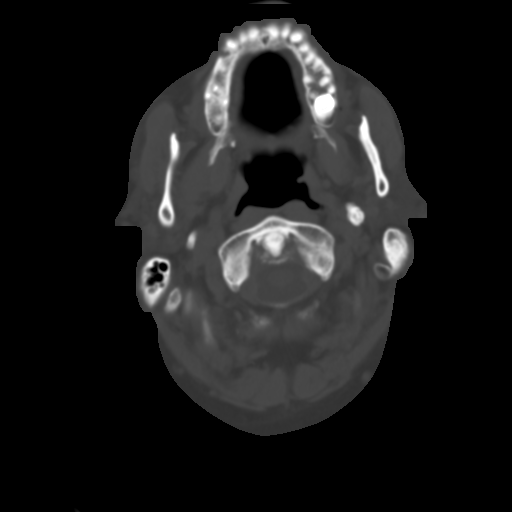
[im 4/38  brain]
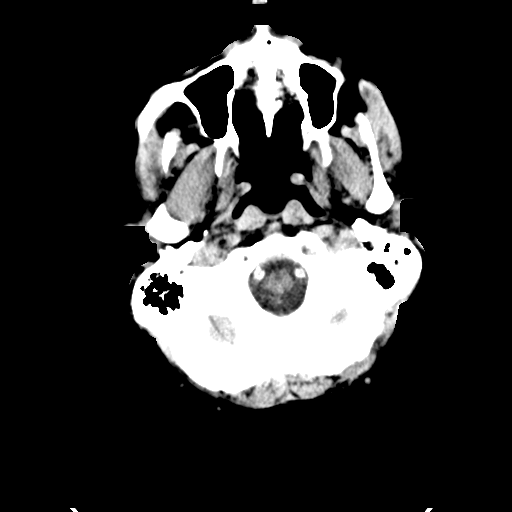
[im 7/38  brain]
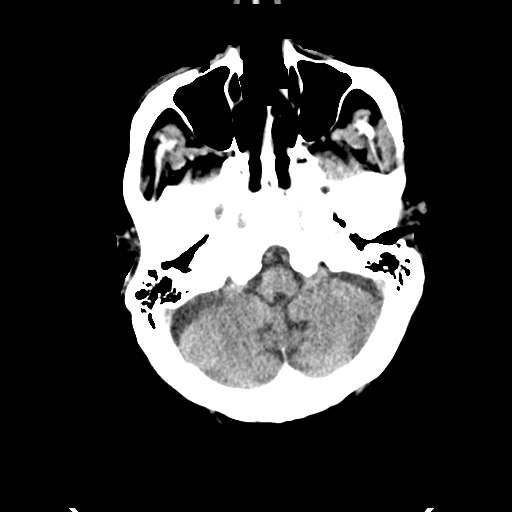
[im 9/38  brain]
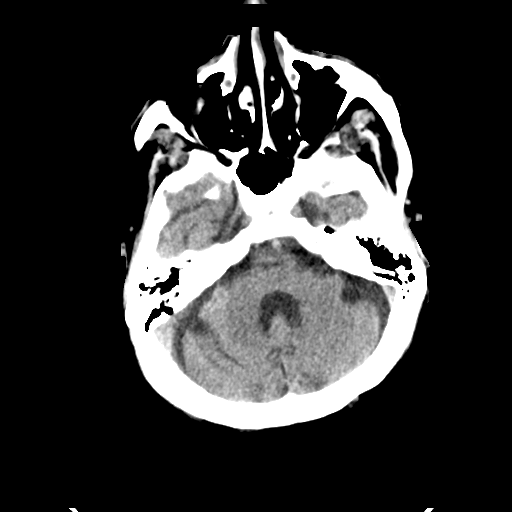
[im 11/38  brain]
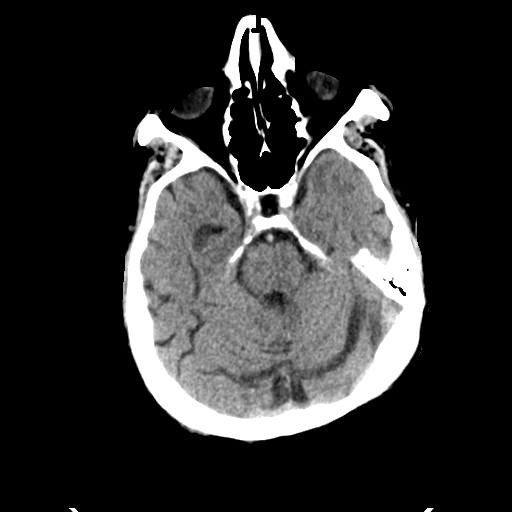
[im 11/38  bone]
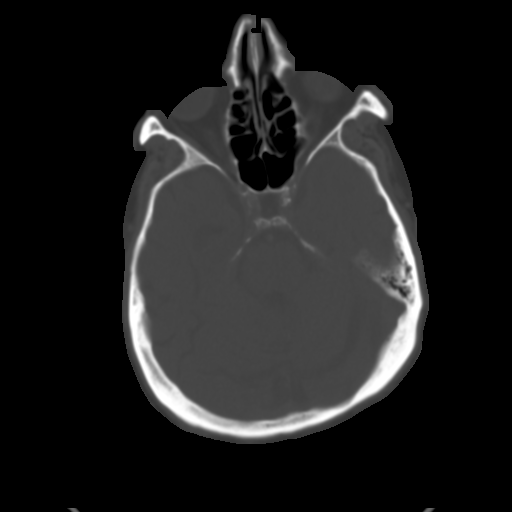
[im 13/38  brain]
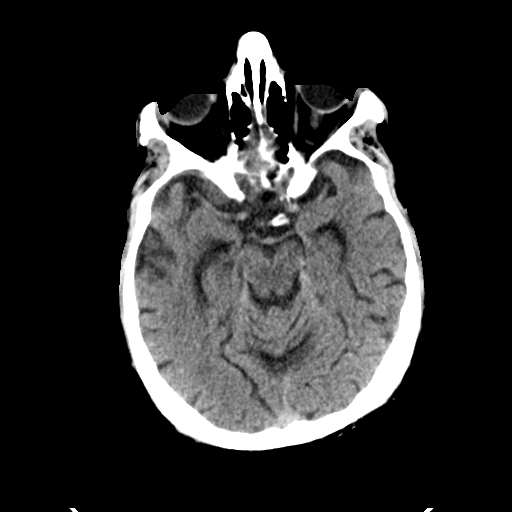
[im 16/38  brain]
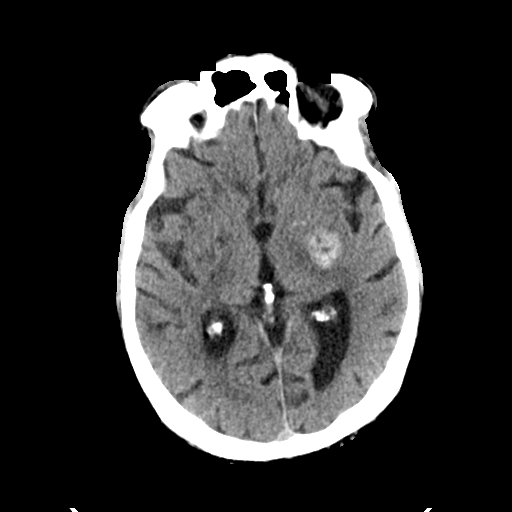
[im 18/38  brain]
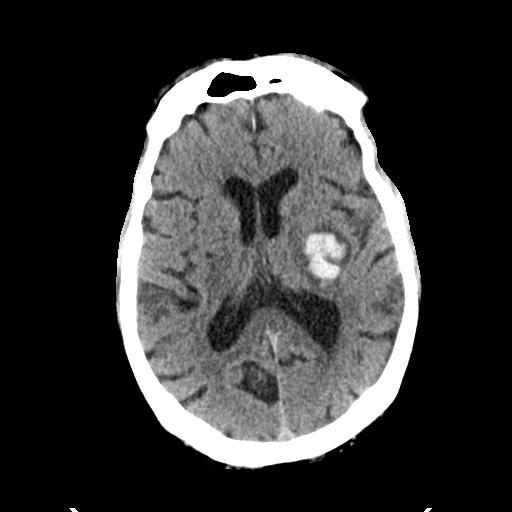
[im 20/38  brain]
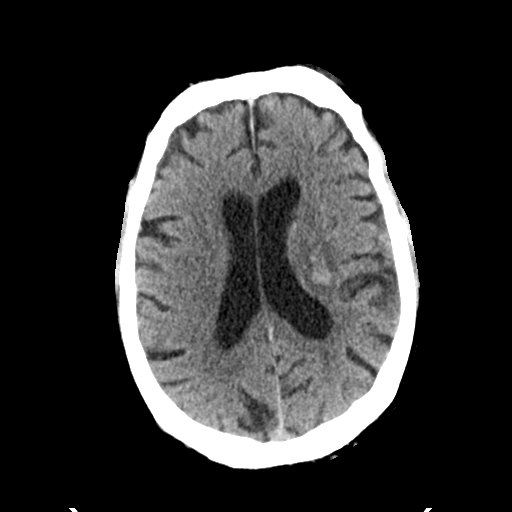
[im 20/38  bone]
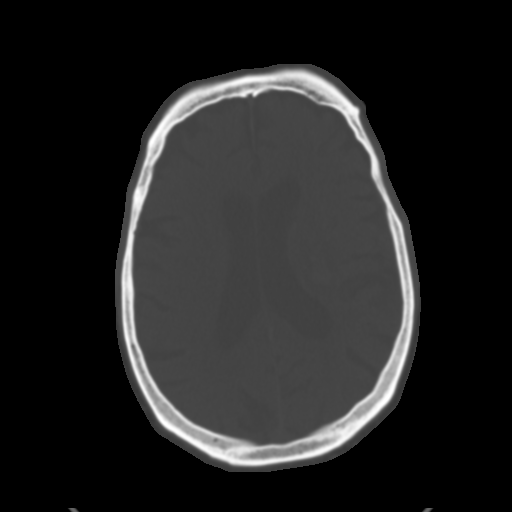
[im 22/38  brain]
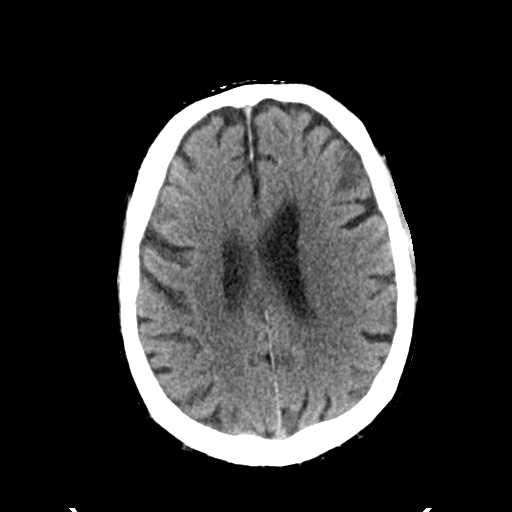
[im 25/38  brain]
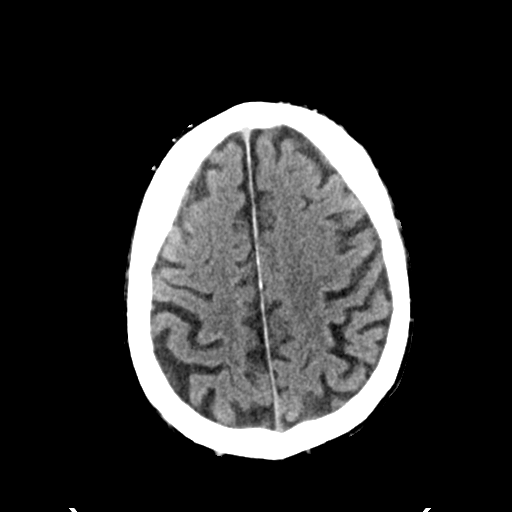
[im 27/38  brain]
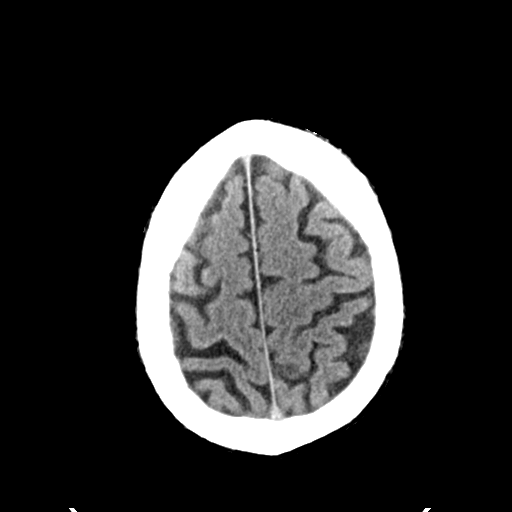
[im 29/38  brain]
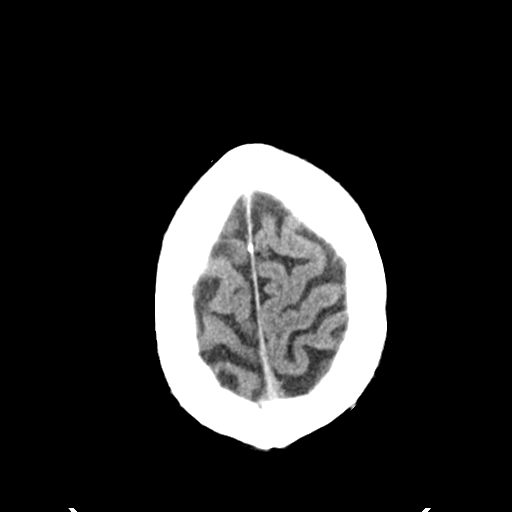
[im 29/38  bone]
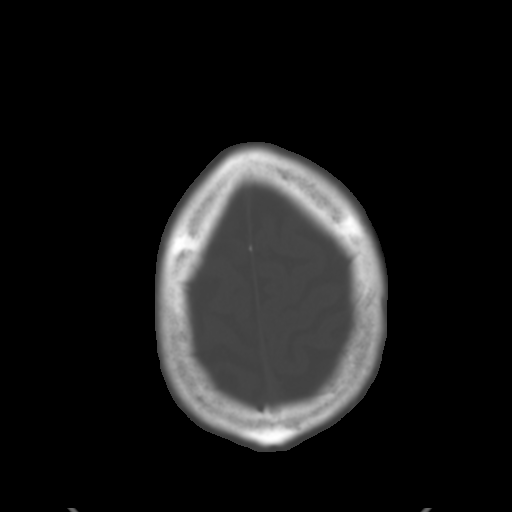
[im 31/38  brain]
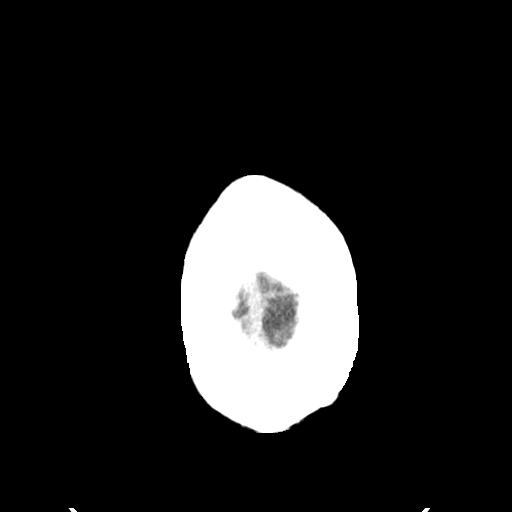
[im 34/38  brain]
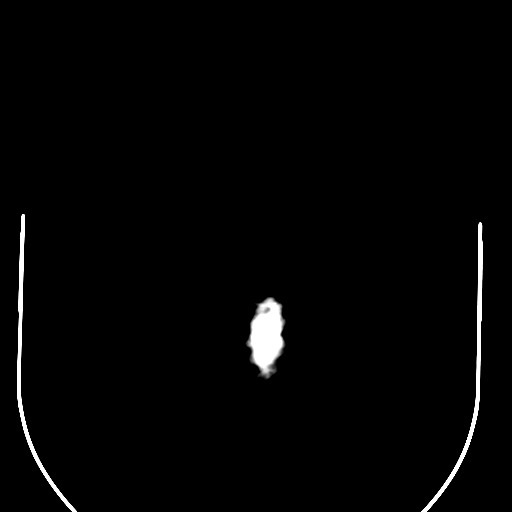
[im 36/38  brain]
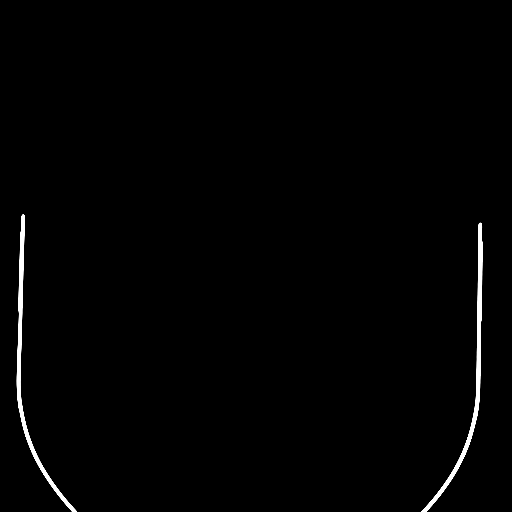

[16 of 30 positions shown; findings below may reference images not displayed]

FINDINGS: There is a rounded area of high attenuation in the left basal
ganglia, measuring 2.2 x 2.4 cm, with a thin rim of surrounding
low-attenuation edema. Approximately 3 mm of left-to-right midline
shift. No additional evidence of an acute infarct, mass lesion or
hydrocephalus. Atrophy. Periventricular low attenuation. Scattered
mucosal thickening in the paranasal sinuses. No air-fluid levels.
Mastoid air cells are clear.
IMPRESSION: 1. Left basal ganglia hematoma with 3 mm left-to-right midline
shift. Critical Value/emergent results were called by telephone at
the time of interpretation on 06/25/2013 at [DATE] to Dr.Inesis,
who verbally acknowledged these results.
2. Atrophy and chronic microvascular white matter ischemic changes.

## 2015-04-19 ENCOUNTER — Ambulatory Visit: Payer: Medicare Other | Admitting: Neurology

## 2015-04-26 ENCOUNTER — Ambulatory Visit (INDEPENDENT_AMBULATORY_CARE_PROVIDER_SITE_OTHER): Payer: Medicare Other | Admitting: Neurology

## 2015-04-26 ENCOUNTER — Encounter: Payer: Self-pay | Admitting: Neurology

## 2015-04-26 VITALS — BP 100/54 | HR 64 | Temp 98.4°F | Resp 16

## 2015-04-26 DIAGNOSIS — R319 Hematuria, unspecified: Secondary | ICD-10-CM

## 2015-04-26 DIAGNOSIS — F330 Major depressive disorder, recurrent, mild: Secondary | ICD-10-CM

## 2015-04-26 DIAGNOSIS — G20 Parkinson's disease: Secondary | ICD-10-CM

## 2015-04-26 DIAGNOSIS — G8100 Flaccid hemiplegia affecting unspecified side: Secondary | ICD-10-CM | POA: Diagnosis not present

## 2015-04-26 DIAGNOSIS — G8101 Flaccid hemiplegia affecting right dominant side: Secondary | ICD-10-CM

## 2015-04-26 NOTE — Patient Instructions (Signed)
Separate orders have been given on patient physician visit form

## 2015-04-26 NOTE — Progress Notes (Signed)
Eric Oneill was seen today in the movement disorders clinic for neurologic consultation at the request of Willodean Rosenthal, MD.  The consultation is for the evaluation of Parkinson's disease.  The patient previously saw Dr. [name].  I last visit with Dr. [name] was in June, 2013.  It appears that the patient was seen by Dr. [name] on a yearly basis.  I have a copy of the June, 2013 note from Dr. [name] and I did review this.  The pt supplies his own hx.    The first symptom(s) the patient noticed was tremor in the L hand.  He was dx with PD in 2008.    He was not initally placed on medication, but once he started on medication, it was carbidopa/levodopa.  He was initially on the 50/200 CR but that has been changed and he is now on the 25/100 IR three times per day before meals.  He is on selegeline once per day and does not know if it helps.  It appears from notes that he was on it twice per day in the past.    04/07/13 update:  This patient is accompanied in the office by his child who supplements the history.  Last visit, I increased the patient's carbidopa/levodopa 25/100-1-1/2 tablets 3 times per day.  We also added carbidopa/levodopa 50/200 CR at bedtime.  He does think that the increase helped his overall symptoms, but notices that he seems to have more trouble, especially with tremor, between 12 PM and 4 PM.  He generally takes his medication at 7 AM, noon and 5 PM.  He has not fallen.  There are no hallucinations.  His daughter prepares his pill box and then he takes the medications.  About 5 days ago, he decided to try and stop the lisinopril as he did not think he needed it any longer.  He has been watching his blood pressure closely and it has remained good.  He has been having some cramping at night.  Some of the cramping is in the feet and some inappropriate longus muscle on the right.  He has had some difficulty falling asleep at night, but his daughter also reports that the patient takes two, 2 hour naps.  He  also decreased his selegiline so he was only taking it one time per day, at 7 AM.  He continues to complain of back pain.  Since our last visit the patient did have an MRI of the brain.  There was dilated Virchow-Robins spaces in the right basal ganglia, but I think that there is potentially an old lacune in the left basal ganglia.  An MRI of the lumbar spine demonstrated moderate central canal stenosis at the L4-L5 level and bilateral neural foraminal stenosis at the L5-S1 level.  06/09/13 update:  The patient is accompanied by his daughter who supplements the history.  The patient has Parkinson's disease.  He is currently on carbidopa/levodopa 25/100, 1-1/2 tablets at 7 AM/11 AM/3 PM/6 PM.  This is an increase of one additional dose since our last visit.  He is also on carbidopa/levodopa 50/200 at bedtime.  He increased his selegeline back to bid.  He overall feel good but continues to complain of dizziness.  Last visit, he had self discontinued the lisinopril, but he went back on it.  He had one fall since our last visit, that occurred in the yard while pulling weeds.  He did not get hurt.  He has not had any nausea or vomiting  or hallucinations.  09/03/13 update:  The patient is accompanied by his daughter who supplements the history.  I reviewed his hospital records since our last visit.  He does have a history of Parkinson's disease and is supposed to be on carbidopa/levodopa 25/100, 1-1/2 tablets at 7 AM/11 AM/3 PM/7 PM and on carbidopa/levodopa 50/200 at bedtime.  However, after several calls to the [facility], it appears that the patient is getting carbidopa/levodopa 25/100, 2 tablets 4 times per day (no times marked on the sheet and it is not marked the patient was given the medication) and the carbidopa/levodopa 50/200 at bedtime has been discontinued for unknown reason.  Since our last visit, the patient was hospitalized.  This was on 06/25/2013.  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  I reviewed those films. He saw Dr. Tilden Dome and Dr. Jannifer Franklin in the hospital.  I reviewed those notes.  He had a PEG tube placed.  He was minimally verbal at discharge.  He is now at a SNF and is making a remarkable recovery.  He is only using PEG at night for supplemental feeds and has advanced to a mechanical soft diet.  His speech is improving as well.  His daughter expresses frustration as he was getting therapy 5 days per week and now his insurance is only allowing 3 days per week.  They are paying out of pocket to have it the other days.  The pt is now making some attempts at walking in the parallel bars.    09/22/13 update:  The pt is accompanied by his daughters who supplement the hx.  Pt has PD with a fairly recent left BG hemmorhage, resulting in the R hemiparesis.  He is on carbidopa/levodopa 25/100, 2 at 7am, 1.5 at 11 am, 2 at 3 pm, 1.5 at 7 pm.  He is on an additional carbidopa/levodopa CR 50/200 at night.  The pt definitely noted a positive difference when we adjusted the medication.  He is continuing with therapy, although they are paying for some of the therapy out of pocket.  They are not sure when he will be released from therapy but think that it will be a while.  No swallowing issues.  Facial paresthesias on the R.  Some food pocketing on the R.    01/12/14 update:  The pt is accompanied by his daughter who supplement the hx.  Pt has PD with a fairly recent left BG hemmorhage, resulting in the R hemiparesis.  He is on carbidopa/levodopa 25/100, 2 at 7am, 1.5 at 11 am, 2 at 3 pm, 1.5 at 7 pm.  He is on an additional carbidopa/levodopa CR 50/200 at night.   Pt had DVT since last visit and I thought that a [place] filter was placed but apparently the [facility] physician talked with neurologist in [place] and after CT brain was done and didn't show evidence of brain bleed, he was placed on eloquis.  Doesn't walk so no fall risk.  Some tremor in the L  hand over the last month.  This is bothering him especially when picking up his drink.  Meds given in applesauce but he doesn't bite them.  Looking into getting a motorized scooter but has a VF cut so ECF helping him with that issue before issuing him one.  Some recent dizziness that they don't think related to BP change.  Mood good on Lexapro and they would like me to take over prescribing that.  05/18/14 update:  The patient returns today for followup, accompanied by his daughter who supplements the history.  Last visit, we tried to increase his levodopa to 2 tablets 4 times per day, but this caused dizziness and just decreasing the dose barely to 2 in the morning, 1-1/2 at 11 AM, 2 at 3 PM and one and a half at 7 PM took away the dizziness.  He remains on carbidopa/levodopa 50/200 at night.  He is nearly one year out from his basal ganglia hemorrhage that has left him nearly paralyzed on the right side, and he subsequently had a DVT in the right leg.  He is now on Eliquis because of this.  There has been no melena or hematochezia.  The patient is actually returning home this year for Thanksgiving for the day and this will be a big milestone for him.  The patient is complaining about significant phlegm buildup and he does not think that this is coming from saliva, but rather from postnasal drip.  He has tried Flonase without success.  He does complaining about increasing tremor on the left hand side.  He also has had some difficulty with constipation, but now is on both MiraLax and Colace at the extended care facility.  10/19/14 update:  Pt returns for f/u, accompanied by his daughter who supplements the history.  Pt remains on carbidopa/levodopa 25/100 2 in the morning, 1-1/2 at 11 AM, 2 at 3 PM and one and a half at 7 PM.  He remains on carbidopa/levodopa 50/200 at night.  Despite having a basal ganglia hemmorhage, he is on eloquis for DVT and they know the risks of this.  He decided not to proceeded with  the carotid u/s last visit despite a hx of stenosis as he said that he would not want any intervention anyway.  Pt and daughter relay that speech is getting softer and the side opposite the stroke (the non plegic side) is getting weaker.  The zyrtec did help some of his secretions.  C/o some LUE tremor with eating but daughter states that dizziness in the afternoon continues to be issue after taking med.  Had labs since last visit.  Total chol was 127, TG 145, B12 570.    04/26/15 update:  The patient presents today, accompanied by his daughter who supplements the history.  Last visit, he was on carbidopa/levodopa 25/100, 2 tablets at 7 AM, 1-1/2 tablet at 11 AM, 2 tablets at 3 PM and one and a half tablets at 7 PM, in addition to carbidopa/levodopa 50/200 at night.  His daughter called me in May 20 now that he would become dizzy after the 3 PM dose and we slightly decreased the 3 PM dosage to carbidopa/levodopa 25/100,  1-1/2 tablets. He has had more tremor and continues to c/o dizziness.  He hates the tremor because spills coffee and has trouble eating with the only had that can move.   I did write an order last visit for physical, occupational and speech therapy at the extended care facility.    His daughter states that they only did speech but they refused to do PT and OT.  He is reporting that he has had 3 episodes of possible blood in his depends over the last 2 weeks.  He denies any melanoma or her medication.   PREVIOUS MEDICATIONS:  Azilect (no help); carbidopa/levodopa extended release in the past but was discontinued in favor of the IR; selegiline  ALLERGIES:   Allergies  Allergen Reactions  .  Ivp Dye [Iodinated Diagnostic Agents] Other (See Comments)    Dizziness     CURRENT MEDICATIONS:    Medication List       This list is accurate as of: 04/26/15 11:21 AM.  Always use your most recent med list.               carbidopa-levodopa 25-100 MG per tablet  Commonly known as:  SINEMET  IR  Take 1.5 tablets by mouth 4 (four) times daily. Take 2 tablets by mouth at 7 am, 1.5 tablets by mouth at 11 am, 2 tablets by mouth at 3 pm, 1.5 tablets by mouth at 7 pm     carbidopa-levodopa 50-200 MG per tablet  Commonly known as:  SINEMET CR  Take 1 tablet by mouth at bedtime.     cetirizine 10 MG tablet  Commonly known as:  ZYRTEC ALLERGY  Take 1 tablet (10 mg total) by mouth daily.     dextromethorphan-guaiFENesin 30-600 MG per 12 hr tablet  Commonly known as:  MUCINEX DM  Take 1 tablet by mouth 2 (two) times daily.     docusate sodium 100 MG capsule  Commonly known as:  COLACE  Take 100 mg by mouth daily.     ELIQUIS 5 MG Tabs tablet  Generic drug:  apixaban  Take 5 mg by mouth 2 (two) times daily.     entacapone 200 MG tablet  Commonly known as:  COMTAN  Take 1 tablet (200 mg total) by mouth 3 (three) times daily.     escitalopram 10 MG tablet  Commonly known as:  LEXAPRO  Take 1 tablet (10 mg total) by mouth daily.     omeprazole 10 MG capsule  Commonly known as:  PRILOSEC  20 mg by PEG Tube route daily.     polyethylene glycol packet  Commonly known as:  MIRALAX / GLYCOLAX  Take 17 g by mouth daily.     simvastatin 40 MG tablet  Commonly known as:  ZOCOR  Take 40 mg by mouth at bedtime.        PAST MEDICAL HISTORY:   Past Medical History  Diagnosis Date  . Coronary artery disease   . Parkinson disease     dx: 06/2007  . Depression   . Stroke     05-2013    PAST SURGICAL HISTORY:   Past Surgical History  Procedure Laterality Date  . Coronary artery bypass graft    . Hernia repair      x2  . Circumcision  Aug 2002  . Carpal tunnel release      L  . Knee surgery      bilateral  . Peg placement N/A 07/02/2013    Procedure: PERCUTANEOUS ENDOSCOPIC GASTROSTOMY (PEG) PLACEMENT;  Surgeon: Gwenyth Ober, MD;  Location: [facility];  Service: General;  Laterality: N/A;    SOCIAL HISTORY:   Social History   Social History  . Marital  Status: Widowed    Spouse Name: N/A  . Number of Children: N/A  . Years of Education: N/A   Occupational History  . retired     [employer]   Social History Main Topics  . Smoking status: Former Smoker    Quit date: 02/18/1958  . Smokeless tobacco: Never Used  . Alcohol Use: Yes     Comment: daily, glass wine daily  . Drug Use: No  . Sexual Activity: Not on file   Other Topics Concern  . Not on file  Social History Narrative    FAMILY HISTORY:   Family Status  Relation Status Death Age  . Mother Deceased 21    heart ds  . Father Deceased 60    CVA  . Brother      2, multiple myeloma; heart disease  . Sister      3, CAD, Breast CA    ROS:   A complete 10 system review of systems was obtained and was unremarkable apart from what is mentioned above.  PHYSICAL EXAMINATION:    VITALS:   Filed Vitals:   04/26/15 1103  BP: 100/54  Pulse: 64  Temp: 98.4 F (36.9 C)  TempSrc: Oral  Resp: 16  SpO2: 98%    GEN:  The patient appears stated age and is in NAD. HEENT:  Normocephalic, atraumatic.  The mucous membranes are very dry. The superficial temporal arteries are without ropiness or tenderness. CV:  RRR Lungs:  CTAB Neck/HEME:  There are no carotid bruits bilaterally.  Neurological examination:  Orientation: The patient is alert and oriented x3. Fund of knowledge is appropriate.   Cranial nerves: There is R facial droop.    There is vertical, upbeat nystagmus.  The visual fields are full to confrontational testing. The speech is dysarthric but he was able to communicate in a meaningful, understandable way.   Soft palate rises symmetrically and there is no tongue deviation. Hearing is intact to conversational tone. Motor: Strength is 5/5 on the L but on the R there is 0/5 movement in the UE/LE with the exception of some shoulder movement on the right.   Movement examination: Tone: There is normal tone in the LUE.  On the right, there is loss of tone in the right  upper and lower extremity. Abnormal movements: There is LUE tremor today, intermittent and more with use of the hand. Coordination:  There is mild decremation with RAM's, of the left.  This could not be tested on the right.     ASSESSMENT/PLAN:  1.  Idiopathic Parkinson's disease.  The patient has tremor, bradykinesia, rigidity and postural instability.  -We will continue with the carbidopa/levodopa 2 tablets at 7 AM/1-1/2 tablets at 11 AM/3 PM/ 7 PM and  carbidopa/levodopa 50/200 at bedtime.   -d/c comtan.  I doubt the Comtan is helping that much and it could be contributed to dizziness.  In addition, it is possible that the discoloration he is seeing in the dependence is from [place], which can cause the urine to turn orange.  -order PT/OT/ST today 2. 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  -Worried about the addition of eloquis for DVT but risks are understood.  As above, has noticed a discoloration in his depends.  It could be from Comtan, but could be blood.  I am going to try to get a catheterization specimen, but that in itself could cause trauma, so as long as there is not a lot of blood, I think we will continue him on it, knowing the risks. 3.  Depression  -doing well with lexapro and will continue 4.  Allergic rhinitis  -zyrtec helped and should continue 5.  F/u 6 months.  Much greater than 50% of this visit was spent in counseling with the patient and the family.  Total face to face time:  30 min

## 2015-06-21 ENCOUNTER — Telehealth: Payer: Self-pay | Admitting: Neurology

## 2015-06-21 NOTE — Telephone Encounter (Signed)
PT's daughter Andrey CampanileSandy called in regards to his medication [place] CB# [phone]

## 2015-06-22 NOTE — Telephone Encounter (Signed)
Spoke with patient's daughter and she states patient's tremor has increased in the last week and a half. He is having trouble eating and dialing a phone. He was taken off Comtan at the end of September and she is questioning whether he needs to go back on the medication. After further questioning, she states he has been dealing with an upper respiratory infection for the last couple weeks and is on antibiotics and breathing treatments. Patient's daughter made aware that some breathing medications can make tremor worse and it is probably due to this and not the medication he stopped two months ago. Aware we should wait and see if it gets better once he gets over this infection, but I will run this by Dr Tat and get back to her. Dr Arbutus Leasat - please advise.

## 2015-06-22 NOTE — Telephone Encounter (Signed)
Patient's daughter made aware. She will call back if no better after treatment complete.

## 2015-06-22 NOTE — Telephone Encounter (Signed)
I agree.  Let's wait until he gets better to make medication changes

## 2015-07-05 ENCOUNTER — Other Ambulatory Visit: Payer: Self-pay | Admitting: Urology

## 2015-07-05 DIAGNOSIS — R310 Gross hematuria: Secondary | ICD-10-CM

## 2015-07-12 ENCOUNTER — Ambulatory Visit (HOSPITAL_COMMUNITY)
Admission: RE | Admit: 2015-07-12 | Discharge: 2015-07-12 | Disposition: A | Discharge status: Home / Self Care | Payer: Medicare Other | Source: Ambulatory Visit | Attending: Urology | Admitting: Urology

## 2015-07-12 DIAGNOSIS — N200 Calculus of kidney: Secondary | ICD-10-CM | POA: Insufficient documentation

## 2015-07-12 DIAGNOSIS — N289 Disorder of kidney and ureter, unspecified: Secondary | ICD-10-CM | POA: Insufficient documentation

## 2015-07-12 DIAGNOSIS — K5730 Diverticulosis of large intestine without perforation or abscess without bleeding: Secondary | ICD-10-CM | POA: Insufficient documentation

## 2015-07-12 DIAGNOSIS — R310 Gross hematuria: Secondary | ICD-10-CM | POA: Diagnosis not present

## 2015-07-12 DIAGNOSIS — I2510 Atherosclerotic heart disease of native coronary artery without angina pectoris: Secondary | ICD-10-CM | POA: Diagnosis not present

## 2015-07-12 DIAGNOSIS — Z951 Presence of aortocoronary bypass graft: Secondary | ICD-10-CM | POA: Insufficient documentation

## 2015-08-30 ENCOUNTER — Telehealth: Payer: Self-pay | Admitting: Neurology

## 2015-08-30 ENCOUNTER — Encounter: Payer: Self-pay | Admitting: Neurology

## 2015-08-30 ENCOUNTER — Ambulatory Visit (INDEPENDENT_AMBULATORY_CARE_PROVIDER_SITE_OTHER): Payer: Medicare Other | Admitting: Neurology

## 2015-08-30 VITALS — BP 130/68 | HR 68

## 2015-08-30 DIAGNOSIS — G8101 Flaccid hemiplegia affecting right dominant side: Secondary | ICD-10-CM

## 2015-08-30 DIAGNOSIS — K117 Disturbances of salivary secretion: Secondary | ICD-10-CM

## 2015-08-30 DIAGNOSIS — G20 Parkinson's disease: Secondary | ICD-10-CM

## 2015-08-30 DIAGNOSIS — G20A1 Parkinson's disease without dyskinesia, without mention of fluctuations: Secondary | ICD-10-CM

## 2015-08-30 MED ORDER — ATROPINE SULFATE 1 % OP SOLN: OPHTHALMIC | Status: DC

## 2015-08-30 NOTE — Progress Notes (Signed)
Eric Oneill was seen today in the movement disorders clinic for neurologic consultation at the request of Willodean Rosenthal, MD.  The consultation is for the evaluation of Parkinson's disease.  The patient previously saw Dr. [name].  I last visit with Dr. [name] was in June, 2013.  It appears that the patient was seen by Dr. [name] on a yearly basis.  I have a copy of the June, 2013 note from Dr. [name] and I did review this.  The pt supplies his own hx.    The first symptom(s) the patient noticed was tremor in the L hand.  He was dx with PD in 2008.    He was not initally placed on medication, but once he started on medication, it was carbidopa/levodopa.  He was initially on the 50/200 CR but that has been changed and he is now on the 25/100 IR three times per day before meals.  He is on selegeline once per day and does not know if it helps.  It appears from notes that he was on it twice per day in the past.    04/07/13 update:  This patient is accompanied in the office by his child who supplements the history.  Last visit, I increased the patient's carbidopa/levodopa 25/100-1-1/2 tablets 3 times per day.  We also added carbidopa/levodopa 50/200 CR at bedtime.  He does think that the increase helped his overall symptoms, but notices that he seems to have more trouble, especially with tremor, between 12 PM and 4 PM.  He generally takes his medication at 7 AM, noon and 5 PM.  He has not fallen.  There are no hallucinations.  His daughter prepares his pill box and then he takes the medications.  About 5 days ago, he decided to try and stop the lisinopril as he did not think he needed it any longer.  He has been watching his blood pressure closely and it has remained good.  He has been having some cramping at night.  Some of the cramping is in the feet and some inappropriate longus muscle on the right.  He has had some difficulty falling asleep at night, but his daughter also reports that the patient takes two, 2 hour naps.  He  also decreased his selegiline so he was only taking it one time per day, at 7 AM.  He continues to complain of back pain.  Since our last visit the patient did have an MRI of the brain.  There was dilated Virchow-Robins spaces in the right basal ganglia, but I think that there is potentially an old lacune in the left basal ganglia.  An MRI of the lumbar spine demonstrated moderate central canal stenosis at the L4-L5 level and bilateral neural foraminal stenosis at the L5-S1 level.  06/09/13 update:  The patient is accompanied by his daughter who supplements the history.  The patient has Parkinson's disease.  He is currently on carbidopa/levodopa 25/100, 1-1/2 tablets at 7 AM/11 AM/3 PM/6 PM.  This is an increase of one additional dose since our last visit.  He is also on carbidopa/levodopa 50/200 at bedtime.  He increased his selegeline back to bid.  He overall feel good but continues to complain of dizziness.  Last visit, he had self discontinued the lisinopril, but he went back on it.  He had one fall since our last visit, that occurred in the yard while pulling weeds.  He did not get hurt.  He has not had any nausea or vomiting  or hallucinations.  09/03/13 update:  The patient is accompanied by his daughter who supplements the history.  I reviewed his hospital records since our last visit.  He does have a history of Parkinson's disease and is supposed to be on carbidopa/levodopa 25/100, 1-1/2 tablets at 7 AM/11 AM/3 PM/7 PM and on carbidopa/levodopa 50/200 at bedtime.  However, after several calls to the ECF, it appears that the patient is getting carbidopa/levodopa 25/100, 2 tablets 4 times per day (no times marked on the sheet and it is not marked the patient was given the medication) and the carbidopa/levodopa 50/200 at bedtime has been discontinued for unknown reason.  Since our last visit, the patient was hospitalized.  This was on 06/25/2013.  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  I reviewed those films. He saw Dr. Tilden Dome and Dr. Jannifer Franklin in the hospital.  I reviewed those notes.  He had a PEG tube placed.  He was minimally verbal at discharge.  He is now at a SNF and is making a remarkable recovery.  He is only using PEG at night for supplemental feeds and has advanced to a mechanical soft diet.  His speech is improving as well.  His daughter expresses frustration as he was getting therapy 5 days per week and now his insurance is only allowing 3 days per week.  They are paying out of pocket to have it the other days.  The pt is now making some attempts at walking in the parallel bars.    09/22/13 update:  The pt is accompanied by his daughters who supplement the hx.  Pt has PD with a fairly recent left BG hemmorhage, resulting in the R hemiparesis.  He is on carbidopa/levodopa 25/100, 2 at 7am, 1.5 at 11 am, 2 at 3 pm, 1.5 at 7 pm.  He is on an additional carbidopa/levodopa CR 50/200 at night.  The pt definitely noted a positive difference when we adjusted the medication.  He is continuing with therapy, although they are paying for some of the therapy out of pocket.  They are not sure when he will be released from therapy but think that it will be a while.  No swallowing issues.  Facial paresthesias on the R.  Some food pocketing on the R.    01/12/14 update:  The pt is accompanied by his daughter who supplement the hx.  Pt has PD with a fairly recent left BG hemmorhage, resulting in the R hemiparesis.  He is on carbidopa/levodopa 25/100, 2 at 7am, 1.5 at 11 am, 2 at 3 pm, 1.5 at 7 pm.  He is on an additional carbidopa/levodopa CR 50/200 at night.   Pt had DVT since last visit and I thought that a [place] filter was placed but apparently the [facility] physician talked with neurologist in [place] and after CT brain was done and didn't show evidence of brain bleed, he was placed on eloquis.  Doesn't walk so no fall risk.  Some tremor in the L  hand over the last month.  This is bothering him especially when picking up his drink.  Meds given in applesauce but he doesn't bite them.  Looking into getting a motorized scooter but has a VF cut so ECF helping him with that issue before issuing him one.  Some recent dizziness that they don't think related to BP change.  Mood good on Lexapro and they would like me to take over prescribing that.  05/18/14 update:  The patient returns today for followup, accompanied by his daughter who supplements the history.  Last visit, we tried to increase his levodopa to 2 tablets 4 times per day, but this caused dizziness and just decreasing the dose barely to 2 in the morning, 1-1/2 at 11 AM, 2 at 3 PM and one and a half at 7 PM took away the dizziness.  He remains on carbidopa/levodopa 50/200 at night.  He is nearly one year out from his basal ganglia hemorrhage that has left him nearly paralyzed on the right side, and he subsequently had a DVT in the right leg.  He is now on Eliquis because of this.  There has been no melena or hematochezia.  The patient is actually returning home this year for Thanksgiving for the day and this will be a big milestone for him.  The patient is complaining about significant phlegm buildup and he does not think that this is coming from saliva, but rather from postnasal drip.  He has tried Flonase without success.  He does complaining about increasing tremor on the left hand side.  He also has had some difficulty with constipation, but now is on both MiraLax and Colace at the extended care facility.  10/19/14 update:  Pt returns for f/u, accompanied by his daughter who supplements the history.  Pt remains on carbidopa/levodopa 25/100 2 in the morning, 1-1/2 at 11 AM, 2 at 3 PM and one and a half at 7 PM.  He remains on carbidopa/levodopa 50/200 at night.  Despite having a basal ganglia hemmorhage, he is on eloquis for DVT and they know the risks of this.  He decided not to proceeded with  the carotid u/s last visit despite a hx of stenosis as he said that he would not want any intervention anyway.  Pt and daughter relay that speech is getting softer and the side opposite the stroke (the non plegic side) is getting weaker.  The zyrtec did help some of his secretions.  C/o some LUE tremor with eating but daughter states that dizziness in the afternoon continues to be issue after taking med.  Had labs since last visit.  Total chol was 127, TG 145, B12 570.    04/26/15 update:  The patient presents today, accompanied by his daughter who supplements the history.  Last visit, he was on carbidopa/levodopa 25/100, 2 tablets at 7 AM, 1-1/2 tablet at 11 AM, 2 tablets at 3 PM and one and a half tablets at 7 PM, in addition to carbidopa/levodopa 50/200 at night.  His daughter called me in May 20 now that he would become dizzy after the 3 PM dose and we slightly decreased the 3 PM dosage to carbidopa/levodopa 25/100,  1-1/2 tablets. He has had more tremor and continues to c/o dizziness.  He hates the tremor because spills coffee and has trouble eating with the only had that can move.   I did write an order last visit for physical, occupational and speech therapy at the extended care facility.    His daughter states that they only did speech but they refused to do PT and OT.  He is reporting that he has had 3 episodes of possible blood in his depends over the last 2 weeks.  He denies any melanoma or her medication.  08/30/15 update:  The patient follows up today, accompanied by his daughter who supplements the history.  He is on carbidopa/levodopa 25/100, 2 tablets at 7 AM, 1-1/2 tablets at  11 AM, 3 PM, 7 PM and carbidopa/levodopa 50/200 at night.  I discontinued his Comtan last visit.  This did not seem to make a huge difference and he is doing about the same.  He does have a history of depression and is on Lexapro for that.  I did have him attend physical therapy at the SNF since our last visit.  Helped but  daughter states that he needs ST, for speech and swallowing.  Has G tube but don't want to use or do MBE (understand consequences but think that this may be his last pleasure).  Drooling more.  Reports that he had hematuria and a 8m kidney stone was found.  Taken off of eliquis for a while but put back on and recently had a little blood.  Has repeat scan upcoming in April.  They talked to him about lithotripsy but he would have to be off eliquis.     PREVIOUS MEDICATIONS:  Azilect (no help); carbidopa/levodopa extended release in the past but was discontinued in favor of the IR; selegiline  ALLERGIES:   Allergies  Allergen Reactions  . Ivp Dye [Iodinated Diagnostic Agents] Other (See Comments)    Dizziness     CURRENT MEDICATIONS:    Medication List       This list is accurate as of: 08/30/15 11:00 AM.  Always use your most recent med list.               carbidopa-levodopa 25-100 MG tablet  Commonly known as:  SINEMET IR  Take by mouth. Take 2 tablets by mouth at 7 am, 1.5 tablets by mouth at 11 am, 2 tablets by mouth at 3 pm, 1.5 tablets by mouth at 7 pm     carbidopa-levodopa 50-200 MG tablet  Commonly known as:  SINEMET CR  Take 1 tablet by mouth at bedtime.     cetirizine 10 MG tablet  Commonly known as:  ZYRTEC ALLERGY  Take 1 tablet (10 mg total) by mouth daily.     dextromethorphan-guaiFENesin 30-600 MG 12hr tablet  Commonly known as:  MUCINEX DM  Take 1 tablet by mouth 2 (two) times daily.     docusate sodium 100 MG capsule  Commonly known as:  COLACE  Take 100 mg by mouth daily.     ELIQUIS 5 MG Tabs tablet  Generic drug:  apixaban  Take 5 mg by mouth 2 (two) times daily.     entacapone 200 MG tablet  Commonly known as:  COMTAN  Take 1 tablet (200 mg total) by mouth 3 (three) times daily.     escitalopram 10 MG tablet  Commonly known as:  LEXAPRO  Take 1 tablet (10 mg total) by mouth daily.     omeprazole 10 MG capsule  Commonly known as:  PRILOSEC   20 mg by PEG Tube route daily.     polyethylene glycol packet  Commonly known as:  MIRALAX / GLYCOLAX  Take 17 g by mouth daily.     simvastatin 40 MG tablet  Commonly known as:  ZOCOR  Take 40 mg by mouth at bedtime.        PAST MEDICAL HISTORY:   Past Medical History  Diagnosis Date  . Coronary artery disease   . Parkinson disease ([facility]     dx: 06/2007  . Depression   . Stroke ([facility]     05-2013    PAST SURGICAL HISTORY:   Past Surgical History  Procedure Laterality Date  . Coronary  artery bypass graft    . Hernia repair      x2  . Circumcision  Aug 2002  . Carpal tunnel release      L  . Knee surgery      bilateral  . Peg placement N/A 07/02/2013    Procedure: PERCUTANEOUS ENDOSCOPIC GASTROSTOMY (PEG) PLACEMENT;  Surgeon: Gwenyth Ober, MD;  Location: [place];  Service: General;  Laterality: N/A;    SOCIAL HISTORY:   Social History   Social History  . Marital Status: Widowed    Spouse Name: N/A  . Number of Children: N/A  . Years of Education: N/A   Occupational History  . retired     [employer]   Social History Main Topics  . Smoking status: Former Smoker    Quit date: 02/18/1958  . Smokeless tobacco: Never Used  . Alcohol Use: Yes     Comment: daily, glass wine daily  . Drug Use: No  . Sexual Activity: Not on file   Other Topics Concern  . Not on file   Social History Narrative    FAMILY HISTORY:   Family Status  Relation Status Death Age  . Mother Deceased 74    heart ds  . Father Deceased 61    CVA  . Brother      2, multiple myeloma; heart disease  . Sister      3, CAD, Breast CA    ROS:   A complete 10 system review of systems was obtained and was unremarkable apart from what is mentioned above.  PHYSICAL EXAMINATION:    VITALS:   Filed Vitals:   08/30/15 1057  BP: 130/68  Pulse: 68    GEN:  The patient appears stated age and is in NAD. HEENT:  Normocephalic, atraumatic.  The mucous membranes are very dry. The  superficial temporal arteries are without ropiness or tenderness.  Holding drool cloth CV:  RRR Lungs:  CTAB Neck/HEME:  There are no carotid bruits bilaterally.  Neurological examination:  Orientation: The patient is alert and oriented x3. Fund of knowledge is appropriate.   Cranial nerves: There is R facial droop.    There is vertical, upbeat nystagmus.  The visual fields are full to confrontational testing. The speech is dysarthric but he was able to communicate in a meaningful, understandable way.  He has very little spontaneity of speech.  Soft palate rises symmetrically and there is no tongue deviation. Hearing is intact to conversational tone. Motor: Strength is 5/5 on the L but on the R there is 0/5 movement in the UE/LE with the exception of some shoulder movement on the right.   Movement examination: Tone: There is normal tone in the LUE.  On the right, there is loss of tone in the right upper and lower extremity. Abnormal movements: No tremor noted today Coordination:  There is mild decremation with RAM's, of the left.  This could not be tested on the right.     ASSESSMENT/PLAN:  1.  Idiopathic Parkinson's disease.  The patient has tremor, bradykinesia, rigidity and postural instability.  -We will continue with the carbidopa/levodopa 2 tablets at 7 AM/1-1/2 tablets at 11 AM/3 PM/ 7 PM and  carbidopa/levodopa 50/200 at bedtime.   -off of comtan and doing fine  -order ST today  -try atropine opthalmic 0.1% under tongue for sialorrhea.  If no help, myobloc.  Risks, benefits, side effects and alternative therapies were discussed.  The opportunity to ask questions was given and  they were answered to the best of my ability.  The patient expressed understanding and willingness to follow the outlined treatment protocols. 2. 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  -Understand risks 3.  Depression  -doing well with lexapro and will continue 4.   Allergic rhinitis  -zyrtec helped and should continue 5.  F/u 4-6 months.  Much greater than 50% of this visit was spent in counseling with the patient and the family.  Total face to face time:  30 min

## 2015-08-30 NOTE — Telephone Encounter (Signed)
Holly with assisted living called in regards to the medication that was prescribed/Dawn CB# [phone]

## 2015-08-30 NOTE — Telephone Encounter (Signed)
Clarified Atropine RX with [facility].

## 2015-08-30 NOTE — Patient Instructions (Signed)
1. Atropine RX order given.  2. Speech therapy order given to be performed at [facility].

## 2015-09-03 ENCOUNTER — Telehealth: Payer: Self-pay | Admitting: Neurology

## 2015-09-03 NOTE — Telephone Encounter (Signed)
Received request for alternative to Atropine for patient. Wrote a letter stating no available alternatives appropriate for patient and faxed to [facility] at [phone] with confirmation received.

## 2015-09-13 ENCOUNTER — Telehealth: Payer: Self-pay | Admitting: Neurology

## 2015-09-13 DIAGNOSIS — G20 Parkinson's disease: Secondary | ICD-10-CM

## 2015-09-13 MED ORDER — ATROPINE SULFATE 1 % OP SOLN: OPHTHALMIC | Status: DC

## 2015-09-13 NOTE — Telephone Encounter (Signed)
Patient's daughter made aware RX sent to [facility].

## 2015-09-13 NOTE — Telephone Encounter (Signed)
Pat. Daughter called and asked for the Atopine drops RX to be called or faxed To [facility] on [address].  And to please call her when done so she can go pick up.

## 2015-09-24 ENCOUNTER — Other Ambulatory Visit: Payer: Self-pay | Admitting: Urology

## 2015-09-24 DIAGNOSIS — N200 Calculus of kidney: Secondary | ICD-10-CM

## 2015-11-01 ENCOUNTER — Ambulatory Visit (HOSPITAL_COMMUNITY)
Admission: RE | Admit: 2015-11-01 | Discharge: 2015-11-01 | Disposition: A | Discharge status: Home / Self Care | Payer: Medicare Other | Source: Ambulatory Visit | Attending: Urology | Admitting: Urology

## 2015-11-01 DIAGNOSIS — N2889 Other specified disorders of kidney and ureter: Secondary | ICD-10-CM | POA: Insufficient documentation

## 2015-11-01 DIAGNOSIS — N200 Calculus of kidney: Secondary | ICD-10-CM

## 2015-11-29 ENCOUNTER — Encounter: Payer: Self-pay | Admitting: Neurology

## 2015-11-29 ENCOUNTER — Ambulatory Visit (INDEPENDENT_AMBULATORY_CARE_PROVIDER_SITE_OTHER): Payer: Medicare Other | Admitting: Neurology

## 2015-11-29 VITALS — BP 124/62 | HR 60

## 2015-11-29 DIAGNOSIS — G20 Parkinson's disease: Secondary | ICD-10-CM | POA: Diagnosis not present

## 2015-11-29 DIAGNOSIS — F330 Major depressive disorder, recurrent, mild: Secondary | ICD-10-CM

## 2015-11-29 DIAGNOSIS — K117 Disturbances of salivary secretion: Secondary | ICD-10-CM | POA: Diagnosis not present

## 2015-11-29 DIAGNOSIS — G8101 Flaccid hemiplegia affecting right dominant side: Secondary | ICD-10-CM | POA: Diagnosis not present

## 2015-11-29 DIAGNOSIS — G20A1 Parkinson's disease without dyskinesia, without mention of fluctuations: Secondary | ICD-10-CM

## 2015-11-29 NOTE — Progress Notes (Signed)
Eric Oneill was seen today in the movement disorders clinic for neurologic consultation at the request of Willodean Rosenthal, MD.  The consultation is for the evaluation of Parkinson's disease.  The patient previously saw Dr. [name].  I last visit with Dr. [name] was in June, 2013.  It appears that the patient was seen by Dr. [name] on a yearly basis.  I have a copy of the June, 2013 note from Dr. [name] and I did review this.  The pt supplies his own hx.    The first symptom(s) the patient noticed was tremor in the L hand.  He was dx with PD in 2008.    He was not initally placed on medication, but once he started on medication, it was carbidopa/levodopa.  He was initially on the 50/200 CR but that has been changed and he is now on the 25/100 IR three times per day before meals.  He is on selegeline once per day and does not know if it helps.  It appears from notes that he was on it twice per day in the past.    04/07/13 update:  This patient is accompanied in the office by his child who supplements the history.  Last visit, I increased the patient's carbidopa/levodopa 25/100-1-1/2 tablets 3 times per day.  We also added carbidopa/levodopa 50/200 CR at bedtime.  He does think that the increase helped his overall symptoms, but notices that he seems to have more trouble, especially with tremor, between 12 PM and 4 PM.  He generally takes his medication at 7 AM, noon and 5 PM.  He has not fallen.  There are no hallucinations.  His daughter prepares his pill box and then he takes the medications.  About 5 days ago, he decided to try and stop the lisinopril as he did not think he needed it any longer.  He has been watching his blood pressure closely and it has remained good.  He has been having some cramping at night.  Some of the cramping is in the feet and some inappropriate longus muscle on the right.  He has had some difficulty falling asleep at night, but his daughter also reports that the patient takes two, 2 hour naps.  He  also decreased his selegiline so he was only taking it one time per day, at 7 AM.  He continues to complain of back pain.  Since our last visit the patient did have an MRI of the brain.  There was dilated Virchow-Robins spaces in the right basal ganglia, but I think that there is potentially an old lacune in the left basal ganglia.  An MRI of the lumbar spine demonstrated moderate central canal stenosis at the L4-L5 level and bilateral neural foraminal stenosis at the L5-S1 level.  06/09/13 update:  The patient is accompanied by his daughter who supplements the history.  The patient has Parkinson's disease.  He is currently on carbidopa/levodopa 25/100, 1-1/2 tablets at 7 AM/11 AM/3 PM/6 PM.  This is an increase of one additional dose since our last visit.  He is also on carbidopa/levodopa 50/200 at bedtime.  He increased his selegeline back to bid.  He overall feel good but continues to complain of dizziness.  Last visit, he had self discontinued the lisinopril, but he went back on it.  He had one fall since our last visit, that occurred in the yard while pulling weeds.  He did not get hurt.  He has not had any nausea or vomiting  or hallucinations.  09/03/13 update:  The patient is accompanied by his daughter who supplements the history.  I reviewed his hospital records since our last visit.  He does have a history of Parkinson's disease and is supposed to be on carbidopa/levodopa 25/100, 1-1/2 tablets at 7 AM/11 AM/3 PM/7 PM and on carbidopa/levodopa 50/200 at bedtime.  However, after several calls to the ECF, it appears that the patient is getting carbidopa/levodopa 25/100, 2 tablets 4 times per day (no times marked on the sheet and it is not marked the patient was given the medication) and the carbidopa/levodopa 50/200 at bedtime has been discontinued for unknown reason.  Since our last visit, the patient was hospitalized.  This was on 06/25/2013.  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  I reviewed those films. He saw Dr. Tilden Dome and Dr. Jannifer Franklin in the hospital.  I reviewed those notes.  He had a PEG tube placed.  He was minimally verbal at discharge.  He is now at a SNF and is making a remarkable recovery.  He is only using PEG at night for supplemental feeds and has advanced to a mechanical soft diet.  His speech is improving as well.  His daughter expresses frustration as he was getting therapy 5 days per week and now his insurance is only allowing 3 days per week.  They are paying out of pocket to have it the other days.  The pt is now making some attempts at walking in the parallel bars.    09/22/13 update:  The pt is accompanied by his daughters who supplement the hx.  Pt has PD with a fairly recent left BG hemmorhage, resulting in the R hemiparesis.  He is on carbidopa/levodopa 25/100, 2 at 7am, 1.5 at 11 am, 2 at 3 pm, 1.5 at 7 pm.  He is on an additional carbidopa/levodopa CR 50/200 at night.  The pt definitely noted a positive difference when we adjusted the medication.  He is continuing with therapy, although they are paying for some of the therapy out of pocket.  They are not sure when he will be released from therapy but think that it will be a while.  No swallowing issues.  Facial paresthesias on the R.  Some food pocketing on the R.    01/12/14 update:  The pt is accompanied by his daughter who supplement the hx.  Pt has PD with a fairly recent left BG hemmorhage, resulting in the R hemiparesis.  He is on carbidopa/levodopa 25/100, 2 at 7am, 1.5 at 11 am, 2 at 3 pm, 1.5 at 7 pm.  He is on an additional carbidopa/levodopa CR 50/200 at night.   Pt had DVT since last visit and I thought that a [place] filter was placed but apparently the [facility] physician talked with neurologist in [place] and after CT brain was done and didn't show evidence of brain bleed, he was placed on eloquis.  Doesn't walk so no fall risk.  Some tremor in the L  hand over the last month.  This is bothering him especially when picking up his drink.  Meds given in applesauce but he doesn't bite them.  Looking into getting a motorized scooter but has a VF cut so ECF helping him with that issue before issuing him one.  Some recent dizziness that they don't think related to BP change.  Mood good on Lexapro and they would like me to take over prescribing that.  05/18/14 update:  The patient returns today for followup, accompanied by his daughter who supplements the history.  Last visit, we tried to increase his levodopa to 2 tablets 4 times per day, but this caused dizziness and just decreasing the dose barely to 2 in the morning, 1-1/2 at 11 AM, 2 at 3 PM and one and a half at 7 PM took away the dizziness.  He remains on carbidopa/levodopa 50/200 at night.  He is nearly one year out from his basal ganglia hemorrhage that has left him nearly paralyzed on the right side, and he subsequently had a DVT in the right leg.  He is now on Eliquis because of this.  There has been no melena or hematochezia.  The patient is actually returning home this year for Thanksgiving for the day and this will be a big milestone for him.  The patient is complaining about significant phlegm buildup and he does not think that this is coming from saliva, but rather from postnasal drip.  He has tried Flonase without success.  He does complaining about increasing tremor on the left hand side.  He also has had some difficulty with constipation, but now is on both MiraLax and Colace at the extended care facility.  10/19/14 update:  Pt returns for f/u, accompanied by his daughter who supplements the history.  Pt remains on carbidopa/levodopa 25/100 2 in the morning, 1-1/2 at 11 AM, 2 at 3 PM and one and a half at 7 PM.  He remains on carbidopa/levodopa 50/200 at night.  Despite having a basal ganglia hemmorhage, he is on eloquis for DVT and they know the risks of this.  He decided not to proceeded with  the carotid u/s last visit despite a hx of stenosis as he said that he would not want any intervention anyway.  Pt and daughter relay that speech is getting softer and the side opposite the stroke (the non plegic side) is getting weaker.  The zyrtec did help some of his secretions.  C/o some LUE tremor with eating but daughter states that dizziness in the afternoon continues to be issue after taking med.  Had labs since last visit.  Total chol was 127, TG 145, B12 570.    04/26/15 update:  The patient presents today, accompanied by his daughter who supplements the history.  Last visit, he was on carbidopa/levodopa 25/100, 2 tablets at 7 AM, 1-1/2 tablet at 11 AM, 2 tablets at 3 PM and one and a half tablets at 7 PM, in addition to carbidopa/levodopa 50/200 at night.  His daughter called me in May 20 now that he would become dizzy after the 3 PM dose and we slightly decreased the 3 PM dosage to carbidopa/levodopa 25/100,  1-1/2 tablets. He has had more tremor and continues to c/o dizziness.  He hates the tremor because spills coffee and has trouble eating with the only had that can move.   I did write an order last visit for physical, occupational and speech therapy at the extended care facility.    His daughter states that they only did speech but they refused to do PT and OT.  He is reporting that he has had 3 episodes of possible blood in his depends over the last 2 weeks.  He denies any melanoma or her medication.  08/30/15 update:  The patient follows up today, accompanied by his daughter who supplements the history.  He is on carbidopa/levodopa 25/100, 2 tablets at 7 AM, 1-1/2 tablets at  11 AM, 3 PM, 7 PM and carbidopa/levodopa 50/200 at night.  I discontinued his Comtan last visit.  This did not seem to make a huge difference and he is doing about the same.  He does have a history of depression and is on Lexapro for that.  I did have him attend physical therapy at the SNF since our last visit.  Helped but  daughter states that he needs ST, for speech and swallowing.  Has G tube but don't want to use or do MBE (understand consequences but think that this may be his last pleasure).  Drooling more.  Reports that he had hematuria and a 77mm kidney stone was found.  Taken off of eliquis for a while but put back on and recently had a little blood.  Has repeat scan upcoming in April.  They talked to him about lithotripsy but he would have to be off eliquis.    11/29/15 update:  The patient follows up today, accompanied by his daughter who supplements the history.  He is on carbidopa/levodopa 25/100, 2 tablets at 7 AM, 1-1/2 tablets at 11 AM, 3 PM, 7 PM and carbidopa/levodopa 50/200 at night.  I ordered speech therapy for him at the subacute nursing facility since last visit.  His daughter states that this was never done. I also started him on atropine drops for the sialorrhea.  They didn't ever use them and not suprisingly, he is still drooling.  Daughter asks about myobloc.  He remains on Lexapro for the depression.  That has been stable although he asks his daughter to step out of the room at the end of the visit and he asks me "how much longer."  He is so tired of living in this state.   PREVIOUS MEDICATIONS:  Azilect (no help); carbidopa/levodopa extended release in the past but was discontinued in favor of the IR; selegiline  ALLERGIES:   Allergies  Allergen Reactions  . Ivp Dye [Iodinated Diagnostic Agents] Other (See Comments)    Dizziness     CURRENT MEDICATIONS:    Medication List       This list is accurate as of: 11/29/15 11:01 AM.  Always use your most recent med list.               atropine 1 % ophthalmic solution  Use as directed     carbidopa-levodopa 25-100 MG tablet  Commonly known as:  SINEMET IR  Take by mouth. Take 2 tablets by mouth at 7 am, 1.5 tablets by mouth at 11 am, 2 tablets by mouth at 3 pm, 1.5 tablets by mouth at 7 pm     carbidopa-levodopa 50-200 MG tablet   Commonly known as:  SINEMET CR  Take 1 tablet by mouth at bedtime.     cetirizine 10 MG tablet  Commonly known as:  ZYRTEC ALLERGY  Take 1 tablet (10 mg total) by mouth daily.     docusate sodium 100 MG capsule  Commonly known as:  COLACE  Take 100 mg by mouth daily.     ELIQUIS 5 MG Tabs tablet  Generic drug:  apixaban  Take 5 mg by mouth 2 (two) times daily.     escitalopram 10 MG tablet  Commonly known as:  LEXAPRO  Take 1 tablet (10 mg total) by mouth daily.     hydroxypropyl methylcellulose / hypromellose 2.5 % ophthalmic solution  Commonly known as:  ISOPTO TEARS / GONIOVISC  1 drop.     ipratropium-albuterol 0.5-2.5 (3)  MG/3ML Soln  Commonly known as:  DUONEB  Take 3 mLs by nebulization.     omeprazole 10 MG capsule  Commonly known as:  PRILOSEC  20 mg by PEG Tube route daily.     polyethylene glycol packet  Commonly known as:  MIRALAX / GLYCOLAX  Take 17 g by mouth daily.     simvastatin 40 MG tablet  Commonly known as:  ZOCOR  Take 40 mg by mouth at bedtime.        PAST MEDICAL HISTORY:   Past Medical History  Diagnosis Date  . Coronary artery disease   . Parkinson disease ([place])     dx: 06/2007  . Depression   . Stroke [facility])     05-2013    PAST SURGICAL HISTORY:   Past Surgical History  Procedure Laterality Date  . Coronary artery bypass graft    . Hernia repair      x2  . Circumcision  Aug 2002  . Carpal tunnel release      L  . Knee surgery      bilateral  . Peg placement N/A 07/02/2013    Procedure: PERCUTANEOUS ENDOSCOPIC GASTROSTOMY (PEG) PLACEMENT;  Surgeon: Gwenyth Ober, MD;  Location: [place];  Service: General;  Laterality: N/A;    SOCIAL HISTORY:   Social History   Social History  . Marital Status: Widowed    Spouse Name: N/A  . Number of Children: N/A  . Years of Education: N/A   Occupational History  . retired     [employer]   Social History Main Topics  . Smoking status: Former Smoker    Quit date:  02/18/1958  . Smokeless tobacco: Never Used  . Alcohol Use: Yes     Comment: daily, glass wine daily  . Drug Use: No  . Sexual Activity: Not on file   Other Topics Concern  . Not on file   Social History Narrative    FAMILY HISTORY:   Family Status  Relation Status Death Age  . Mother Deceased 64    heart ds  . Father Deceased 35    CVA  . Brother      2, multiple myeloma; heart disease  . Sister      3, CAD, Breast CA    ROS:   A complete 10 system review of systems was obtained and was unremarkable apart from what is mentioned above.  PHYSICAL EXAMINATION:    VITALS:   Filed Vitals:   11/29/15 1055  BP: 124/62  Pulse: 60    GEN:  The patient appears stated age and is in NAD. HEENT:  Normocephalic, atraumatic.  The mucous membranes are very dry. The superficial temporal arteries are without ropiness or tenderness.  Holding drool cloth CV:  RRR Lungs:  CTAB Neck/HEME:  There are no carotid bruits bilaterally.  Neurological examination:  Orientation: The patient is alert and oriented x3. Fund of knowledge is appropriate.   Cranial nerves: There is R facial droop.    There is vertical, upbeat nystagmus.  The visual fields are full to confrontational testing. The speech is dysarthric but he was able to communicate in a meaningful, understandable way.  He has very little spontaneity of speech.  Soft palate rises symmetrically and there is no tongue deviation. Hearing is intact to conversational tone. Motor: Strength is 5/5 on the L but on the R there is 0/5 movement in the UE/LE with the exception of some shoulder movement on the  right.   Movement examination: Tone: There is normal tone in the LUE.  On the right, there is loss of tone in the right upper and lower extremity. Abnormal movements: No tremor noted today Coordination:  There is mild decremation with RAM's, of the left.  This could not be tested on the right.     ASSESSMENT/PLAN:  1.  Idiopathic  Parkinson's disease.  The patient has tremor, bradykinesia, rigidity and postural instability.  -We will continue with the carbidopa/levodopa 2 tablets at 7 AM/1-1/2 tablets at 11 AM/3 PM/ 7 PM and  carbidopa/levodopa 50/200 at bedtime.  Asked the nursing facility not to crush this.  If he cannot swallow it, he does have a PEG tube.  -off of comtan and doing fine  -ordered ST last visit but not done.  Will order again today and add PT  -will try to get prior auth for myobloc  -Daughter asks if I can change DuoNeb from when necessary to once per day plus the when necessary since the patient is complaining about some shortness of breath that is relieved with the DuoNeb.  I did this, but warned him that it may increase tremor, but we did at bedtime. 2. 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  -Understand risks 3.  Depression  -On Lexapro.  As above, he does ask me about when the and is near.  He is just tired of living in this state.  Unfortunately, he is not a candidate for hospice. 4.  Allergic rhinitis  -zyrtec helped and should continue 5.  F/u 4 months.  Much greater than 50% of this visit was spent in counseling with the patient and the family.  Total face to face time:  30 min

## 2015-11-30 ENCOUNTER — Ambulatory Visit: Payer: Medicare Other | Admitting: Neurology

## 2015-12-02 ENCOUNTER — Telehealth: Payer: Self-pay | Admitting: Neurology

## 2015-12-02 NOTE — Telephone Encounter (Signed)
Myobloc appt made with patient's daughter.

## 2015-12-10 ENCOUNTER — Ambulatory Visit (INDEPENDENT_AMBULATORY_CARE_PROVIDER_SITE_OTHER): Payer: Medicare Other | Admitting: Neurology

## 2015-12-10 DIAGNOSIS — K117 Disturbances of salivary secretion: Secondary | ICD-10-CM | POA: Diagnosis not present

## 2015-12-10 MED ORDER — RIMABOTULINUMTOXINB 5000 UNIT/ML IM SOLN
5000.0000 [IU] | Freq: Once | INTRAMUSCULAR | Status: AC
  Administered 2015-12-10: 5000 [IU] via INTRAMUSCULAR

## 2015-12-10 NOTE — Procedures (Signed)
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 Pt tolerated procedure well without complications.   Reinjection is anticipated in 3 months.   

## 2015-12-24 ENCOUNTER — Telehealth: Payer: Self-pay | Admitting: Neurology

## 2015-12-24 NOTE — Telephone Encounter (Signed)
Called and spoke with patient's daughter to see how patient did on Myobloc. She states he told her he has seen an improvement. They will call with any changes.

## 2016-01-08 ENCOUNTER — Emergency Department (HOSPITAL_COMMUNITY)
Admission: EM | Admit: 2016-01-08 | Discharge: 2016-01-09 | Disposition: A | Discharge status: Home / Self Care | Payer: Medicare Other | Attending: Emergency Medicine | Admitting: Emergency Medicine

## 2016-01-08 ENCOUNTER — Encounter (HOSPITAL_COMMUNITY): Payer: Self-pay

## 2016-01-08 ENCOUNTER — Emergency Department (HOSPITAL_COMMUNITY): Payer: Medicare Other

## 2016-01-08 DIAGNOSIS — Y929 Unspecified place or not applicable: Secondary | ICD-10-CM | POA: Diagnosis not present

## 2016-01-08 DIAGNOSIS — Z8673 Personal history of transient ischemic attack (TIA), and cerebral infarction without residual deficits: Secondary | ICD-10-CM | POA: Insufficient documentation

## 2016-01-08 DIAGNOSIS — Y999 Unspecified external cause status: Secondary | ICD-10-CM | POA: Diagnosis not present

## 2016-01-08 DIAGNOSIS — Y939 Activity, unspecified: Secondary | ICD-10-CM | POA: Diagnosis not present

## 2016-01-08 DIAGNOSIS — I2510 Atherosclerotic heart disease of native coronary artery without angina pectoris: Secondary | ICD-10-CM | POA: Insufficient documentation

## 2016-01-08 DIAGNOSIS — G20 Parkinson's disease: Secondary | ICD-10-CM | POA: Diagnosis not present

## 2016-01-08 DIAGNOSIS — Z955 Presence of coronary angioplasty implant and graft: Secondary | ICD-10-CM | POA: Diagnosis not present

## 2016-01-08 DIAGNOSIS — Z87891 Personal history of nicotine dependence: Secondary | ICD-10-CM | POA: Insufficient documentation

## 2016-01-08 DIAGNOSIS — T18128A Food in esophagus causing other injury, initial encounter: Secondary | ICD-10-CM | POA: Diagnosis not present

## 2016-01-08 DIAGNOSIS — Z79899 Other long term (current) drug therapy: Secondary | ICD-10-CM | POA: Diagnosis not present

## 2016-01-08 DIAGNOSIS — R0989 Other specified symptoms and signs involving the circulatory and respiratory systems: Secondary | ICD-10-CM | POA: Diagnosis present

## 2016-01-08 DIAGNOSIS — Z7901 Long term (current) use of anticoagulants: Secondary | ICD-10-CM | POA: Insufficient documentation

## 2016-01-08 DIAGNOSIS — X58XXXA Exposure to other specified factors, initial encounter: Secondary | ICD-10-CM | POA: Insufficient documentation

## 2016-01-08 LAB — I-STAT CHEM 8, ED
BUN: 16 mg/dL (ref 6–20)
CHLORIDE: 103 mmol/L (ref 101–111)
Calcium, Ion: 1.31 mmol/L — ABNORMAL HIGH (ref 1.13–1.30)
Creatinine, Ser: 1 mg/dL (ref 0.61–1.24)
GLUCOSE: 91 mg/dL (ref 65–99)
HEMATOCRIT: 30 % — AB (ref 39.0–52.0)
Hemoglobin: 10.2 g/dL — ABNORMAL LOW (ref 13.0–17.0)
POTASSIUM: 4.3 mmol/L (ref 3.5–5.1)
Sodium: 140 mmol/L (ref 135–145)
TCO2: 26 mmol/L (ref 0–100)

## 2016-01-08 MED ORDER — GLUCAGON HCL RDNA (DIAGNOSTIC) 1 MG IJ SOLR
1.0000 mg | Freq: Once | INTRAMUSCULAR | Status: AC
  Administered 2016-01-08: 1 mg via INTRAVENOUS
  Filled 2016-01-08: qty 1

## 2016-01-08 NOTE — ED Notes (Signed)
Per EMS- EMS called out for choking on a melon piece. Pt able to get most of it out himself. Airway now clear. Clear lung sounds. Phlegm in throat.

## 2016-01-08 NOTE — Discharge Instructions (Signed)
Do not eat or drink anything tonight. Try to drink liquids tomorrow morning. If you are having problems swallowing then call [facility]. Dr. Dulce Sellarutlaw is Dr. we spoke to him today.

## 2016-01-08 NOTE — ED Provider Notes (Signed)
CSN: [id number]     Arrival date & time 01/08/16  1902 History   First MD Initiated Contact with Patient 01/08/16 1912     Chief Complaint  Patient presents with  . Choking     (Consider location/radiation/quality/duration/timing/severity/associated sxs/prior Treatment) Patient is a 82 y.o. male presenting with pharyngitis. The history is provided by a relative (The patient choked on a Melin today. He is having some pain in his throat when he swallows).  Sore Throat This is a new problem. The current episode started 3 to 5 hours ago. The problem occurs constantly. The problem has not changed since onset.Pertinent negatives include no chest pain, no abdominal pain and no headaches. Nothing aggravates the symptoms. Nothing relieves the symptoms.    Past Medical History  Diagnosis Date  . Coronary artery disease   . Parkinson disease (HCC)     dx: 06/2007  . Depression   . Stroke [facility])     05-2013   Past Surgical History  Procedure Laterality Date  . Coronary artery bypass graft    . Hernia repair      x2  . Circumcision  Aug 2002  . Carpal tunnel release      L  . Knee surgery      bilateral  . Peg placement N/A 07/02/2013    Procedure: PERCUTANEOUS ENDOSCOPIC GASTROSTOMY (PEG) PLACEMENT;  Surgeon: Cherylynn Ridges, MD;  Location[facility] ENDOSCOPY;  Service: General;  Laterality: N/A;   Family History  Problem Relation Age of Onset  . Cancer Sister     breast  . Cancer Brother     myeloma  . Stroke Father    Social History  Substance Use Topics  . Smoking status: Former Smoker    Quit date: 02/18/1958  . Smokeless tobacco: Never Used  . Alcohol Use: Yes     Comment: daily, glass wine daily    Review of Systems  Constitutional: Negative for appetite change and fatigue.  HENT: Negative for congestion, ear discharge and sinus pressure.        Throat pain  Eyes: Negative for discharge.  Respiratory: Negative for cough.   Cardiovascular: Negative for chest pain.   Gastrointestinal: Negative for abdominal pain and diarrhea.  Genitourinary: Negative for frequency and hematuria.  Musculoskeletal: Negative for back pain.  Skin: Negative for rash.  Neurological: Negative for seizures and headaches.  Psychiatric/Behavioral: Negative for hallucinations.      Allergies  Ivp dye  Home Medications   Prior to Admission medications   Medication Sig Start Date End Date Taking? Authorizing Provider  acetaminophen (TYLENOL) 325 MG tablet Take 650 mg by mouth every 6 (six) hours as needed.    Historical Provider, MD  apixaban (ELIQUIS) 5 MG TABS tablet Take 5 mg by mouth 2 (two) times daily.    Historical Provider, MD  carbidopa-levodopa (SINEMET CR) 50-200 MG per tablet Take 1 tablet by mouth at bedtime.    Historical Provider, MD  carbidopa-levodopa (SINEMET IR) 25-100 MG per tablet Take by mouth. Take 2 tablets by mouth at 7 am, 1.5 tablets by mouth at 11 am, 2 tablets by mouth at 3 pm, 1.5 tablets by mouth at 7 pm    Historical Provider, MD  cetirizine (ZYRTEC ALLERGY) 10 MG tablet Take 1 tablet (10 mg total) by mouth daily. 05/18/14   Octaviano Batty Tat, DO  docusate sodium (COLACE) 100 MG capsule Take 100 mg by mouth daily.    Historical Provider, MD  escitalopram (LEXAPRO) 10 MG  tablet Take 1 tablet (10 mg total) by mouth daily. 06/09/13   Octaviano Battyebecca S Tat, DO  hydroxypropyl methylcellulose / hypromellose (ISOPTO TEARS / GONIOVISC) 2.5 % ophthalmic solution 1 drop.    Historical Provider, MD  ipratropium-albuterol (DUONEB) 0.5-2.5 (3) MG/3ML SOLN Take 3 mLs by nebulization.    Historical Provider, MD  omeprazole (PRILOSEC) 10 MG capsule 20 mg by PEG Tube route daily.    Historical Provider, MD  polyethylene glycol (MIRALAX / GLYCOLAX) packet Take 17 g by mouth daily.    Historical Provider, MD  simvastatin (ZOCOR) 40 MG tablet Take 40 mg by mouth at bedtime.      Historical Provider, MD   SpO2 98% Physical Exam  Constitutional: He is oriented to person,  place, and time. He appears well-developed.  HENT:  Head: Normocephalic.  Eyes: Conjunctivae and EOM are normal. No scleral icterus.  Neck: Neck supple. No thyromegaly present.  Cardiovascular: Normal rate and regular rhythm.  Exam reveals no gallop and no friction rub.   No murmur heard. Pulmonary/Chest: No stridor. He has no wheezes. He has no rales. He exhibits no tenderness.  Abdominal: He exhibits no distension. There is no tenderness. There is no rebound.  Musculoskeletal: Normal range of motion. He exhibits no edema.  Lymphadenopathy:    He has no cervical adenopathy.  Neurological: He is alert and oriented to person, place, and time. He exhibits normal muscle tone. Coordination normal.  Patient is a phasic from stroke  Skin: No rash noted. No erythema.    ED Course  Procedures (including critical care time) Labs Review Labs Reviewed  I-STAT CHEM 8, ED - Abnormal; Notable for the following:    Calcium, Ion 1.31 (*)    Hemoglobin 10.2 (*)    HCT 30.0 (*)    All other components within normal limits    Imaging Review Dg Chest Port 1 View  01/08/2016  CLINICAL DATA:  82 year old male with choking symptoms. EXAM: PORTABLE CHEST 1 VIEW COMPARISON:  Chest radiograph dated 06/28/2013 FINDINGS: Single portable view of the chest demonstrate clear lungs with sharp costophrenic angles. There is no pneumothorax. Stable cardiac silhouette. Median sternotomy wires and CABG vascular clips noted. No acute osseous pathology identified. IMPRESSION: No active disease. Electronically Signed   By: Elgie CollardArash  Radparvar M.D.   On: 01/08/2016 20:15   I have personally reviewed and evaluated these images and lab results as part of my medical decision-making.   EKG Interpretation None      MDM   Final diagnoses:  Food impaction of esophagus, initial encounter    Labs and x-ray were unremarkable. Patient was given glucagon. An hour later he tried to drink some water. He was able to swallow water  but it hurts significantly. And was very difficult to swallow. He was able swallow his own saliva. I spoke with Dr. Dulce Sellaroutlaw and he stated to have the patient and her family members call him in the morning if he still having problems swallowing water    -  Bethann BerkshireJoseph Jonna Dittrich, MD 01/08/16 2131

## 2016-01-08 NOTE — ED Notes (Signed)
Bed: [id number] Expected date:  Expected time:  Means of arrival:  Comments: EMS- 82 yo choked on melon; airway now clear

## 2016-03-10 ENCOUNTER — Ambulatory Visit: Payer: Medicare Other | Admitting: Neurology

## 2016-04-07 NOTE — Progress Notes (Signed)
Eric Oneill was seen today in the movement disorders clinic for neurologic consultation at the request of Willodean Rosenthal, MD.  The consultation is for the evaluation of Parkinson's disease.  The patient previously saw Dr. [name].  I last visit with Dr. [name] was in June, 2013.  It appears that the patient was seen by Dr. [name] on a yearly basis.  I have a copy of the June, 2013 note from Dr. [name] and I did review this.  The pt supplies his own hx.    The first symptom(s) the patient noticed was tremor in the L hand.  He was dx with PD in 2008.    He was not initally placed on medication, but once he started on medication, it was carbidopa/levodopa.  He was initially on the 50/200 CR but that has been changed and he is now on the 25/100 IR three times per day before meals.  He is on selegeline once per day and does not know if it helps.  It appears from notes that he was on it twice per day in the past.    04/07/13 update:  This patient is accompanied in the office by his child who supplements the history.  Last visit, I increased the patient's carbidopa/levodopa 25/100-1-1/2 tablets 3 times per day.  We also added carbidopa/levodopa 50/200 CR at bedtime.  He does think that the increase helped his overall symptoms, but notices that he seems to have more trouble, especially with tremor, between 12 PM and 4 PM.  He generally takes his medication at 7 AM, noon and 5 PM.  He has not fallen.  There are no hallucinations.  His daughter prepares his pill box and then he takes the medications.  About 5 days ago, he decided to try and stop the lisinopril as he did not think he needed it any longer.  He has been watching his blood pressure closely and it has remained good.  He has been having some cramping at night.  Some of the cramping is in the feet and some inappropriate longus muscle on the right.  He has had some difficulty falling asleep at night, but his daughter also reports that the patient takes two, 2 hour naps.  He  also decreased his selegiline so he was only taking it one time per day, at 7 AM.  He continues to complain of back pain.  Since our last visit the patient did have an MRI of the brain.  There was dilated Virchow-Robins spaces in the right basal ganglia, but I think that there is potentially an old lacune in the left basal ganglia.  An MRI of the lumbar spine demonstrated moderate central canal stenosis at the L4-L5 level and bilateral neural foraminal stenosis at the L5-S1 level.  06/09/13 update:  The patient is accompanied by his daughter who supplements the history.  The patient has Parkinson's disease.  He is currently on carbidopa/levodopa 25/100, 1-1/2 tablets at 7 AM/11 AM/3 PM/6 PM.  This is an increase of one additional dose since our last visit.  He is also on carbidopa/levodopa 50/200 at bedtime.  He increased his selegeline back to bid.  He overall feel good but continues to complain of dizziness.  Last visit, he had self discontinued the lisinopril, but he went back on it.  He had one fall since our last visit, that occurred in the yard while pulling weeds.  He did not get hurt.  He has not had any nausea or vomiting  or hallucinations.  09/03/13 update:  The patient is accompanied by his daughter who supplements the history.  I reviewed his hospital records since our last visit.  He does have a history of Parkinson's disease and is supposed to be on carbidopa/levodopa 25/100, 1-1/2 tablets at 7 AM/11 AM/3 PM/7 PM and on carbidopa/levodopa 50/200 at bedtime.  However, after several calls to the ECF, it appears that the patient is getting carbidopa/levodopa 25/100, 2 tablets 4 times per day (no times marked on the sheet and it is not marked the patient was given the medication) and the carbidopa/levodopa 50/200 at bedtime has been discontinued for unknown reason.  Since our last visit, the patient was hospitalized.  This was on 06/25/2013.  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  I reviewed those films. He saw Dr. Tilden Dome and Dr. Jannifer Franklin in the hospital.  I reviewed those notes.  He had a PEG tube placed.  He was minimally verbal at discharge.  He is now at a SNF and is making a remarkable recovery.  He is only using PEG at night for supplemental feeds and has advanced to a mechanical soft diet.  His speech is improving as well.  His daughter expresses frustration as he was getting therapy 5 days per week and now his insurance is only allowing 3 days per week.  They are paying out of pocket to have it the other days.  The pt is now making some attempts at walking in the parallel bars.    09/22/13 update:  The pt is accompanied by his daughters who supplement the hx.  Pt has PD with a fairly recent left BG hemmorhage, resulting in the R hemiparesis.  He is on carbidopa/levodopa 25/100, 2 at 7am, 1.5 at 11 am, 2 at 3 pm, 1.5 at 7 pm.  He is on an additional carbidopa/levodopa CR 50/200 at night.  The pt definitely noted a positive difference when we adjusted the medication.  He is continuing with therapy, although they are paying for some of the therapy out of pocket.  They are not sure when he will be released from therapy but think that it will be a while.  No swallowing issues.  Facial paresthesias on the R.  Some food pocketing on the R.    01/12/14 update:  The pt is accompanied by his daughter who supplement the hx.  Pt has PD with a fairly recent left BG hemmorhage, resulting in the R hemiparesis.  He is on carbidopa/levodopa 25/100, 2 at 7am, 1.5 at 11 am, 2 at 3 pm, 1.5 at 7 pm.  He is on an additional carbidopa/levodopa CR 50/200 at night.   Pt had DVT since last visit and I thought that a [place] filter was placed but apparently the [facility] physician talked with neurologist in [place] and after CT brain was done and didn't show evidence of brain bleed, he was placed on eloquis.  Doesn't walk so no fall risk.  Some tremor in the L  hand over the last month.  This is bothering him especially when picking up his drink.  Meds given in applesauce but he doesn't bite them.  Looking into getting a motorized scooter but has a VF cut so ECF helping him with that issue before issuing him one.  Some recent dizziness that they don't think related to BP change.  Mood good on Lexapro and they would like me to take over prescribing that.  05/18/14 update:  The patient returns today for followup, accompanied by his daughter who supplements the history.  Last visit, we tried to increase his levodopa to 2 tablets 4 times per day, but this caused dizziness and just decreasing the dose barely to 2 in the morning, 1-1/2 at 11 AM, 2 at 3 PM and one and a half at 7 PM took away the dizziness.  He remains on carbidopa/levodopa 50/200 at night.  He is nearly one year out from his basal ganglia hemorrhage that has left him nearly paralyzed on the right side, and he subsequently had a DVT in the right leg.  He is now on Eliquis because of this.  There has been no melena or hematochezia.  The patient is actually returning home this year for Thanksgiving for the day and this will be a big milestone for him.  The patient is complaining about significant phlegm buildup and he does not think that this is coming from saliva, but rather from postnasal drip.  He has tried Flonase without success.  He does complaining about increasing tremor on the left hand side.  He also has had some difficulty with constipation, but now is on both MiraLax and Colace at the extended care facility.  10/19/14 update:  Pt returns for f/u, accompanied by his daughter who supplements the history.  Pt remains on carbidopa/levodopa 25/100 2 in the morning, 1-1/2 at 11 AM, 2 at 3 PM and one and a half at 7 PM.  He remains on carbidopa/levodopa 50/200 at night.  Despite having a basal ganglia hemmorhage, he is on eloquis for DVT and they know the risks of this.  He decided not to proceeded with  the carotid u/s last visit despite a hx of stenosis as he said that he would not want any intervention anyway.  Pt and daughter relay that speech is getting softer and the side opposite the stroke (the non plegic side) is getting weaker.  The zyrtec did help some of his secretions.  C/o some LUE tremor with eating but daughter states that dizziness in the afternoon continues to be issue after taking med.  Had labs since last visit.  Total chol was 127, TG 145, B12 570.    04/26/15 update:  The patient presents today, accompanied by his daughter who supplements the history.  Last visit, he was on carbidopa/levodopa 25/100, 2 tablets at 7 AM, 1-1/2 tablet at 11 AM, 2 tablets at 3 PM and one and a half tablets at 7 PM, in addition to carbidopa/levodopa 50/200 at night.  His daughter called me in May 20 now that he would become dizzy after the 3 PM dose and we slightly decreased the 3 PM dosage to carbidopa/levodopa 25/100,  1-1/2 tablets. He has had more tremor and continues to c/o dizziness.  He hates the tremor because spills coffee and has trouble eating with the only had that can move.   I did write an order last visit for physical, occupational and speech therapy at the extended care facility.    His daughter states that they only did speech but they refused to do PT and OT.  He is reporting that he has had 3 episodes of possible blood in his depends over the last 2 weeks.  He denies any melanoma or her medication.  08/30/15 update:  The patient follows up today, accompanied by his daughter who supplements the history.  He is on carbidopa/levodopa 25/100, 2 tablets at 7 AM, 1-1/2 tablets at  11 AM, 3 PM, 7 PM and carbidopa/levodopa 50/200 at night.  I discontinued his Comtan last visit.  This did not seem to make a huge difference and he is doing about the same.  He does have a history of depression and is on Lexapro for that.  I did have him attend physical therapy at the SNF since our last visit.  Helped but  daughter states that he needs ST, for speech and swallowing.  Has G tube but don't want to use or do MBE (understand consequences but think that this may be his last pleasure).  Drooling more.  Reports that he had hematuria and a 30m kidney stone was found.  Taken off of eliquis for a while but put back on and recently had a little blood.  Has repeat scan upcoming in April.  They talked to him about lithotripsy but he would have to be off eliquis.    11/29/15 update:  The patient follows up today, accompanied by his daughter who supplements the history.  He is on carbidopa/levodopa 25/100, 2 tablets at 7 AM, 1-1/2 tablets at 11 AM, 3 PM, 7 PM and carbidopa/levodopa 50/200 at night.  I ordered speech therapy for him at the subacute nursing facility since last visit.  His daughter states that this was never done. I also started him on atropine drops for the sialorrhea.  They didn't ever use them and not suprisingly, he is still drooling.  Daughter asks about myobloc.  He remains on Lexapro for the depression.  That has been stable although he asks his daughter to step out of the room at the end of the visit and he asks me "how much longer."  He is so tired of living in this state.  04/07/16 update:  The patient follows up today, accompanied by his daughter who supplements the history.  He is on carbidopa/levodopa 25/100, 2 tablets at 7 AM, 1-1/2 tablets at 3pm (looks like the 7pm dose was replaced with CR q hs) and carbidopa/levodopa 50/200 at night.   Daughter states that occasionally has tremor but not often.  Still notes some dizziness after 3pm dose of levodopa.    Decided to hold off on myobloc for sialorrhea as not that effective.  Have atropine gtts but SNF won't administer.  Still on lexapro for the depression.  Not sure if helping mood.  Off of eloquis because of anemia and SNF sent 12/2015 CBC that said that hemoglobin had stabilized and hemoglobin on that report was 9.0 .  That was last time it was drawn.   Daughter states that pt had hematuria because of large kidney stone (not painful) and that was source of blood loss.  Once eloquis d/c, they aren't seeing the hematuria.  Pt is very tired, worse today per daughter.  Is participating some with activities at SNF but not as much as was.  Eyes are very dry and painful.  OTC drops not helping.   PREVIOUS MEDICATIONS:  Azilect (no help); carbidopa/levodopa extended release in the past but was discontinued in favor of the IR; selegiline  ALLERGIES:   Allergies  Allergen Reactions  . Ivp Dye [Iodinated Diagnostic Agents] Other (See Comments)    Dizziness     CURRENT MEDICATIONS:    Medication List       Accurate as of 04/10/16 11:35 AM. Always use your most recent med list.          carbidopa-levodopa 25-100 MG tablet Commonly known as:  SINEMET IR Take  by mouth. Take 2 tablets by mouth at 7 am, 1.5 tablets by mouth at 11 am, 1.5 tablets by mouth at 3 pm, 1.5 tablets by mouth at 7 pm   carbidopa-levodopa 50-200 MG tablet Commonly known as:  SINEMET CR Take 1 tablet by mouth at bedtime.   cetirizine 10 MG tablet Commonly known as:  ZYRTEC ALLERGY Take 1 tablet (10 mg total) by mouth daily.   docusate sodium 100 MG capsule Commonly known as:  COLACE Take 100 mg by mouth daily.   escitalopram 10 MG tablet Commonly known as:  LEXAPRO Take 1 tablet (10 mg total) by mouth daily.   hydroxypropyl methylcellulose / hypromellose 2.5 % ophthalmic solution Commonly known as:  ISOPTO TEARS / GONIOVISC 1 drop.   ipratropium-albuterol 0.5-2.5 (3) MG/3ML Soln Commonly known as:  DUONEB Take 3 mLs by nebulization.   omeprazole 10 MG capsule Commonly known as:  PRILOSEC 20 mg by PEG Tube route daily.   polyethylene glycol packet Commonly known as:  MIRALAX / GLYCOLAX Take 17 g by mouth daily.   simvastatin 40 MG tablet Commonly known as:  ZOCOR Take 40 mg by mouth at bedtime.       PAST MEDICAL HISTORY:   Past Medical History:   Diagnosis Date  . Coronary artery disease   . Depression   . Parkinson disease ([place])    dx: 06/2007  . Stroke [facility])    05-2013    PAST SURGICAL HISTORY:   Past Surgical History:  Procedure Laterality Date  . CARPAL TUNNEL RELEASE     L  . CIRCUMCISION  Aug 2002  . CORONARY ARTERY BYPASS GRAFT    . HERNIA REPAIR     x2  . KNEE SURGERY     bilateral  . PEG PLACEMENT N/A 07/02/2013   Procedure: PERCUTANEOUS ENDOSCOPIC GASTROSTOMY (PEG) PLACEMENT;  Surgeon: Gwenyth Ober, MD;  Location: [place];  Service: General;  Laterality: N/A;    SOCIAL HISTORY:   Social History   Social History  . Marital status: Widowed    Spouse name: N/A  . Number of children: N/A  . Years of education: N/A   Occupational History  . retired     [employer]   Social History Main Topics  . Smoking status: Former Smoker    Quit date: 02/18/1958  . Smokeless tobacco: Never Used  . Alcohol use Yes     Comment: daily, glass wine daily  . Drug use: No  . Sexual activity: Not on file   Other Topics Concern  . Not on file   Social History Narrative  . No narrative on file    FAMILY HISTORY:   Family Status  Relation Status  . Mother Deceased at age 85   heart ds  . Father Deceased at age 23   CVA  . Brother    2, multiple myeloma; heart disease  . Sister    3, CAD, Breast CA    ROS:   A complete 10 system review of systems was obtained and was unremarkable apart from what is mentioned above.  PHYSICAL EXAMINATION:    VITALS:   Vitals:   04/10/16 1053  BP: 100/60  BP Location: Left Arm  Patient Position: Sitting  Cuff Size: Normal  Pulse: 67    GEN:  The patient appears stated age and is in NAD. HEENT:  Normocephalic, atraumatic.  The mucous membranes are very dry. The superficial temporal arteries are without ropiness or tenderness.  Holding drool cloth CV:  RRR Lungs:  CTAB Neck/HEME:  There are no carotid bruits bilaterally.  Neurological  examination:  Orientation: The patient is alert and oriented x3. Fund of knowledge is appropriate.   Cranial nerves: There is R facial droop.    There is vertical, upbeat nystagmus.  The visual fields are full to confrontational testing. The speech is dysarthric but he was able to communicate in some but chooses not to communicate much.  He has very little spontaneity of speech.  Soft palate rises symmetrically and there is no tongue deviation. Hearing is intact to conversational tone. Motor: Strength is 5/5 on the L but on the R there is 0/5 movement in the UE/LE with the exception of some shoulder movement on the right.   Movement examination: Tone: There is normal tone in the LUE.  On the right, there is loss of tone in the right upper and lower extremity. Abnormal movements: No tremor noted today Coordination:  There is mild decremation with RAM's, of the left.  This could not be tested on the right.     ASSESSMENT/PLAN:  1.  Idiopathic Parkinson's disease.  The patient has tremor, bradykinesia, rigidity and postural instability.  -We will continue with the carbidopa/levodopa 2 tablets at 7 AM but I held the 3 pm dosage to see if helps dizziness.  Not sure how much needs medication now.  Will continue  carbidopa/levodopa 50/200 at bedtime, which is about 7pm  -off of comtan and doing fine 2. 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  -off of eloquis because of anemia/hematuria from kidney stone  -recheck cbc today as more somnolent than in the past and skin more pale. 3.  Depression  -On Lexapro.  As above, he does ask me about when the and is near.  He is just tired of living in this state.  Unfortunately, he is not a candidate for hospice. 4.  Dry eye  -told daughter to call Dr. Prudencio Burly.  Is very painful for the patient and OTC drops/tears not helping. 5.  Going to make him prn.  More trouble getting him out and it exhausts him.  Much greater than  50% of this visit was spent in counseling and coordinating care.  Total face to face time:  25 min

## 2016-04-10 ENCOUNTER — Ambulatory Visit (INDEPENDENT_AMBULATORY_CARE_PROVIDER_SITE_OTHER): Payer: Medicare Other | Admitting: Neurology

## 2016-04-10 ENCOUNTER — Encounter: Payer: Self-pay | Admitting: Neurology

## 2016-04-10 VITALS — BP 100/60 | HR 67

## 2016-04-10 DIAGNOSIS — D62 Acute posthemorrhagic anemia: Secondary | ICD-10-CM

## 2016-04-10 DIAGNOSIS — G8101 Flaccid hemiplegia affecting right dominant side: Secondary | ICD-10-CM | POA: Diagnosis not present

## 2016-04-10 DIAGNOSIS — G20 Parkinson's disease: Secondary | ICD-10-CM

## 2016-04-10 DIAGNOSIS — G20A1 Parkinson's disease without dyskinesia, without mention of fluctuations: Secondary | ICD-10-CM

## 2016-04-13 ENCOUNTER — Telehealth: Payer: Self-pay | Admitting: Neurology

## 2016-04-13 NOTE — Telephone Encounter (Signed)
Let daughter know that he is still anemic but is stable and even little better I think.  His HgB was 9.1

## 2016-04-13 NOTE — Telephone Encounter (Signed)
Patients daughter made aware

## 2016-04-17 ENCOUNTER — Telehealth: Payer: Self-pay | Admitting: Neurology

## 2016-04-17 NOTE — Telephone Encounter (Signed)
sure

## 2016-04-17 NOTE — Telephone Encounter (Signed)
Eric Oneill 2028-01-07. His daughter Darel HongJudy called in regarding his parkinson's medication that he takes at 3:00 everyday. Dr. Arbutus Leasat had him to stop that one. He feels that he needs to start it again. He is a resident at [facility]. His daughter would like to see if that order can be reinstated for him to start that medication again. The charge nurses # is [phone]. Thank you

## 2016-04-17 NOTE — Telephone Encounter (Signed)
Called and spoke with daughter. She states her dad is feeling horrible- tremors are worse without 3 pm Levodopa. Dizziness did not get any better. Okay to add medication back? Please advise.

## 2016-04-18 NOTE — Telephone Encounter (Signed)
Order faxed to [facility] for Carbidopa Levodopa to [phone] with confirmation received. Spoke with Olegario MessierKathy at [facility] to okay the addition of the 3 pm dose back.

## 2016-04-19 ENCOUNTER — Other Ambulatory Visit: Payer: Self-pay | Admitting: Urology

## 2016-04-19 DIAGNOSIS — N200 Calculus of kidney: Secondary | ICD-10-CM

## 2016-05-08 ENCOUNTER — Ambulatory Visit (HOSPITAL_COMMUNITY)
Admission: RE | Admit: 2016-05-08 | Discharge: 2016-05-08 | Disposition: A | Discharge status: Home / Self Care | Payer: Medicare Other | Source: Ambulatory Visit | Attending: Urology | Admitting: Urology

## 2016-05-08 DIAGNOSIS — N200 Calculus of kidney: Secondary | ICD-10-CM | POA: Diagnosis present

## 2016-05-08 DIAGNOSIS — M479 Spondylosis, unspecified: Secondary | ICD-10-CM | POA: Insufficient documentation

## 2016-05-11 ENCOUNTER — Telehealth: Payer: Self-pay | Admitting: Neurology

## 2016-05-11 NOTE — Telephone Encounter (Signed)
Patient daughter sandy wants to talk to someone about coming back in for a injection please call [phone]

## 2016-05-11 NOTE — Telephone Encounter (Signed)
Patient's daughter states that now that Myobloc as worn completely off they are cognizant of how helpful it seemed to be. They would like to r/s Myobloc injections. She can only come in on Mondays. Next Available Monday 06/19/16 - appt scheduled with daughter.

## 2016-06-19 ENCOUNTER — Ambulatory Visit (INDEPENDENT_AMBULATORY_CARE_PROVIDER_SITE_OTHER): Payer: Medicare Other | Admitting: Neurology

## 2016-06-19 DIAGNOSIS — K117 Disturbances of salivary secretion: Secondary | ICD-10-CM

## 2016-06-19 MED ORDER — RIMABOTULINUMTOXINB 5000 UNIT/ML IM SOLN
5000.0000 [IU] | Freq: Once | INTRAMUSCULAR | Status: AC
  Administered 2016-06-19: 5000 [IU] via INTRAMUSCULAR

## 2016-06-19 NOTE — Procedures (Signed)
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  Pt thought it wasn't helping but now with copius drooling and wants to proceed with injection.     Injections  Location Left  Right Units Number of sites        Parotid 2500 2500 5000 1 per side  TOTAL UNITS:   5000    Type of Toxin: Myobloc type B As ordered and injected IM at today's visit Total Units: 5000  Discarded Units: 0  Needle drawback with each injection was free of blood. Pt tolerated procedure well without complications.   Reinjection is anticipated in 3 months.

## 2016-10-16 ENCOUNTER — Other Ambulatory Visit (INDEPENDENT_AMBULATORY_CARE_PROVIDER_SITE_OTHER): Payer: Medicare Other

## 2016-10-16 ENCOUNTER — Ambulatory Visit (INDEPENDENT_AMBULATORY_CARE_PROVIDER_SITE_OTHER): Payer: Medicare Other | Admitting: Neurology

## 2016-10-16 ENCOUNTER — Encounter: Payer: Self-pay | Admitting: Neurology

## 2016-10-16 VITALS — BP 120/64 | HR 68 | Temp 98.5°F

## 2016-10-16 DIAGNOSIS — K117 Disturbances of salivary secretion: Secondary | ICD-10-CM | POA: Diagnosis not present

## 2016-10-16 DIAGNOSIS — G20 Parkinson's disease: Secondary | ICD-10-CM

## 2016-10-16 DIAGNOSIS — R063 Periodic breathing: Secondary | ICD-10-CM | POA: Diagnosis not present

## 2016-10-16 LAB — CBC
HEMATOCRIT: 31.5 % — AB (ref 39.0–52.0)
Hemoglobin: 9.7 g/dL — ABNORMAL LOW (ref 13.0–17.0)
MCHC: 30.9 g/dL (ref 30.0–36.0)
MCV: 69.7 fl — ABNORMAL LOW (ref 78.0–100.0)
Platelets: 415 10*3/uL — ABNORMAL HIGH (ref 150.0–400.0)
RBC: 4.52 Mil/uL (ref 4.22–5.81)
RDW: 19.5 % — ABNORMAL HIGH (ref 11.5–15.5)
WBC: 11.5 10*3/uL — ABNORMAL HIGH (ref 4.0–10.5)

## 2016-10-16 LAB — COMPREHENSIVE METABOLIC PANEL
ALBUMIN: 3.2 g/dL — AB (ref 3.5–5.2)
ALT: 6 U/L (ref 0–53)
AST: 24 U/L (ref 0–37)
Alkaline Phosphatase: 99 U/L (ref 39–117)
BUN: 36 mg/dL — ABNORMAL HIGH (ref 6–23)
CALCIUM: 10.1 mg/dL (ref 8.4–10.5)
CHLORIDE: 106 meq/L (ref 96–112)
CO2: 27 mEq/L (ref 19–32)
Creatinine, Ser: 1.02 mg/dL (ref 0.40–1.50)
GFR: 73.56 mL/min (ref >60.00)
Glucose, Bld: 100 mg/dL — ABNORMAL HIGH (ref 70–99)
POTASSIUM: 4.2 meq/L (ref 3.5–5.1)
SODIUM: 140 meq/L (ref 135–145)
Total Bilirubin: 0.5 mg/dL (ref 0.2–1.2)
Total Protein: 7.8 g/dL (ref 6.0–8.3)

## 2016-10-16 LAB — SEDIMENTATION RATE: SED RATE: 95 mm/h — AB (ref 0–20)

## 2016-10-16 MED ORDER — RIMABOTULINUMTOXINB 5000 UNIT/ML IM SOLN
5000.0000 [IU] | Freq: Once | INTRAMUSCULAR | Status: AC
  Administered 2016-10-16: 5000 [IU] via INTRAMUSCULAR

## 2016-10-16 NOTE — Procedures (Signed)
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 Pt tolerated procedure well without complications.   Reinjection is anticipated in 3 months.   

## 2016-10-16 NOTE — Progress Notes (Signed)
Eric Oneill was seen today in the movement disorders clinic for neurologic consultation at the request of Willodean Rosenthal, MD.  The consultation is for the evaluation of Parkinson's disease.  The patient previously saw Dr. [name].  I last visit with Dr. [name] was in June, 2013.  It appears that the patient was seen by Dr. [name] on a yearly basis.  I have a copy of the June, 2013 note from Dr. [name] and I did review this.  The pt supplies his own hx.    The first symptom(s) the patient noticed was tremor in the L hand.  He was dx with PD in 2008.    He was not initally placed on medication, but once he started on medication, it was carbidopa/levodopa.  He was initially on the 50/200 CR but that has been changed and he is now on the 25/100 IR three times per day before meals.  He is on selegeline once per day and does not know if it helps.  It appears from notes that he was on it twice per day in the past.    04/07/13 update:  This patient is accompanied in the office by his child who supplements the history.  Last visit, I increased the patient's carbidopa/levodopa 25/100-1-1/2 tablets 3 times per day.  We also added carbidopa/levodopa 50/200 CR at bedtime.  He does think that the increase helped his overall symptoms, but notices that he seems to have more trouble, especially with tremor, between 12 PM and 4 PM.  He generally takes his medication at 7 AM, noon and 5 PM.  He has not fallen.  There are no hallucinations.  His daughter prepares his pill box and then he takes the medications.  About 5 days ago, he decided to try and stop the lisinopril as he did not think he needed it any longer.  He has been watching his blood pressure closely and it has remained good.  He has been having some cramping at night.  Some of the cramping is in the feet and some inappropriate longus muscle on the right.  He has had some difficulty falling asleep at night, but his daughter also reports that the patient takes two, 2 hour naps.  He  also decreased his selegiline so he was only taking it one time per day, at 7 AM.  He continues to complain of back pain.  Since our last visit the patient did have an MRI of the brain.  There was dilated Virchow-Robins spaces in the right basal ganglia, but I think that there is potentially an old lacune in the left basal ganglia.  An MRI of the lumbar spine demonstrated moderate central canal stenosis at the L4-L5 level and bilateral neural foraminal stenosis at the L5-S1 level.  06/09/13 update:  The patient is accompanied by his daughter who supplements the history.  The patient has Parkinson's disease.  He is currently on carbidopa/levodopa 25/100, 1-1/2 tablets at 7 AM/11 AM/3 PM/6 PM.  This is an increase of one additional dose since our last visit.  He is also on carbidopa/levodopa 50/200 at bedtime.  He increased his selegeline back to bid.  He overall feel good but continues to complain of dizziness.  Last visit, he had self discontinued the lisinopril, but he went back on it.  He had one fall since our last visit, that occurred in the yard while pulling weeds.  He did not get hurt.  He has not had any nausea or vomiting  or hallucinations.  09/03/13 update:  The patient is accompanied by his daughter who supplements the history.  I reviewed his hospital records since our last visit.  He does have a history of Parkinson's disease and is supposed to be on carbidopa/levodopa 25/100, 1-1/2 tablets at 7 AM/11 AM/3 PM/7 PM and on carbidopa/levodopa 50/200 at bedtime.  However, after several calls to the ECF, it appears that the patient is getting carbidopa/levodopa 25/100, 2 tablets 4 times per day (no times marked on the sheet and it is not marked the patient was given the medication) and the carbidopa/levodopa 50/200 at bedtime has been discontinued for unknown reason.  Since our last visit, the patient was hospitalized.  This was on 06/25/2013.  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  I reviewed those films. He saw Dr. Tilden Dome and Dr. Jannifer Franklin in the hospital.  I reviewed those notes.  He had a PEG tube placed.  He was minimally verbal at discharge.  He is now at a SNF and is making a remarkable recovery.  He is only using PEG at night for supplemental feeds and has advanced to a mechanical soft diet.  His speech is improving as well.  His daughter expresses frustration as he was getting therapy 5 days per week and now his insurance is only allowing 3 days per week.  They are paying out of pocket to have it the other days.  The pt is now making some attempts at walking in the parallel bars.    09/22/13 update:  The pt is accompanied by his daughters who supplement the hx.  Pt has PD with a fairly recent left BG hemmorhage, resulting in the R hemiparesis.  He is on carbidopa/levodopa 25/100, 2 at 7am, 1.5 at 11 am, 2 at 3 pm, 1.5 at 7 pm.  He is on an additional carbidopa/levodopa CR 50/200 at night.  The pt definitely noted a positive difference when we adjusted the medication.  He is continuing with therapy, although they are paying for some of the therapy out of pocket.  They are not sure when he will be released from therapy but think that it will be a while.  No swallowing issues.  Facial paresthesias on the R.  Some food pocketing on the R.    01/12/14 update:  The pt is accompanied by his daughter who supplement the hx.  Pt has PD with a fairly recent left BG hemmorhage, resulting in the R hemiparesis.  He is on carbidopa/levodopa 25/100, 2 at 7am, 1.5 at 11 am, 2 at 3 pm, 1.5 at 7 pm.  He is on an additional carbidopa/levodopa CR 50/200 at night.   Pt had DVT since last visit and I thought that a [place] filter was placed but apparently the [facility] physician talked with neurologist in [place] and after CT brain was done and didn't show evidence of brain bleed, he was placed on eloquis.  Doesn't walk so no fall risk.  Some tremor in the L  hand over the last month.  This is bothering him especially when picking up his drink.  Meds given in applesauce but he doesn't bite them.  Looking into getting a motorized scooter but has a VF cut so ECF helping him with that issue before issuing him one.  Some recent dizziness that they don't think related to BP change.  Mood good on Lexapro and they would like me to take over prescribing that.  05/18/14 update:  The patient returns today for followup, accompanied by his daughter who supplements the history.  Last visit, we tried to increase his levodopa to 2 tablets 4 times per day, but this caused dizziness and just decreasing the dose barely to 2 in the morning, 1-1/2 at 11 AM, 2 at 3 PM and one and a half at 7 PM took away the dizziness.  He remains on carbidopa/levodopa 50/200 at night.  He is nearly one year out from his basal ganglia hemorrhage that has left him nearly paralyzed on the right side, and he subsequently had a DVT in the right leg.  He is now on Eliquis because of this.  There has been no melena or hematochezia.  The patient is actually returning home this year for Thanksgiving for the day and this will be a big milestone for him.  The patient is complaining about significant phlegm buildup and he does not think that this is coming from saliva, but rather from postnasal drip.  He has tried Flonase without success.  He does complaining about increasing tremor on the left hand side.  He also has had some difficulty with constipation, but now is on both MiraLax and Colace at the extended care facility.  10/19/14 update:  Pt returns for f/u, accompanied by his daughter who supplements the history.  Pt remains on carbidopa/levodopa 25/100 2 in the morning, 1-1/2 at 11 AM, 2 at 3 PM and one and a half at 7 PM.  He remains on carbidopa/levodopa 50/200 at night.  Despite having a basal ganglia hemmorhage, he is on eloquis for DVT and they know the risks of this.  He decided not to proceeded with  the carotid u/s last visit despite a hx of stenosis as he said that he would not want any intervention anyway.  Pt and daughter relay that speech is getting softer and the side opposite the stroke (the non plegic side) is getting weaker.  The zyrtec did help some of his secretions.  C/o some LUE tremor with eating but daughter states that dizziness in the afternoon continues to be issue after taking med.  Had labs since last visit.  Total chol was 127, TG 145, B12 570.    04/26/15 update:  The patient presents today, accompanied by his daughter who supplements the history.  Last visit, he was on carbidopa/levodopa 25/100, 2 tablets at 7 AM, 1-1/2 tablet at 11 AM, 2 tablets at 3 PM and one and a half tablets at 7 PM, in addition to carbidopa/levodopa 50/200 at night.  His daughter called me in May 20 now that he would become dizzy after the 3 PM dose and we slightly decreased the 3 PM dosage to carbidopa/levodopa 25/100,  1-1/2 tablets. He has had more tremor and continues to c/o dizziness.  He hates the tremor because spills coffee and has trouble eating with the only had that can move.   I did write an order last visit for physical, occupational and speech therapy at the extended care facility.    His daughter states that they only did speech but they refused to do PT and OT.  He is reporting that he has had 3 episodes of possible blood in his depends over the last 2 weeks.  He denies any melanoma or her medication.  08/30/15 update:  The patient follows up today, accompanied by his daughter who supplements the history.  He is on carbidopa/levodopa 25/100, 2 tablets at 7 AM, 1-1/2 tablets at  11 AM, 3 PM, 7 PM and carbidopa/levodopa 50/200 at night.  I discontinued his Comtan last visit.  This did not seem to make a huge difference and he is doing about the same.  He does have a history of depression and is on Lexapro for that.  I did have him attend physical therapy at the SNF since our last visit.  Helped but  daughter states that he needs ST, for speech and swallowing.  Has G tube but don't want to use or do MBE (understand consequences but think that this may be his last pleasure).  Drooling more.  Reports that he had hematuria and a 17m kidney stone was found.  Taken off of eliquis for a while but put back on and recently had a little blood.  Has repeat scan upcoming in April.  They talked to him about lithotripsy but he would have to be off eliquis.    11/29/15 update:  The patient follows up today, accompanied by his daughter who supplements the history.  He is on carbidopa/levodopa 25/100, 2 tablets at 7 AM, 1-1/2 tablets at 11 AM, 3 PM, 7 PM and carbidopa/levodopa 50/200 at night.  I ordered speech therapy for him at the subacute nursing facility since last visit.  His daughter states that this was never done. I also started him on atropine drops for the sialorrhea.  They didn't ever use them and not suprisingly, he is still drooling.  Daughter asks about myobloc.  He remains on Lexapro for the depression.  That has been stable although he asks his daughter to step out of the room at the end of the visit and he asks me "how much longer."  He is so tired of living in this state.  04/07/16 update:  The patient follows up today, accompanied by his daughter who supplements the history.  He is on carbidopa/levodopa 25/100, 2 tablets at 7 AM, 1-1/2 tablets at 3pm (looks like the 7pm dose was replaced with CR q hs) and carbidopa/levodopa 50/200 at night.   Daughter states that occasionally has tremor but not often.  Still notes some dizziness after 3pm dose of levodopa.    Decided to hold off on myobloc for sialorrhea as not that effective.  Have atropine gtts but SNF won't administer.  Still on lexapro for the depression.  Not sure if helping mood.  Off of eloquis because of anemia and SNF sent 12/2015 CBC that said that hemoglobin had stabilized and hemoglobin on that report was 9.0 .  That was last time it was drawn.   Daughter states that pt had hematuria because of large kidney stone (not painful) and that was source of blood loss.  Once eloquis d/c, they aren't seeing the hematuria.  Pt is very tired, worse today per daughter.  Is participating some with activities at SNF but not as much as was.  Eyes are very dry and painful.  OTC drops not helping.  10/16/16 update:  Pt f/u with daughter, who supplements the history.  He is on carbidopa/levodopa 25/100, 2 tablets at 7 AM, 1-1/2 tablets at 11 AM, 3 PM, 7 PM and carbidopa/levodopa 50/200 at night. On remeron 15 mg q hs.  Was sick last week and had n/v and turns out had kidney stone.  He has been "worn out" since then.  He has changed himself to pureed food because of coughing and daughter asks about ST eval for this.  Drooling has not been as bad.  PREVIOUS MEDICATIONS:  Azilect (no help); carbidopa/levodopa extended release in the past but was discontinued in favor of the IR; selegiline  ALLERGIES:   Allergies  Allergen Reactions  . Ivp Dye [Iodinated Diagnostic Agents] Other (See Comments)    Dizziness     CURRENT MEDICATIONS:  Allergies as of 10/16/2016      Reactions   Ivp Dye [iodinated Diagnostic Agents] Other (See Comments)   Dizziness      Medication List       Accurate as of 10/16/16 11:25 AM. Always use your most recent med list.          acetaminophen 325 MG tablet Commonly known as:  TYLENOL Take 650 mg by mouth every 6 (six) hours as needed.   carbidopa-levodopa 25-100 MG tablet Commonly known as:  SINEMET IR Take by mouth. Take 2 tablets by mouth at 7 am, 1.5 tablets by mouth at 11 am, 1.5 tablets by mouth at 3 pm, 1.5 tablets by mouth at 7 pm   carbidopa-levodopa 50-200 MG tablet Commonly known as:  SINEMET CR Take 1 tablet by mouth at bedtime.   cetirizine 10 MG tablet Commonly known as:  ZYRTEC ALLERGY Take 1 tablet (10 mg total) by mouth daily.   dextromethorphan-guaiFENesin 30-600 MG 12hr tablet Commonly known  as:  MUCINEX DM Take 1 tablet by mouth 2 (two) times daily.   docusate sodium 100 MG capsule Commonly known as:  COLACE Take 100 mg by mouth daily.   escitalopram 10 MG tablet Commonly known as:  LEXAPRO Take 1 tablet (10 mg total) by mouth daily.   hydroxypropyl methylcellulose / hypromellose 2.5 % ophthalmic solution Commonly known as:  ISOPTO TEARS / GONIOVISC 1 drop.   ipratropium-albuterol 0.5-2.5 (3) MG/3ML Soln Commonly known as:  DUONEB Take 3 mLs by nebulization.   mirtazapine 15 MG tablet Commonly known as:  REMERON Take 15 mg by mouth at bedtime.   montelukast 10 MG tablet Commonly known as:  SINGULAIR Take 10 mg by mouth at bedtime.   omeprazole 10 MG capsule Commonly known as:  PRILOSEC 20 mg by PEG Tube route daily.   polyethylene glycol packet Commonly known as:  MIRALAX / GLYCOLAX Take 17 g by mouth daily.   simvastatin 40 MG tablet Commonly known as:  ZOCOR Take 40 mg by mouth at bedtime.       PAST MEDICAL HISTORY:   Past Medical History:  Diagnosis Date  . Coronary artery disease   . Depression   . Parkinson disease ([place])    dx: 06/2007  . Stroke [facility])    05-2013    PAST SURGICAL HISTORY:   Past Surgical History:  Procedure Laterality Date  . CARPAL TUNNEL RELEASE     L  . CIRCUMCISION  Aug 2002  . CORONARY ARTERY BYPASS GRAFT    . HERNIA REPAIR     x2  . KNEE SURGERY     bilateral  . PEG PLACEMENT N/A 07/02/2013   Procedure: PERCUTANEOUS ENDOSCOPIC GASTROSTOMY (PEG) PLACEMENT;  Surgeon: Gwenyth Ober, MD;  Location: [facility];  Service: General;  Laterality: N/A;    SOCIAL HISTORY:   Social History   Social History  . Marital status: Widowed    Spouse name: N/A  . Number of children: N/A  . Years of education: N/A   Occupational History  . retired     [employer]   Social History Main Topics  . Smoking status: Former Smoker    Quit date: 02/18/1958  .  Smokeless tobacco: Never Used  . Alcohol use Yes     Comment:  daily, glass wine daily  . Drug use: No  . Sexual activity: Not on file   Other Topics Concern  . Not on file   Social History Narrative  . No narrative on file    FAMILY HISTORY:   Family Status  Relation Status  . Mother Deceased at age 52   heart ds  . Father Deceased at age 16   CVA  . Brother    2, multiple myeloma; heart disease  . Sister    3, CAD, Breast CA    ROS:   A complete 10 system review of systems was obtained and was unremarkable apart from what is mentioned above.  PHYSICAL EXAMINATION:    VITALS:   Vitals:   10/16/16 1108  BP: 120/64  Pulse: 68  SpO2: 97%    GEN:  The patient appears stated age and is in NAD. HEENT:  Normocephalic, atraumatic.  The mucous membranes are very dry. The superficial temporal arteries are without ropiness or tenderness.  Holding drool cloth CV:  RRR Lungs:  CTAB but with cheyne stokes breathing throughout entire visit Neck/HEME:  There are no carotid bruits bilaterally.  Neurological examination:  Orientation: The patient is alert and oriented x3. Fund of knowledge is appropriate.   Cranial nerves: There is R facial droop. The speech is dysarthric but he was able to communicate some but chooses not to communicate much.  He has very little spontaneity of speech.  Soft palate rises symmetrically and there is no tongue deviation. Hearing is intact to conversational tone. Motor: Strength is 5/5 on the L but on the R there is 0/5 movement in the UE/LE with the exception of some shoulder movement on the right.   Movement examination: Tone: There is normal tone in the LUE.  On the right, there is loss of tone in the right upper and lower extremity. Abnormal movements: No tremor noted today Coordination:  There is mild decremation with RAM's, of the left.  This could not be tested on the right.      ASSESSMENT/PLAN:  1.  Idiopathic Parkinson's disease.  The patient has tremor, bradykinesia, rigidity and postural  instability.  -We will continue with the carbidopa/levodopa 2 tablets at 7 AM, 1-1/2 tablets at 11 AM, 3 PM, 7 PM and carbidopa/levodopa 50/200 at night    -off of comtan and doing fine 2. 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  -off of eloquis because of anemia/hematuria from kidney stone 3.  Cheyne stokes respiration  -will do labs.  Ask PCP about this.  Pt doesn't look well today 4.  Going to make him prn.  More trouble getting him out and it exhausts him.  Much greater than 50% of this visit was spent in counseling and coordinating care.  Total face to face time:  25 min

## 2016-10-17 ENCOUNTER — Telehealth: Payer: Self-pay | Admitting: *Deleted

## 2016-10-17 NOTE — Telephone Encounter (Signed)
Thanks. Please put orders in our system to reflect DNR status.

## 2016-10-17 NOTE — Addendum Note (Signed)
Addended bySilvio Pate: Krystel Fletchall L on: 10/17/2016 10:42 AM   Modules accepted: Orders

## 2016-10-17 NOTE — Telephone Encounter (Signed)
-----   Message from Octaviano Battyebecca S Tat, DO sent at 10/17/2016  7:40 AM EDT ----- Please FAX a copy of these labs to [facility] this AM.  Also call daughter and let her know that white count just slightly up.  Indicator of inflammation (sed rate) which is nonspecific is significantly elevated.  Mildly anemic but has been.  Would recommend CXR and repeat UA but will leave that to his medical doctors.  What are his wishes for level of care (they are not in our chart - full code, DNR, etc)?

## 2016-10-17 NOTE — Telephone Encounter (Signed)
Patient's daughter has been notified of results and recommendations. She also said that her dad is a DNR.  Faxed results and orders to Arrie SenateMelanie Walker, RN at [facility].  Fax: [phone]

## 2017-01-23 ENCOUNTER — Encounter (HOSPITAL_COMMUNITY): Payer: Self-pay

## 2017-01-23 ENCOUNTER — Inpatient Hospital Stay (HOSPITAL_COMMUNITY)
Admission: EM | Admit: 2017-01-23 | Discharge: 2017-01-25 | DRG: 871 | Disposition: A | Discharge status: Hospice | Payer: Medicare Other | Attending: Internal Medicine | Admitting: Internal Medicine

## 2017-01-23 ENCOUNTER — Emergency Department (HOSPITAL_COMMUNITY): Payer: Medicare Other

## 2017-01-23 DIAGNOSIS — Z823 Family history of stroke: Secondary | ICD-10-CM

## 2017-01-23 DIAGNOSIS — I69351 Hemiplegia and hemiparesis following cerebral infarction affecting right dominant side: Secondary | ICD-10-CM

## 2017-01-23 DIAGNOSIS — E785 Hyperlipidemia, unspecified: Secondary | ICD-10-CM | POA: Diagnosis present

## 2017-01-23 DIAGNOSIS — I4891 Unspecified atrial fibrillation: Secondary | ICD-10-CM | POA: Diagnosis present

## 2017-01-23 DIAGNOSIS — Z66 Do not resuscitate: Secondary | ICD-10-CM | POA: Diagnosis present

## 2017-01-23 DIAGNOSIS — Z6822 Body mass index (BMI) 22.0-22.9, adult: Secondary | ICD-10-CM

## 2017-01-23 DIAGNOSIS — Z7189 Other specified counseling: Secondary | ICD-10-CM

## 2017-01-23 DIAGNOSIS — A419 Sepsis, unspecified organism: Secondary | ICD-10-CM | POA: Diagnosis present

## 2017-01-23 DIAGNOSIS — J690 Pneumonitis due to inhalation of food and vomit: Secondary | ICD-10-CM | POA: Diagnosis present

## 2017-01-23 DIAGNOSIS — Z7401 Bed confinement status: Secondary | ICD-10-CM

## 2017-01-23 DIAGNOSIS — J9601 Acute respiratory failure with hypoxia: Secondary | ICD-10-CM | POA: Diagnosis present

## 2017-01-23 DIAGNOSIS — R1310 Dysphagia, unspecified: Secondary | ICD-10-CM | POA: Diagnosis present

## 2017-01-23 DIAGNOSIS — E43 Unspecified severe protein-calorie malnutrition: Secondary | ICD-10-CM | POA: Diagnosis present

## 2017-01-23 DIAGNOSIS — I10 Essential (primary) hypertension: Secondary | ICD-10-CM | POA: Diagnosis present

## 2017-01-23 DIAGNOSIS — I2510 Atherosclerotic heart disease of native coronary artery without angina pectoris: Secondary | ICD-10-CM | POA: Diagnosis present

## 2017-01-23 DIAGNOSIS — L89151 Pressure ulcer of sacral region, stage 1: Secondary | ICD-10-CM | POA: Diagnosis not present

## 2017-01-23 DIAGNOSIS — Z87891 Personal history of nicotine dependence: Secondary | ICD-10-CM

## 2017-01-23 DIAGNOSIS — N029 Recurrent and persistent hematuria with unspecified morphologic changes: Secondary | ICD-10-CM | POA: Diagnosis present

## 2017-01-23 DIAGNOSIS — G20 Parkinson's disease: Secondary | ICD-10-CM | POA: Diagnosis present

## 2017-01-23 DIAGNOSIS — R8271 Bacteriuria: Secondary | ICD-10-CM | POA: Diagnosis present

## 2017-01-23 DIAGNOSIS — I69391 Dysphagia following cerebral infarction: Secondary | ICD-10-CM | POA: Diagnosis not present

## 2017-01-23 DIAGNOSIS — J189 Pneumonia, unspecified organism: Secondary | ICD-10-CM | POA: Diagnosis present

## 2017-01-23 DIAGNOSIS — R0603 Acute respiratory distress: Secondary | ICD-10-CM

## 2017-01-23 DIAGNOSIS — Z951 Presence of aortocoronary bypass graft: Secondary | ICD-10-CM

## 2017-01-23 DIAGNOSIS — R8281 Pyuria: Secondary | ICD-10-CM

## 2017-01-23 DIAGNOSIS — R509 Fever, unspecified: Secondary | ICD-10-CM | POA: Diagnosis present

## 2017-01-23 DIAGNOSIS — Z807 Family history of other malignant neoplasms of lymphoid, hematopoietic and related tissues: Secondary | ICD-10-CM

## 2017-01-23 DIAGNOSIS — N390 Urinary tract infection, site not specified: Secondary | ICD-10-CM | POA: Diagnosis present

## 2017-01-23 DIAGNOSIS — I69322 Dysarthria following cerebral infarction: Secondary | ICD-10-CM

## 2017-01-23 DIAGNOSIS — Z515 Encounter for palliative care: Secondary | ICD-10-CM | POA: Diagnosis present

## 2017-01-23 DIAGNOSIS — L8990 Pressure ulcer of unspecified site, unspecified stage: Secondary | ICD-10-CM | POA: Insufficient documentation

## 2017-01-23 LAB — COMPREHENSIVE METABOLIC PANEL
AST: 25 U/L (ref 15–41)
Albumin: 2.7 g/dL — ABNORMAL LOW (ref 3.5–5.0)
Alkaline Phosphatase: 101 U/L (ref 38–126)
Anion gap: 10 (ref 5–15)
BILIRUBIN TOTAL: 0.9 mg/dL (ref 0.3–1.2)
BUN: 31 mg/dL — AB (ref 6–20)
CHLORIDE: 106 mmol/L (ref 101–111)
CO2: 21 mmol/L — ABNORMAL LOW (ref 22–32)
CREATININE: 1.22 mg/dL (ref 0.61–1.24)
Calcium: 9.6 mg/dL (ref 8.9–10.3)
GFR, EST NON AFRICAN AMERICAN: 52 mL/min — AB (ref >60)
Glucose, Bld: 137 mg/dL — ABNORMAL HIGH (ref 65–99)
POTASSIUM: 4.2 mmol/L (ref 3.5–5.1)
Sodium: 137 mmol/L (ref 135–145)
TOTAL PROTEIN: 7.6 g/dL (ref 6.5–8.1)

## 2017-01-23 LAB — URINALYSIS, MICROSCOPIC (REFLEX)

## 2017-01-23 LAB — URINALYSIS, ROUTINE W REFLEX MICROSCOPIC
BILIRUBIN URINE: NEGATIVE
Glucose, UA: NEGATIVE mg/dL
KETONES UR: 15 mg/dL — AB
NITRITE: NEGATIVE
PH: 5.5 (ref 5.0–8.0)
PROTEIN: 30 mg/dL — AB
Specific Gravity, Urine: >1.03 — ABNORMAL HIGH (ref 1.005–1.030)

## 2017-01-23 LAB — CBC WITH DIFFERENTIAL/PLATELET
BASOS ABS: 0 10*3/uL (ref 0.0–0.1)
Basophils Relative: 0 %
EOS PCT: 0 %
Eosinophils Absolute: 0 10*3/uL (ref 0.0–0.7)
HCT: 33.2 % — ABNORMAL LOW (ref 39.0–52.0)
Hemoglobin: 10 g/dL — ABNORMAL LOW (ref 13.0–17.0)
LYMPHS ABS: 1.2 10*3/uL (ref 0.7–4.0)
Lymphocytes Relative: 6 %
MCH: 21.6 pg — ABNORMAL LOW (ref 26.0–34.0)
MCHC: 30.1 g/dL (ref 30.0–36.0)
MCV: 71.6 fL — ABNORMAL LOW (ref 78.0–100.0)
MONO ABS: 1.2 10*3/uL — AB (ref 0.1–1.0)
Monocytes Relative: 6 %
NEUTROS PCT: 88 %
Neutro Abs: 18.2 10*3/uL — ABNORMAL HIGH (ref 1.7–7.7)
PLATELETS: 556 10*3/uL — AB (ref 150–400)
RBC: 4.64 MIL/uL (ref 4.22–5.81)
RDW: 19.8 % — AB (ref 11.5–15.5)
WBC: 20.6 10*3/uL — AB (ref 4.0–10.5)

## 2017-01-23 LAB — I-STAT CG4 LACTIC ACID, ED
LACTIC ACID, VENOUS: 2.97 mmol/L — AB (ref 0.5–1.9)
LACTIC ACID, VENOUS: 1.6 mmol/L (ref 0.5–1.9)

## 2017-01-23 LAB — I-STAT TROPONIN, ED: TROPONIN I, POC: 0.02 ng/mL (ref 0.00–0.08)

## 2017-01-23 MED ORDER — CARBIDOPA-LEVODOPA 25-100 MG PO TABS
2.0000 | ORAL_TABLET | ORAL | Status: DC
  Administered 2017-01-24 – 2017-01-25 (×2): 2 via ORAL
  Filled 2017-01-23 (×4): qty 2

## 2017-01-23 MED ORDER — PIPERACILLIN-TAZOBACTAM 3.375 G IVPB
3.3750 g | Freq: Three times a day (TID) | INTRAVENOUS | Status: DC
  Administered 2017-01-24 – 2017-01-25 (×4): 3.375 g via INTRAVENOUS
  Filled 2017-01-23 (×5): qty 50

## 2017-01-23 MED ORDER — POLYVINYL ALCOHOL 1.4 % OP SOLN
1.0000 [drp] | Freq: Three times a day (TID) | OPHTHALMIC | Status: DC | PRN
  Filled 2017-01-23: qty 15

## 2017-01-23 MED ORDER — ACETAMINOPHEN 325 MG PO TABS: 650.0000 mg | ORAL_TABLET | Freq: Four times a day (QID) | ORAL | Status: DC | PRN

## 2017-01-23 MED ORDER — PIPERACILLIN-TAZOBACTAM 3.375 G IVPB 30 MIN: 3.3750 g | Freq: Three times a day (TID) | INTRAVENOUS | Status: DC

## 2017-01-23 MED ORDER — HYDROCODONE-ACETAMINOPHEN 5-325 MG PO TABS: 1.0000 | ORAL_TABLET | ORAL | Status: DC | PRN

## 2017-01-23 MED ORDER — MONTELUKAST SODIUM 10 MG PO TABS
10.0000 mg | ORAL_TABLET | Freq: Every day | ORAL | Status: DC
  Administered 2017-01-23 – 2017-01-24 (×2): 10 mg via ORAL
  Filled 2017-01-23 (×2): qty 1

## 2017-01-23 MED ORDER — CARBIDOPA-LEVODOPA ER 50-200 MG PO TBCR
1.0000 | EXTENDED_RELEASE_TABLET | Freq: Every day | ORAL | Status: DC
  Filled 2017-01-23 (×3): qty 1

## 2017-01-23 MED ORDER — VANCOMYCIN HCL 10 G IV SOLR
1500.0000 mg | Freq: Once | INTRAVENOUS | Status: AC
  Administered 2017-01-23: 1500 mg via INTRAVENOUS
  Filled 2017-01-23: qty 1500

## 2017-01-23 MED ORDER — HYPROMELLOSE (GONIOSCOPIC) 2.5 % OP SOLN: 1.0000 [drp] | Freq: Three times a day (TID) | OPHTHALMIC | Status: DC | PRN

## 2017-01-23 MED ORDER — ACETAMINOPHEN 650 MG RE SUPP: 650.0000 mg | Freq: Four times a day (QID) | RECTAL | Status: DC | PRN

## 2017-01-23 MED ORDER — ONDANSETRON HCL 4 MG PO TABS: 4.0000 mg | ORAL_TABLET | Freq: Four times a day (QID) | ORAL | Status: DC | PRN

## 2017-01-23 MED ORDER — SODIUM CHLORIDE 0.9% FLUSH
3.0000 mL | Freq: Two times a day (BID) | INTRAVENOUS | Status: DC
  Administered 2017-01-23 – 2017-01-25 (×3): 3 mL via INTRAVENOUS

## 2017-01-23 MED ORDER — ALBUTEROL SULFATE (2.5 MG/3ML) 0.083% IN NEBU: 2.5000 mg | INHALATION_SOLUTION | RESPIRATORY_TRACT | Status: DC | PRN

## 2017-01-23 MED ORDER — MIRTAZAPINE 15 MG PO TABS
15.0000 mg | ORAL_TABLET | Freq: Every day | ORAL | Status: DC
  Administered 2017-01-23 – 2017-01-24 (×2): 15 mg via ORAL
  Filled 2017-01-23 (×2): qty 1

## 2017-01-23 MED ORDER — SODIUM CHLORIDE 0.9 % IV BOLUS (SEPSIS)
1000.0000 mL | Freq: Once | INTRAVENOUS | Status: AC
  Administered 2017-01-23: 1000 mL via INTRAVENOUS

## 2017-01-23 MED ORDER — ONDANSETRON HCL 4 MG/2ML IJ SOLN: 4.0000 mg | Freq: Four times a day (QID) | INTRAMUSCULAR | Status: DC | PRN

## 2017-01-23 MED ORDER — VANCOMYCIN HCL IN DEXTROSE 750-5 MG/150ML-% IV SOLN
750.0000 mg | Freq: Two times a day (BID) | INTRAVENOUS | Status: DC
  Administered 2017-01-24 – 2017-01-25 (×3): 750 mg via INTRAVENOUS
  Filled 2017-01-23 (×3): qty 150

## 2017-01-23 MED ORDER — IPRATROPIUM-ALBUTEROL 0.5-2.5 (3) MG/3ML IN SOLN
3.0000 mL | Freq: Four times a day (QID) | RESPIRATORY_TRACT | Status: DC
  Administered 2017-01-24 (×2): 3 mL via RESPIRATORY_TRACT
  Filled 2017-01-23 (×3): qty 3

## 2017-01-23 MED ORDER — PIPERACILLIN-TAZOBACTAM 3.375 G IVPB 30 MIN
3.3750 g | Freq: Once | INTRAVENOUS | Status: AC
  Administered 2017-01-23: 3.375 g via INTRAVENOUS
  Filled 2017-01-23: qty 50

## 2017-01-23 MED ORDER — ACETAMINOPHEN 650 MG RE SUPP
650.0000 mg | Freq: Once | RECTAL | Status: AC
  Administered 2017-01-23: 650 mg via RECTAL
  Filled 2017-01-23: qty 1

## 2017-01-23 MED ORDER — CARBIDOPA-LEVODOPA 25-100 MG PO TABS: 1.5000 | ORAL_TABLET | Freq: Three times a day (TID) | ORAL | Status: DC

## 2017-01-23 MED ORDER — SODIUM CHLORIDE 0.9 % IV SOLN
INTRAVENOUS | Status: AC
  Administered 2017-01-23: 23:00:00 via INTRAVENOUS

## 2017-01-23 MED ORDER — PIPERACILLIN-TAZOBACTAM IN DEX 2-0.25 GM/50ML IV SOLN: 2.2500 g | Freq: Once | INTRAVENOUS | Status: DC

## 2017-01-23 MED ORDER — CARBIDOPA-LEVODOPA 25-100 MG PO TABS
1.5000 | ORAL_TABLET | Freq: Three times a day (TID) | ORAL | Status: DC
  Administered 2017-01-24 – 2017-01-25 (×4): 1.5 via ORAL
  Filled 2017-01-23 (×11): qty 1.5

## 2017-01-23 MED ORDER — GUAIFENESIN ER 600 MG PO TB12
600.0000 mg | ORAL_TABLET | Freq: Two times a day (BID) | ORAL | Status: DC
  Filled 2017-01-23: qty 1

## 2017-01-23 NOTE — ED Notes (Signed)
Family at bedside. 

## 2017-01-23 NOTE — ED Notes (Signed)
Pt's daughter requested that all of pt's dailys meds be given through his peg tube.

## 2017-01-23 NOTE — ED Provider Notes (Signed)
[facility] DEPT Provider Note   CSN: [id number] Arrival date & time: 01/23/17  1627     History   Chief Complaint Chief Complaint  Patient presents with  . Respiratory Distress    HPI Eric Oneill is a 83 y.o. male.  The history is provided by the EMS personnel and medical records.  Shortness of Breath  This is a recurrent problem. The problem occurs continuously.The current episode started 3 to 5 hours ago. The problem has not changed since onset.Treatments tried: CPAP. Associated medical issues include CAD.    Past Medical History:  Diagnosis Date  . Coronary artery disease   . Depression   . Parkinson disease (HCC)    dx: 06/2007  . Stroke [facility])    05-2013    Patient Active Problem List   Diagnosis Date Noted  . Pressure injury of skin 01/24/2017  . Sepsis (HCC) 01/23/2017  . CAD (coronary artery disease) 01/23/2017  . HCAP (healthcare-associated pneumonia) 01/23/2017  . Acute respiratory failure with hypoxia (HCC) 01/23/2017  . Aspiration pneumonia (HCC) 01/23/2017  . Bacteriuria with pyuria 01/23/2017  . Hemiparesis affecting right side as late effect of cerebrovascular accident (HCC) 09/22/2013  . Basal Ganglia hemmorhage 09/03/2013  . Accelerated hypertension 07/03/2013  . Dyslipidemia, goal LDL below 100 07/03/2013  . Encounter for long-term (current) use of medications 07/03/2013  . Hypertension 06/25/2013  . PD (Parkinson's disease) (HCC) 02/18/2013    Past Surgical History:  Procedure Laterality Date  . CARPAL TUNNEL RELEASE     L  . CIRCUMCISION  Aug 2002  . CORONARY ARTERY BYPASS GRAFT    . HERNIA REPAIR     x2  . KNEE SURGERY     bilateral  . PEG PLACEMENT N/A 07/02/2013   Procedure: PERCUTANEOUS ENDOSCOPIC GASTROSTOMY (PEG) PLACEMENT;  Surgeon: Cherylynn Ridges, MD;  Location: [facility];  Service: General;  Laterality: N/A;       Home Medications    Prior to Admission medications   Medication Sig Start Date End Date Taking?  Authorizing Provider  acetaminophen (TYLENOL) 325 MG tablet Take 650 mg by mouth every 6 (six) hours as needed for fever.    Yes [provider]  Artificial Tear Ointment (AKWA TEARS) 09-14-81 % OINT Place 1 application into both eyes 2 (two) times daily.   Yes [provider]  carbidopa-levodopa (SINEMET CR) 50-200 MG per tablet Take 1 tablet by mouth at bedtime.   Yes [provider]  carbidopa-levodopa (SINEMET IR) 25-100 MG per tablet Take 1.5-2 tablets by mouth See admin instructions. Take 2 tablets by mouth at 7 am, 1.5 tablets by mouth at 11 am, 1.5 tablets by mouth at 3 pm, 1.5 tablets by mouth at 7 pm   Yes [provider]  cetirizine (ZYRTEC ALLERGY) 10 MG tablet Take 1 tablet (10 mg total) by mouth daily. 05/18/14  Yes Tat, Octaviano Batty, DO  docusate (COLACE) 50 MG/5ML liquid Take 100 mg by mouth every other day.   Yes [provider]  escitalopram (LEXAPRO) 10 MG tablet Take 1 tablet (10 mg total) by mouth daily. 06/09/13  Yes Tat, Octaviano Batty, DO  guaiFENesin (MUCINEX) 600 MG 12 hr tablet Take 600 mg by mouth 2 (two) times daily.   Yes [provider]  guaifenesin (ROBITUSSIN) 100 MG/5ML syrup Take 200 mg by mouth every 4 (four) hours as needed for cough or congestion.   Yes [provider]  Hypromellose (ARTIFICIAL TEARS OP) Place 1 drop into both  eyes 4 (four) times daily.   Yes [provider]  ipratropium-albuterol (DUONEB) 0.5-2.5 (3) MG/3ML SOLN Take 3 mLs by nebulization See admin instructions. Use 1 vial via nebulizer at bedtime. Use 1 vial via nebulizer every 6 hours as needed for shortness of breath or wheezing   Yes [provider]  mirtazapine (REMERON) 15 MG tablet Take 15 mg by mouth at bedtime.   Yes [provider]  montelukast (SINGULAIR) 10 MG tablet Take 10 mg by mouth daily.    Yes [provider]  polyethylene glycol (MIRALAX / GLYCOLAX) packet Take 17 g by mouth daily.   Yes  [provider]  tamsulosin (FLOMAX) 0.4 MG CAPS capsule Take 0.4 mg by mouth at bedtime.   Yes [provider]    Family History Family History  Problem Relation Age of Onset  . Stroke Father   . Cancer Sister        breast  . Cancer Brother        myeloma    Social History Social History  Substance Use Topics  . Smoking status: Former Smoker    Quit date: 02/18/1958  . Smokeless tobacco: Never Used  . Alcohol use Yes     Comment: daily, glass wine daily     Allergies   Ativan [lorazepam] and Ivp dye [iodinated diagnostic agents]   Review of Systems Review of Systems  Unable to perform ROS: Severe respiratory distress  Respiratory: Positive for shortness of breath.      Physical Exam Updated Vital Signs BP (!) 116/45 (BP Location: Left Arm)   Pulse 70   Temp 98.7 F (37.1 C) (Oral)   Resp (!) 23   Ht 5\' 9"  (1.753 m)   Wt 69.9 kg (154 lb 1.6 oz)   SpO2 99%   BMI 22.76 kg/m   Physical Exam  Constitutional: He appears well-developed.  HENT:  Head: Normocephalic and atraumatic.  Eyes: EOM are normal.  Neck: Neck supple.  Cardiovascular: Regular rhythm.  Tachycardia present.   No murmur heard. Pulmonary/Chest: He is in respiratory distress. He has no wheezes. He has rales.  Abdominal: Soft. There is no tenderness.  Musculoskeletal: He exhibits no edema.  Neurological: He is alert. No cranial nerve deficit. Coordination normal.  RUE with flexion contractures. Follows commands with LUE  Skin: Skin is warm and dry.  Nursing note and vitals reviewed.    ED Treatments / Results  Labs (all labs ordered are listed, but only abnormal results are displayed) Labs Reviewed  MRSA PCR SCREENING - Abnormal; Notable for the following:       Result Value   MRSA by PCR POSITIVE (*)    All other components within normal limits  BLOOD CULTURE ID PANEL (REFLEXED) - Abnormal; Notable for the following:    Staphylococcus species DETECTED (*)    All  other components within normal limits  COMPREHENSIVE METABOLIC PANEL - Abnormal; Notable for the following:    CO2 21 (*)    Glucose, Bld 137 (*)    BUN 31 (*)    Albumin 2.7 (*)    ALT <5 (*)    GFR calc non Af Amer 52 (*)    All other components within normal limits  CBC WITH DIFFERENTIAL/PLATELET - Abnormal; Notable for the following:    WBC 20.6 (*)    Hemoglobin 10.0 (*)    HCT 33.2 (*)    MCV 71.6 (*)    MCH 21.6 (*)    RDW  19.8 (*)    Platelets 556 (*)    Neutro Abs 18.2 (*)    Monocytes Absolute 1.2 (*)    All other components within normal limits  URINALYSIS, ROUTINE W REFLEX MICROSCOPIC - Abnormal; Notable for the following:    APPearance CLOUDY (*)    Specific Gravity, Urine >1.030 (*)    Hgb urine dipstick LARGE (*)    Ketones, ur 15 (*)    Protein, ur 30 (*)    Leukocytes, UA MODERATE (*)    All other components within normal limits  URINALYSIS, MICROSCOPIC (REFLEX) - Abnormal; Notable for the following:    Bacteria, UA MANY (*)    Squamous Epithelial / LPF 0-5 (*)    All other components within normal limits  COMPREHENSIVE METABOLIC PANEL - Abnormal; Notable for the following:    Glucose, Bld 141 (*)    BUN 30 (*)    Total Protein 6.4 (*)    Albumin 2.3 (*)    ALT <5 (*)    GFR calc non Af Amer 58 (*)    All other components within normal limits  CBC - Abnormal; Notable for the following:    WBC 24.4 (*)    RBC 3.92 (*)    Hemoglobin 8.5 (*)    HCT 28.1 (*)    MCV 71.7 (*)    MCH 21.7 (*)    RDW 19.7 (*)    Platelets 505 (*)    All other components within normal limits  I-STAT CG4 LACTIC ACID, ED - Abnormal; Notable for the following:    Lactic Acid, Venous 2.97 (*)    All other components within normal limits  CULTURE, BLOOD (ROUTINE X 2)  CULTURE, BLOOD (ROUTINE X 2)  URINE CULTURE  LACTIC ACID, PLASMA  LACTIC ACID, PLASMA  PROCALCITONIN  MAGNESIUM  PHOSPHORUS  TSH  I-STAT TROPOININ, ED  I-STAT CG4 LACTIC ACID, ED    EKG  EKG  Interpretation None       Radiology Dg Chest Portable 1 View  Result Date: 01/23/2017 CLINICAL DATA:  Shortness of breath EXAM: PORTABLE CHEST 1 VIEW COMPARISON:  Portable chest x-ray of 01/08/2016 FINDINGS: There is haziness at the left lung base which could represent atelectasis, pneumonia, and/or small left effusion. The right lung is clear. Mediastinal and hilar contours are unremarkable. Cardiomegaly is stable. Median sternotomy sutures are noted from prior CABG. IMPRESSION: Haziness at the left lung base may indicate atelectasis, pneumonia, and possibly small left pleural effusion. Recommend two-view chest x-ray if possible. Electronically Signed   By: Dwyane Dee M.D.   On: 01/23/2017 16:50    Procedures Procedures (including critical care time)  Medications Ordered in ED Medications  vancomycin (VANCOCIN) IVPB 750 mg/150 ml premix (750 mg Intravenous New Bag/Given 01/24/17 0541)  mirtazapine (REMERON) tablet 15 mg (15 mg Oral Given 01/23/17 2344)  montelukast (SINGULAIR) tablet 10 mg (10 mg Oral Given 01/23/17 2344)  carbidopa-levodopa (SINEMET CR) 50-200 MG per tablet controlled release 1 tablet (1 tablet Oral Not Given 01/23/17 2342)  sodium chloride flush (NS) 0.9 % injection 3 mL (3 mLs Intravenous Not Given 01/24/17 1000)  acetaminophen (TYLENOL) tablet 650 mg (not administered)    Or  acetaminophen (TYLENOL) suppository 650 mg (not administered)  HYDROcodone-acetaminophen (NORCO/VICODIN) 5-325 MG per tablet 1-2 tablet (not administered)  ondansetron (ZOFRAN) tablet 4 mg (not administered)    Or  ondansetron (ZOFRAN) injection 4 mg (not administered)  0.9 %  sodium chloride infusion ( Intravenous Stopped 01/24/17 1059)  albuterol (PROVENTIL) (2.5 MG/3ML) 0.083% nebulizer solution 2.5 mg (not administered)  guaiFENesin (MUCINEX) 12 hr tablet 600 mg (600 mg Oral Not Given 01/24/17 1000)  piperacillin-tazobactam (ZOSYN) IVPB 3.375 g (0 g Intravenous Stopped 01/24/17 1459)    polyvinyl alcohol (LIQUIFILM TEARS) 1.4 % ophthalmic solution 1 drop (not administered)  carbidopa-levodopa (SINEMET IR) 25-100 MG per tablet immediate release 2 tablet (2 tablets Oral Given 01/24/17 0823)    And  carbidopa-levodopa (SINEMET IR) 25-100 MG per tablet immediate release 1.5 tablet (1.5 tablets Oral Given 01/24/17 1545)  MEDLINE mouth rinse (not administered)  feeding supplement (ENSURE ENLIVE) (ENSURE ENLIVE) liquid 237 mL (237 mLs Oral Given 01/24/17 1500)  mupirocin ointment (BACTROBAN) 2 % 1 application (1 application Nasal Given 01/24/17 1059)  Chlorhexidine Gluconate Cloth 2 % PADS 6 each (not administered)  heparin injection 5,000 Units (5,000 Units Subcutaneous Given 01/24/17 1545)  morphine 2 MG/ML injection 1-2 mg (not administered)  ipratropium-albuterol (DUONEB) 0.5-2.5 (3) MG/3ML nebulizer solution 3 mL (3 mLs Nebulization Given 01/24/17 1417)  piperacillin-tazobactam (ZOSYN) IVPB 3.375 g (0 g Intravenous Stopped 01/23/17 1839)  vancomycin (VANCOCIN) 1,500 mg in sodium chloride 0.9 % 500 mL IVPB (0 mg Intravenous Stopped 01/23/17 2009)  sodium chloride 0.9 % bolus 1,000 mL (0 mLs Intravenous Stopped 01/23/17 1840)  acetaminophen (TYLENOL) suppository 650 mg (650 mg Rectal Given 01/23/17 1808)     Initial Impression / Assessment and Plan / ED Course  I have reviewed the triage vital signs and the nursing notes.  Pertinent labs & imaging results that were available during my care of the patient were reviewed by me and considered in my medical decision making (see chart for details).     83 year old male history of Parkinson disease, CAD, stroke with right-sided residual hemiparesis, HTN, HLD who presents from assisted living facility for respiratory distress.  Patient nonverbal at baseline though appears to answer nonverbal cues. Acutely worsened this afternoon. Mild cough. Hypoxic at facility to 85% on [place]. Required CPAP by EMS for comfort. Alert on arrival, bilateral breath  sounds relatively clear. Tachypneic on RA. Placed on BiPAP for support.  Febrile by rectal temp 101.7. Lungs with crackles. Abdomen soft, benign throughout.  Initial lactic acid 2.97. CXR showing L lung base haziness. Suspect HCAP. Abx given. Pt admitted for further management and evaluation.  Pt care d/w Dr. Adela Lank  Final Clinical Impressions(s) / ED Diagnoses   Final diagnoses:  HCAP (healthcare-associated pneumonia)  Respiratory distress    New Prescriptions Current Discharge Medication List       Hebert Soho, MD 01/24/17 1709    Melene Plan, DO 01/24/17 1610

## 2017-01-23 NOTE — ED Triage Notes (Addendum)
Per EMS, [facility] Audible rales, tachypneic RR 38 on scene with EMS. Staff also states decreased LOC. Pt has hx of cva and has dysphasia. Placed on CPAP and as tolerated well in route. Pt was given a duoneb 1 hr pta. Alert at this time. Unable to obtain orientation. Pt just looks at you when talked to. VS 84% on 4L [place], 95% on CPAP, A-fib on monitor and no hx. Just finished augmentin for recent PNA. BP 140/90. Pt also has indwelling foley catheter and urine looks infected.

## 2017-01-23 NOTE — Progress Notes (Signed)
Pharmacy Antibiotic Note  Eric Oneill is a 83 y.o. male admitted on 01/23/2017 with pneumonia.  Pharmacy has been consulted for vancomycin dosing. Afebrile with elevated WBC at 20.6 and LA 2.97. SCr 1.22 for estimated CrCl ~ 40-45 mL/min.   Plan: Vancomycin 1.5g IV x1, then 750 mg IV q12hr  Zosyn 3.375g IV x1 per MD  Monitor renal function, clinical picture, and culture data [facility] and [facility] needed (goal 15-20 mcg/mL) F/u length of therapy   Height: 5\' 9"  (175.3 cm) Weight: 182 lb (82.6 kg) IBW/kg (Calculated) : 70.7  Temp (24hrs), Avg:97.1 F (36.2 C), Min:97.1 F (36.2 C), Max:97.1 F (36.2 C)   Recent Labs Lab 01/23/17 1655  LATICACIDVEN 2.97*    CrCl cannot be calculated (Patient's most recent lab result is older than the maximum 21 days allowed.).    Allergies  Allergen Reactions  . Ivp Dye [Iodinated Diagnostic Agents] Other (See Comments)    Dizziness     Antimicrobials this admission: 6/26 Vanc >>  6/26 zosyn x1   Dose adjustments this admission: n/a   Microbiology results: pending  York CeriseKatherine Cook, PharmD Pharmacy Resident  Pager [phone] 01/23/17 5:51 PM

## 2017-01-23 NOTE — ED Notes (Signed)
Pt repositioned to his left side and pillows placed underneath for support.

## 2017-01-23 NOTE — H&P (Signed)
Eric Oneill:[id number] DOB: [birth year] DOA: 01/23/2017     PCP: Darlina Guys, MD   Outpatient Specialists: Neurology Tat  Patient coming from From facility  [facility]  Chief Complaint: Shortness of breath  HPI: Eric Oneill is a 83 y.o. male with medical history significant of Parkinson's disease, CAD,  basal ganglia hemorrhage  in 2014, right hemiparesis, dysarthria, HTN, HLD    Presented with significant increased work of breathing with audible rails tachypnea respirations up to 38. Decreased responsiveness patient was placed on C Pap by EMS for increased work of breathing and given DuoNeb. Patient is nonverbal at baseline was found to be satting 84% on 4 L recently been diagnosed with pneumonia and treated with Augmentin Patient has long-standing indwelling foley catheter.    Regarding pertinent Chronic problems: history of parkinson disease by neurology maintain on carbidopa levodopa  Off of elequis because of anemia and hematuria. History of CVA basal ganglia hemorrhage  in 2014 resulting in dense hemiparesis on the right and dysarthria. History of CAD status post CABG in 2002 IN ER:  Temp (24hrs), Avg:99.1 F (37.3 C), Min:97.1 F (36.2 C), Max:101 F (38.3 C)      on arrival  ED Triage Vitals  Enc Vitals Group     BP 01/23/17 1630 (!) 153/75     Pulse Rate 01/23/17 1630 (!) 111     Resp 01/23/17 1630 (!) 36     Temp 01/23/17 1633 97.1 F (36.2 C)     Temp Source 01/23/17 1633 Tympanic     SpO2 01/23/17 1630 100 %     Weight 01/23/17 1631 182 lb (82.6 kg)     Height 01/23/17 1631 5\' 9"  (1.753 m)     Head Circumference --      Peak Flow --      Pain Score 01/23/17 1631 0     Pain Loc --      Pain Edu? --      Excl. in GC? --    Patient was switched to BiPAP 15/5 on 40% RR 25 HR 74 Bp 133/66  sat 100% LA 2.97 Trop 0.02 Na 137 K 4.2 BUN 31 Cr 1.22 alb 2.7 WBC 20.6 Hg 10.0 close to baseline  CXR: LEFT lung base haziness Following  Medications were ordered in ER: Medications  vancomycin (VANCOCIN) 1,500 mg in sodium chloride 0.9 % 500 mL IVPB (1,500 mg Intravenous New Bag/Given 01/23/17 1809)  vancomycin (VANCOCIN) IVPB 750 mg/150 ml premix (not administered)  piperacillin-tazobactam (ZOSYN) IVPB 3.375 g (3.375 g Intravenous New Bag/Given 01/23/17 1809)  sodium chloride 0.9 % bolus 1,000 mL (1,000 mLs Intravenous New Bag/Given 01/23/17 1810)  acetaminophen (TYLENOL) suppository 650 mg (650 mg Rectal Given 01/23/17 1808)       Hospitalist was called for admission for HCAP causing acute respiratory failure with hypoxia and sepsis  Review of Systems:    Pertinent positives include:  Fevers, chills,  Constitutional:  No weight loss, night sweats, fatigue, weight loss  HEENT:  No headaches, Difficulty swallowing,Tooth/dental problems,Sore throat,  No sneezing, itching, ear ache, nasal congestion, post nasal drip,  Cardio-vascular:  No chest pain, Orthopnea, PND, anasarca, dizziness, palpitations.no Bilateral lower extremity swelling  GI:  No heartburn, indigestion, abdominal pain, nausea, vomiting, diarrhea, change in bowel habits, loss of appetite, melena, blood in stool, hematemesis Resp:  no shortness of breath at rest. No dyspnea on exertion, No excess mucus, no productive cough, No non-productive cough, No coughing up of  blood.No change in color of mucus.No wheezing. Skin:  no rash or lesions. No jaundice GU:  no dysuria, change in color of urine, no urgency or frequency. No straining to urinate.  No flank pain.  Musculoskeletal:  No joint pain or no joint swelling. No decreased range of motion. No back pain.  Psych:  No change in mood or affect. No depression or anxiety. No memory loss.  Neuro: no localizing neurological complaints, no tingling, no weakness, no double vision, no gait abnormality, no slurred speech, no confusion  As per HPI otherwise 10 point review of systems negative.   Past Medical  History: Past Medical History:  Diagnosis Date  . Coronary artery disease   . Depression   . Parkinson disease (HCC)    dx: 06/2007  . Stroke [facility])    05-2013   Past Surgical History:  Procedure Laterality Date  . CARPAL TUNNEL RELEASE     L  . CIRCUMCISION  Aug 2002  . CORONARY ARTERY BYPASS GRAFT    . HERNIA REPAIR     x2  . KNEE SURGERY     bilateral  . PEG PLACEMENT N/A 07/02/2013   Procedure: PERCUTANEOUS ENDOSCOPIC GASTROSTOMY (PEG) PLACEMENT;  Surgeon: Cherylynn Ridges, MD;  Location: [facility] ENDOSCOPY;  Service: General;  Laterality: N/A;     Social History:  Ambulatory  bed bound    reports that he quit smoking about 58 years ago. He has never used smokeless tobacco. He reports that he drinks alcohol. He reports that he does not use drugs.  Allergies:   Allergies  Allergen Reactions  . Ivp Dye [Iodinated Diagnostic Agents] Other (See Comments)    Dizziness        Family History:  Family History  Problem Relation Age of Onset  . Stroke Father   . Cancer Sister        breast  . Cancer Brother        myeloma    Medications: Prior to Admission medications   Medication Sig Start Date End Date Taking? Authorizing Provider  acetaminophen (TYLENOL) 325 MG tablet Take 650 mg by mouth every 6 (six) hours as needed.    [provider]  carbidopa-levodopa (SINEMET CR) 50-200 MG per tablet Take 1 tablet by mouth at bedtime.    [provider]  carbidopa-levodopa (SINEMET IR) 25-100 MG per tablet Take by mouth. Take 2 tablets by mouth at 7 am, 1.5 tablets by mouth at 11 am, 1.5 tablets by mouth at 3 pm, 1.5 tablets by mouth at 7 pm    [provider]  cetirizine (ZYRTEC ALLERGY) 10 MG tablet Take 1 tablet (10 mg total) by mouth daily. 05/18/14   Tat, Octaviano Batty, DO  dextromethorphan-guaiFENesin (MUCINEX DM) 30-600 MG 12hr tablet Take 1 tablet by mouth 2 (two) times daily.    [provider]  docusate sodium (COLACE) 100 MG capsule  Take 100 mg by mouth daily.    [provider]  escitalopram (LEXAPRO) 10 MG tablet Take 1 tablet (10 mg total) by mouth daily. 06/09/13   Tat, Octaviano Batty, DO  hydroxypropyl methylcellulose / hypromellose (ISOPTO TEARS / GONIOVISC) 2.5 % ophthalmic solution 1 drop.    [provider]  ipratropium-albuterol (DUONEB) 0.5-2.5 (3) MG/3ML SOLN Take 3 mLs by nebulization.    [provider]  mirtazapine (REMERON) 15 MG tablet Take 15 mg by mouth at bedtime.    [provider]  montelukast (SINGULAIR) 10 MG tablet Take 10 mg by  mouth at bedtime.    [provider]  omeprazole (PRILOSEC) 10 MG capsule 20 mg by PEG Tube route daily.    [provider]  polyethylene glycol (MIRALAX / GLYCOLAX) packet Take 17 g by mouth daily.    [provider]  simvastatin (ZOCOR) 40 MG tablet Take 40 mg by mouth at bedtime.      [provider]    Physical Exam: Patient Vitals for the past 24 hrs:  BP Temp Temp src Pulse Resp SpO2 Height Weight  01/23/17 1815 133/66 - - (!) 275 (!) 25 (!) 72 % - -  01/23/17 1810 - (!) 101 F (38.3 C) Rectal - - - - -  01/23/17 1745 120/60 - - 85 (!) 27 100 % - -  01/23/17 1730 (!) 119/54 - - 91 (!) 33 94 % - -  01/23/17 1715 (!) 129/56 - - 97 (!) 28 100 % - -  01/23/17 1700 (!) 134/55 - - - - - - -  01/23/17 1645 (!) 142/54 - - - (!) 32 - - -  01/23/17 1633 (!) 153/75 97.1 F (36.2 C) Tympanic 93 (!) 35 100 % - -  01/23/17 1631 - - - - - - 5\' 9"  (1.753 m) 82.6 kg (182 lb)  01/23/17 1630 (!) 153/75 - - (!) 111 (!) 36 100 % - -    1. General:  in No Acute distress On BiPAP 2. Psychological: Alert but unable to assess orientation patient is nonverbal 3. Head/ENT:   Dry Mucous Membranes                          Head Non traumatic, neck supple                          Poor Dentition 4. SKIN:  decreased Skin turgor,  Skin clean Dry and intact no rash 5. Heart: Regular rate and rhythm systolic Murmur, Rub or  gallop 6. Lungs:  no wheezes some crackles  BIPAP air movement 7. Abdomen: Soft,  non-tender, Non distended, PEG in PLaCE 8. Lower extremities: no clubbing, cyanosis, or edema 9. Neurologically profound weakness on the right upper and lower extremities with contractures 10. MSK: Normal range of motion   body mass index is 26.88 kg/m.  Labs on Admission:   Labs on Admission: I have personally reviewed following labs and imaging studies  CBC:  Recent Labs Lab 01/23/17 1641  WBC 20.6*  NEUTROABS 18.2*  HGB 10.0*  HCT 33.2*  MCV 71.6*  PLT 556*   Basic Metabolic Panel:  Recent Labs Lab 01/23/17 1641  NA 137  K 4.2  CL 106  CO2 21*  GLUCOSE 137*  BUN 31*  CREATININE 1.22  CALCIUM 9.6   GFR: Estimated Creatinine Clearance: 43.5 mL/min (by C-G formula based on SCr of 1.22 mg/dL). Liver Function Tests:  Recent Labs Lab 01/23/17 1641  AST 25  ALT <5*  ALKPHOS 101  BILITOT 0.9  PROT 7.6  ALBUMIN 2.7*   No results for input(s): LIPASE, AMYLASE in the last 168 hours. No results for input(s): AMMONIA in the last 168 hours. Coagulation Profile: No results for input(s): INR, PROTIME in the last 168 hours. Cardiac Enzymes: No results for input(s): CKTOTAL, CKMB, CKMBINDEX, TROPONINI in the last 168 hours. BNP (last 3 results) No results for input(s): PROBNP in the last 8760 hours. HbA1C: No results for input(s): HGBA1C in the  last 72 hours. CBG: No results for input(s): GLUCAP in the last 168 hours. Lipid Profile: No results for input(s): CHOL, HDL, LDLCALC, TRIG, CHOLHDL, LDLDIRECT in the last 72 hours. Thyroid Function Tests: No results for input(s): TSH, T4TOTAL, FREET4, T3FREE, THYROIDAB in the last 72 hours. Anemia Panel: No results for input(s): VITAMINB12, FOLATE, FERRITIN, TIBC, IRON, RETICCTPCT in the last 72 hours. Urine analysis:   @LABRCNTIP (procalcitonin:4,lacticidven:4) )No results found for this or any previous visit (from the past 240  hour(s)).     UA   ordered  No results found for: HGBA1C  Estimated Creatinine Clearance: 43.5 mL/min (by C-G formula based on SCr of 1.22 mg/dL).  BNP (last 3 results) No results for input(s): PROBNP in the last 8760 hours.   ECG REPORT ordered  Filed Weights   01/23/17 1631  Weight: 82.6 kg (182 lb)     Cultures: No results found for: SDES, SPECREQUEST, CULT, REPTSTATUS   Radiological Exams on Admission: Dg Chest Portable 1 View  Result Date: 01/23/2017 CLINICAL DATA:  Shortness of breath EXAM: PORTABLE CHEST 1 VIEW COMPARISON:  Portable chest x-ray of 01/08/2016 FINDINGS: There is haziness at the left lung base which could represent atelectasis, pneumonia, and/or small left effusion. The right lung is clear. Mediastinal and hilar contours are unremarkable. Cardiomegaly is stable. Median sternotomy sutures are noted from prior CABG. IMPRESSION: Haziness at the left lung base may indicate atelectasis, pneumonia, and possibly small left pleural effusion. Recommend two-view chest x-ray if possible. Electronically Signed   By: Dwyane Dee M.D.   On: 01/23/2017 16:50    Chart has been reviewed    Assessment/Plan   83 y.o. male with medical history significant of Parkinson's disease, CAD,  basal ganglia hemorrhage  in 2014, right hemiparesis, dysarthria, hx of A.fib off anticoagulation due to chronic hematuria  Admitted for HCAP vs aspiration pneumonia causing acute respiratory failure with hypoxia and sepsis   Present on Admission: .  Sepsis (HCC) - Admit per Sepsis protocol likely source being  UTI vs aspiration pneumonia, cellulitis,    Rehydrated  - initiate broad spectrum antibiotics Vancomycin and Zosyn  -  obtain blood cultures  - Obtain serial lactic acid  - Obtain procalcitonin level  - Admit and monitor vital signs closely  -    Sepsis - Repeat Assessment  Performed at:    19:35  Vitals     Blood pressure (!) 124/54, pulse 70, temperature (!) 101 F (38.3  C), temperature source Rectal, resp. rate (!) 23, height 5\' 9"  (1.753 m), weight 82.6 kg (182 lb), SpO2 100 %.  Heart:     Irregular rate and rhythm  Lungs:    Rales  Capillary Refill:   <2 sec  Peripheral Pulse:   Radial pulse palpable  Skin:     Normal Color    . CAD (coronary artery disease) stable continue to monitor her family at this point would like to avoid aggressive interventions . HCAP (healthcare-associated pneumonia) vs . Aspiration pneumonia (HCC) - Discussed at length that the patient is at risk for recurrent aspiration this point family would like him to continue  to eat for comfort. Family aware of the patient being at risk for recurrent aspiration will work on MOST form .Marland Kitchen Acute respiratory failure with hypoxia (HCC) At this point family agreeable to BiPAP for respiratory support throughout the night and then probably transitioning to comfort care with palliative care consult . Bacteriuria with pyuria - await urine culture will cover with Zosyn  for now. Hypertension - given sepsis along permissive hypertension for tonight Parkinson disorder continue home medications History of atrial fibrillation patient is not on anticoagulation secondary to persistent hematuria currently rate controlled  Other plan as per orders.  DVT prophylaxis:  SCD    Code Status:    DNR/DNI  Ok to have Bibap other wise comfort care as per  family   Family Communication:   Family  at  Bedside  plan of care was discussed with  Daughter,   Disposition Plan:                              Back to current facility when stable                                               Would benefit from PT/OT eval prior to DC  ordered                      Social Work  Nutrition Palliative care   consulted                          Consults called: none  Admission status:  inpatient       Level of care         SDU      I have spent a total of 65  min on this admission  extra time was spent to discuss  case with family to discuss CODE STATUS and goals of care  Ieasha Boerema 01/23/2017, 8:14 PM    [facility]  Pager [phone]   after 2 AM please page floor coverage PA If 7AM-7PM, please contact the day team taking care of the patient  [email or web address]  Password [email or web address]

## 2017-01-23 NOTE — Progress Notes (Signed)
Patient arrived via EMS on their CPAP.  Was placed on hospital bipap on settings of 15/5, RR of 12, and 40%.  Patient is currently tolerating well.  Will continue to monitor.

## 2017-01-24 DIAGNOSIS — L8990 Pressure ulcer of unspecified site, unspecified stage: Secondary | ICD-10-CM | POA: Insufficient documentation

## 2017-01-24 LAB — BLOOD CULTURE ID PANEL (REFLEXED)
Acinetobacter baumannii: NOT DETECTED
CANDIDA TROPICALIS: NOT DETECTED
Candida albicans: NOT DETECTED
Candida glabrata: NOT DETECTED
Candida krusei: NOT DETECTED
Candida parapsilosis: NOT DETECTED
ENTEROBACTER CLOACAE COMPLEX: NOT DETECTED
ENTEROCOCCUS SPECIES: NOT DETECTED
Enterobacteriaceae species: NOT DETECTED
Escherichia coli: NOT DETECTED
HAEMOPHILUS INFLUENZAE: NOT DETECTED
KLEBSIELLA PNEUMONIAE: NOT DETECTED
Klebsiella oxytoca: NOT DETECTED
LISTERIA MONOCYTOGENES: NOT DETECTED
Methicillin resistance: NOT DETECTED
NEISSERIA MENINGITIDIS: NOT DETECTED
PROTEUS SPECIES: NOT DETECTED
Pseudomonas aeruginosa: NOT DETECTED
SERRATIA MARCESCENS: NOT DETECTED
STAPHYLOCOCCUS AUREUS BCID: NOT DETECTED
STAPHYLOCOCCUS SPECIES: DETECTED — AB
STREPTOCOCCUS AGALACTIAE: NOT DETECTED
STREPTOCOCCUS SPECIES: NOT DETECTED
Streptococcus pneumoniae: NOT DETECTED
Streptococcus pyogenes: NOT DETECTED

## 2017-01-24 LAB — MRSA PCR SCREENING: MRSA by PCR: POSITIVE — AB

## 2017-01-24 LAB — PHOSPHORUS: PHOSPHORUS: 3.2 mg/dL (ref 2.5–4.6)

## 2017-01-24 LAB — COMPREHENSIVE METABOLIC PANEL
ALT: <5 U/L — ABNORMAL LOW (ref 17–63)
AST: 17 U/L (ref 15–41)
Albumin: 2.3 g/dL — ABNORMAL LOW (ref 3.5–5.0)
Alkaline Phosphatase: 77 U/L (ref 38–126)
Anion gap: 7 (ref 5–15)
BUN: 30 mg/dL — AB (ref 6–20)
CHLORIDE: 109 mmol/L (ref 101–111)
CO2: 22 mmol/L (ref 22–32)
Calcium: 8.9 mg/dL (ref 8.9–10.3)
Creatinine, Ser: 1.11 mg/dL (ref 0.61–1.24)
GFR calc Af Amer: >60 mL/min (ref >60)
GFR, EST NON AFRICAN AMERICAN: 58 mL/min — AB (ref >60)
Glucose, Bld: 141 mg/dL — ABNORMAL HIGH (ref 65–99)
POTASSIUM: 3.7 mmol/L (ref 3.5–5.1)
Sodium: 138 mmol/L (ref 135–145)
Total Bilirubin: 1 mg/dL (ref 0.3–1.2)
Total Protein: 6.4 g/dL — ABNORMAL LOW (ref 6.5–8.1)

## 2017-01-24 LAB — CBC
HEMATOCRIT: 28.1 % — AB (ref 39.0–52.0)
HEMOGLOBIN: 8.5 g/dL — AB (ref 13.0–17.0)
MCH: 21.7 pg — ABNORMAL LOW (ref 26.0–34.0)
MCHC: 30.2 g/dL (ref 30.0–36.0)
MCV: 71.7 fL — AB (ref 78.0–100.0)
Platelets: 505 10*3/uL — ABNORMAL HIGH (ref 150–400)
RBC: 3.92 MIL/uL — AB (ref 4.22–5.81)
RDW: 19.7 % — AB (ref 11.5–15.5)
WBC: 24.4 10*3/uL — AB (ref 4.0–10.5)

## 2017-01-24 LAB — PROCALCITONIN: Procalcitonin: 0.89 ng/mL

## 2017-01-24 LAB — LACTIC ACID, PLASMA
LACTIC ACID, VENOUS: 1.5 mmol/L (ref 0.5–1.9)
LACTIC ACID, VENOUS: 1.6 mmol/L (ref 0.5–1.9)

## 2017-01-24 LAB — MAGNESIUM: Magnesium: 1.9 mg/dL (ref 1.7–2.4)

## 2017-01-24 LAB — TSH: TSH: 2.097 u[IU]/mL (ref 0.350–4.500)

## 2017-01-24 MED ORDER — MUPIROCIN 2 % EX OINT
1.0000 "application " | TOPICAL_OINTMENT | Freq: Two times a day (BID) | CUTANEOUS | Status: DC
  Administered 2017-01-24 – 2017-01-25 (×3): 1 via NASAL
  Filled 2017-01-24: qty 22

## 2017-01-24 MED ORDER — HEPARIN SODIUM (PORCINE) 5000 UNIT/ML IJ SOLN
5000.0000 [IU] | Freq: Three times a day (TID) | INTRAMUSCULAR | Status: DC
  Administered 2017-01-24 – 2017-01-25 (×3): 5000 [IU] via SUBCUTANEOUS
  Filled 2017-01-24 (×3): qty 1

## 2017-01-24 MED ORDER — MORPHINE SULFATE (PF) 2 MG/ML IV SOLN
1.0000 mg | INTRAVENOUS | Status: DC | PRN
  Administered 2017-01-25: 1 mg via INTRAVENOUS
  Filled 2017-01-24: qty 1

## 2017-01-24 MED ORDER — ENSURE ENLIVE PO LIQD
237.0000 mL | Freq: Two times a day (BID) | ORAL | Status: DC
  Administered 2017-01-24 – 2017-01-25 (×2): 237 mL via ORAL

## 2017-01-24 MED ORDER — IPRATROPIUM-ALBUTEROL 0.5-2.5 (3) MG/3ML IN SOLN
3.0000 mL | Freq: Three times a day (TID) | RESPIRATORY_TRACT | Status: DC
  Administered 2017-01-24 – 2017-01-25 (×3): 3 mL via RESPIRATORY_TRACT
  Filled 2017-01-24 (×2): qty 3

## 2017-01-24 MED ORDER — ORAL CARE MOUTH RINSE
15.0000 mL | Freq: Two times a day (BID) | OROMUCOSAL | Status: DC
  Administered 2017-01-25: 15 mL via OROMUCOSAL

## 2017-01-24 MED ORDER — CHLORHEXIDINE GLUCONATE CLOTH 2 % EX PADS
6.0000 | MEDICATED_PAD | Freq: Every day | CUTANEOUS | Status: DC
  Administered 2017-01-25: 6 via TOPICAL

## 2017-01-24 NOTE — Progress Notes (Signed)
OT Cancellation Note  Patient Details Name: Eric Oneill MRN: [id number] DOB: 08/17/1927   Cancelled Treatment:    Reason Eval/Treat Not Completed: Medical issues which prohibited therapy.  Notes reviewed and spoke with RN.  Family to meet with palliative tomorrow with possibility of moving toward full comfort care.  Will check back tomorrow to determine appropriateness of OT.  [name], OTR/L [phone]   [name], [name] 01/24/2017, 2:12 PM

## 2017-01-24 NOTE — Progress Notes (Signed)
PROGRESS NOTE                                                                                                                                                                                                             Patient Demographics:    Eric Oneill, is a 83 y.o. male, DOB - 04-Mar-1928, UJW:[id number]  Admit date - 01/23/2017   Admitting Physician Therisa DoyneAnastassia Doutova, MD  Outpatient Primary MD for the patient is Eric GuysPowell, Jerry, MD  LOS - 1  Chief Complaint  Patient presents with  . Respiratory Distress       Brief Narrative   83 y.o. male with medical history significant of Parkinson's disease, CAD,  basal ganglia hemorrhage  in 2014, right hemiparesis, dysarthria, HTN, HLD, patient presents with respiratory distress, found to be in acute hypoxic respiratory failure secondary to pneumonia, requiring BiPAP initially, currently back on nasal cannula, palliative medicine to see today regarding goals of care.   Subjective:    Eric Haywardlfred Geffre today Denies any complaints, daughter at bedside reports some cough, denies any nausea vomiting or abdominal pain .    Assessment  & Plan :    Active Problems:   Hypertension   Hemiparesis affecting right side as late effect of cerebrovascular accident (HCC)   Sepsis (HCC)   CAD (coronary artery disease)   HCAP (healthcare-associated pneumonia)   Acute respiratory failure with hypoxia (HCC)   Aspiration pneumonia (HCC)   Bacteriuria with pyuria   Pressure injury of skin  Sepsis secondary to aspiration pneumonia - Sepsis mid on admission, tachypnea, leukocytosis and elevated lactic acid, this is most likely related to aspiration pneumonia, as well UTI. - Lactic acid back to normal, continue to trend pro-calcitonin, continue with gentle hydration.  Pneumonia - Most likely aspiration pneumonia, given chronic dysphagia secondary to Parkinson, left lung base infiltrate, continue with IV vancomycin and  Zosyn, follow on blood cultures.  UTI - Continue with IV Zosyn, follow on urine cultures  Chronic dysphagia - Secondary to Parkinson, and old stroke, per daughter patient on dysphagia 1 with nectar thick, but with limited oral intake when nectar thick, so he is on clear liquid, she understands risk of aspiration, but they wish continue with clear liquid for comfort , palliative has been consulted to discuss goals of care, and to fill MOST form.  CAD (coronary  artery disease) -  stable continue to monitor her family at this point would like to avoid aggressive interventions  Acute respiratory failure with hypoxia (HCC)  - Required BiPAP initially, currently on nasal cannula, they are agreeable to BiPAP when necessary, he is DO NOT INTUBATE .  Hypertension  - Acceptable off medication   Parkinson disorder  - continue home medications  History of atrial fibrillation  - patient is not on anticoagulation secondary to persistent hematuria currently rate controlled     Code Status : DNR  Family Communication  : daughter at ebdside  Disposition Plan  : pending further work up  Consults  :  Palliative  Procedures  : None  DVT Prophylaxis  : Heparin - SCDs   Lab Results  Component Value Date   PLT 505 (H) 01/24/2017    Antibiotics  :    Anti-infectives    Start     Dose/Rate Route Frequency Ordered Stop   01/24/17 0600  vancomycin (VANCOCIN) IVPB 750 mg/150 ml premix     750 mg 150 mL/hr over 60 Minutes Intravenous Every 12 hours 01/23/17 1753 01/31/17 0559   01/24/17 0130  piperacillin-tazobactam (ZOSYN) IVPB 3.375 g  Status:  Discontinued     3.375 g 100 mL/hr over 30 Minutes Intravenous Every 8 hours 01/23/17 2300 01/23/17 2314   01/24/17 0100  piperacillin-tazobactam (ZOSYN) IVPB 3.375 g     3.375 g 12.5 mL/hr over 240 Minutes Intravenous Every 8 hours 01/23/17 2315     01/23/17 1730  piperacillin-tazobactam (ZOSYN) IVPB 3.375 g     3.375 g 100 mL/hr over 30  Minutes Intravenous  Once 01/23/17 1719 01/23/17 1839   01/23/17 1730  vancomycin (VANCOCIN) 1,500 mg in sodium chloride 0.9 % 500 mL IVPB     1,500 mg 250 mL/hr over 120 Minutes Intravenous  Once 01/23/17 1720 01/23/17 2009   01/23/17 1715  piperacillin-tazobactam (ZOSYN) IVPB 2.25 g  Status:  Discontinued     2.25 g 100 mL/hr over 30 Minutes Intravenous  Once 01/23/17 1714 01/23/17 1718        Objective:   Vitals:   01/23/17 2200 01/23/17 2238 01/24/17 0532 01/24/17 0812  BP: (!) 122/55 126/63 (!) 111/55 (!) 117/50  Pulse: 72 67 69 71  Resp: (!) 23 (!) 24 19 (!) 24  Temp:  98.2 F (36.8 C) 97.6 F (36.4 C) 97.8 F (36.6 C)  TempSrc:  Oral Axillary Axillary  SpO2: 90% 100% 96% 96%  Weight:  69.9 kg (154 lb 1.6 oz)    Height:  5\' 9"  (1.753 m)      Wt Readings from Last 3 Encounters:  01/23/17 69.9 kg (154 lb 1.6 oz)  07/04/13 82.7 kg (182 lb 4.8 oz)  06/09/13 89.9 kg (198 lb 4.8 oz)     Intake/Output Summary (Last 24 hours) at 01/24/17 1129 Last data filed at 01/24/17 1040  Gross per 24 hour  Intake          2291.33 ml  Output              675 ml  Net          1616.33 ml     Physical Exam  Awake Alert, Oriented , Extremely hard of hearing Symmetrical Chest wall movement, Good air movement bilaterally, no use of accessory muscles RRR,No Gallops,Rubs or new Murmurs, No Parasternal Heave +ve B.Sounds, Abd Soft, No tenderness, No rebound - guarding or rigidity. No Cyanosis, Clubbing or edema, chronic right-sided contracture  and weakness    Data Review:    CBC  Recent Labs Lab 01/23/17 1641 01/24/17 0225  WBC 20.6* 24.4*  HGB 10.0* 8.5*  HCT 33.2* 28.1*  PLT 556* 505*  MCV 71.6* 71.7*  MCH 21.6* 21.7*  MCHC 30.1 30.2  RDW 19.8* 19.7*  LYMPHSABS 1.2  --   MONOABS 1.2*  --   EOSABS 0.0  --   BASOSABS 0.0  --     Chemistries   Recent Labs Lab 01/23/17 1641 01/24/17 0225  NA 137 138  K 4.2 3.7  CL 106 109  CO2 21* 22  GLUCOSE 137* 141*  BUN  31* 30*  CREATININE 1.22 1.11  CALCIUM 9.6 8.9  MG  --  1.9  AST 25 17  ALT <5* <5*  ALKPHOS 101 77  BILITOT 0.9 1.0   ------------------------------------------------------------------------------------------------------------------ No results for input(s): CHOL, HDL, LDLCALC, TRIG, CHOLHDL, LDLDIRECT in the last 72 hours.  No results found for: HGBA1C ------------------------------------------------------------------------------------------------------------------  Recent Labs  01/24/17 0225  TSH 2.097   ------------------------------------------------------------------------------------------------------------------ No results for input(s): VITAMINB12, FOLATE, FERRITIN, TIBC, IRON, RETICCTPCT in the last 72 hours.  Coagulation profile No results for input(s): INR, PROTIME in the last 168 hours.  No results for input(s): DDIMER in the last 72 hours.  Cardiac Enzymes No results for input(s): CKMB, TROPONINI, MYOGLOBIN in the last 168 hours.  Invalid input(s): CK ------------------------------------------------------------------------------------------------------------------ No results found for: BNP  Inpatient Medications  Scheduled Meds: . carbidopa-levodopa  1 tablet Oral QHS  . carbidopa-levodopa  2 tablet Oral [id number]   And  . carbidopa-levodopa  1.5 tablet Oral TID  . [START ON 01/25/2017] Chlorhexidine Gluconate Cloth  6 each Topical Q0600  . feeding supplement (ENSURE ENLIVE)  237 mL Oral BID BM  . guaiFENesin  600 mg Oral BID  . ipratropium-albuterol  3 mL Nebulization Q6H  . [START ON 01/25/2017] mouth rinse  15 mL Mouth Rinse BID  . mirtazapine  15 mg Oral QHS  . montelukast  10 mg Oral QHS  . mupirocin ointment  1 application Nasal BID  . sodium chloride flush  3 mL Intravenous Q12H   Continuous Infusions: . piperacillin-tazobactam (ZOSYN)  IV 3.375 g (01/24/17 1059)  . vancomycin 750 mg (01/24/17 0541)   PRN Meds:.acetaminophen **OR** acetaminophen,  albuterol, HYDROcodone-acetaminophen, ondansetron **OR** ondansetron (ZOFRAN) IV, polyvinyl alcohol  Micro Results Recent Results (from the past 240 hour(s))  MRSA PCR Screening     Status: Abnormal   Collection Time: 01/23/17 11:45 PM  Result Value Ref Range Status   MRSA by PCR POSITIVE (A) NEGATIVE Final    Comment:        The GeneXpert MRSA Assay (FDA approved for NASAL specimens only), is one component of a comprehensive MRSA colonization surveillance program. It is not intended to diagnose MRSA infection nor to guide or monitor treatment for MRSA infections. RESULT CALLED TO, READ BACK BY AND VERIFIED WITH: K.PHILLIPS,RN AT 0212 BY L.PITT 01/24/17     Radiology Reports Dg Chest Portable 1 View  Result Date: 01/23/2017 CLINICAL DATA:  Shortness of breath EXAM: PORTABLE CHEST 1 VIEW COMPARISON:  Portable chest x-ray of 01/08/2016 FINDINGS: There is haziness at the left lung base which could represent atelectasis, pneumonia, and/or small left effusion. The right lung is clear. Mediastinal and hilar contours are unremarkable. Cardiomegaly is stable. Median sternotomy sutures are noted from prior CABG. IMPRESSION: Haziness at the left lung base may indicate atelectasis, pneumonia, and possibly small left pleural effusion. Recommend two-view chest  x-ray if possible. Electronically Signed   By: Dwyane Dee M.D.   On: 01/23/2017 16:50     Synia Douglass M.D on 01/24/2017 at 11:29 AM  Between 7am to 7pm - Pager - [phone]  After 7pm go to [email or web address] - password [facility]  [facility] -  Office  [phone]

## 2017-01-24 NOTE — Progress Notes (Signed)
  PHARMACY - PHYSICIAN COMMUNICATION CRITICAL VALUE ALERT - BLOOD CULTURE IDENTIFICATION (BCID)  Results for orders placed or performed during the hospital encounter of 01/23/17  Blood Culture ID Panel (Reflexed) (Collected: 01/23/2017  4:54 PM)  Result Value Ref Range   Enterococcus species NOT DETECTED NOT DETECTED   Listeria monocytogenes NOT DETECTED NOT DETECTED   Staphylococcus species DETECTED (A) NOT DETECTED   Staphylococcus aureus NOT DETECTED NOT DETECTED   Methicillin resistance NOT DETECTED NOT DETECTED   Streptococcus species NOT DETECTED NOT DETECTED   Streptococcus agalactiae NOT DETECTED NOT DETECTED   Streptococcus pneumoniae NOT DETECTED NOT DETECTED   Streptococcus pyogenes NOT DETECTED NOT DETECTED   Acinetobacter baumannii NOT DETECTED NOT DETECTED   Enterobacteriaceae species NOT DETECTED NOT DETECTED   Enterobacter cloacae complex NOT DETECTED NOT DETECTED   Escherichia coli NOT DETECTED NOT DETECTED   Klebsiella oxytoca NOT DETECTED NOT DETECTED   Klebsiella pneumoniae NOT DETECTED NOT DETECTED   Proteus species NOT DETECTED NOT DETECTED   Serratia marcescens NOT DETECTED NOT DETECTED   Haemophilus influenzae NOT DETECTED NOT DETECTED   Neisseria meningitidis NOT DETECTED NOT DETECTED   Pseudomonas aeruginosa NOT DETECTED NOT DETECTED   Candida albicans NOT DETECTED NOT DETECTED   Candida glabrata NOT DETECTED NOT DETECTED   Candida krusei NOT DETECTED NOT DETECTED   Candida parapsilosis NOT DETECTED NOT DETECTED   Candida tropicalis NOT DETECTED NOT DETECTED    Name of physician (or Provider) Contacted: Elgergawy (via text page)  Changes to prescribed antibiotics required: None - possible contaminant. The patient continues on Vancomycin + Zosyn for HCAP/UTI coverage.   Rolley SimsMartin, Ameerah Huffstetler Ann 01/24/2017  2:32 PM

## 2017-01-24 NOTE — Clinical Social Work Note (Signed)
CSW confirmed with admissions coordinator at [facility] that patient is a long-term resident. Per palliative note, there is a meeting scheduled for tomorrow at 9:00 am. CSW will follow for recommendations.  Charlynn CourtSarah Saphia Vanderford, CSW [phone]

## 2017-01-24 NOTE — Progress Notes (Signed)
No charge note  Spoke with dtrs Andrey CampanileSandy and TamoraLeslie at bedside.  We decided to wait for 3rd dtr Rinaldo Cloudamela to be here for our meeting.  It is scheduled for 6/28 at 9:00 am.  D[facility]ed [facility] of aspiration with [facility] ([facility]).  She understands and accepts the risk of aspiration.  She wants him to have comfort feedings as he desires.   We discussed comfort meds for dyspnea if they are needed.  They consented to comfort meds.  His comfort is the family's top priority.    Algis DownsMarianne York, [place] JerseyPA-C Palliative Medicine Pager: [phone]

## 2017-01-24 NOTE — Evaluation (Signed)
Physical Therapy Evaluation Patient Details Name: Eric Oneill T Boettcher MRN: [id number] DOB: 1928/04/11 Today's Date: 01/24/2017   History of Present Illness  Pt is a 83 yo male [facility] from [facility] with c/o SoB, fever and chills, possibly related to a UTI. 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   Clinical Impression  Pt admitted with above diagnosis. Pt currently with functional limitations due to the deficits listed below (see PT Problem List). Pt is currently max Ax2 for bed mobility. Pt able to tolerate sitting EoB for 2 minutes with 15 seconds of min guard assist.  Pt will benefit from skilled PT to increase their independence and safety with mobility to allow discharge to the venue listed below.       Follow Up Recommendations SNF    Equipment Recommendations  None recommended by PT    Recommendations for Other Services Speech consult     Precautions / Restrictions Precautions Required Braces or Orthoses: Other Brace/Splint Other Brace/Splint: wears splint on R wrist 5 hours per day per daughter Restrictions Weight Bearing Restrictions: No      Mobility  Bed Mobility Overal bed mobility: Needs Assistance Bed Mobility: Supine to Sit;Sit to Supine Rolling: Max assist;+2 for physical assistance   Supine to sit: Max assist Sit to supine: Total assist   General bed mobility comments: pt able to reach for bed rail on L side and give minor assist in pulling up to sit EoB, pt fatigued after sitting and requires total assist to get back in bed      Balance Overall balance assessment: Needs assistance Sitting-balance support: Single extremity supported;Feet supported Sitting balance-Leahy Scale: Poor Sitting balance - Comments: Pt sat EoB for 2 min with minA 15 sec of which was min guard with pt holding onto bedrail Postural control:  Posterior lean;Left lateral lean                                   Pertinent Vitals/Pain Pain Assessment: Faces Faces Pain Scale: Hurts a little bit Pain Descriptors / Indicators: Grimacing Pain Intervention(s): Monitored during session;Limited activity within patient's tolerance    Home Living Family/patient expects to be discharged to:: Skilled nursing facility                      Prior Function Level of Independence: Needs assistance   Gait / Transfers Assistance Needed: total assist to power chair that he can opperate independently  ADL's / Homemaking Assistance Needed: total assist           Extremity/Trunk Assessment   Upper Extremity Assessment Upper Extremity Assessment: RUE deficits/detail RUE Deficits / Details: hemiparesis    Lower Extremity Assessment Lower Extremity Assessment: RLE deficits/detail RLE Deficits / Details: hemiparesis LLE Deficits / Details: generalized weakness and intention tremors from Parkinson's       Communication   Communication: Expressive difficulties;HOH;Other (comment) (daughter in room and better able to understand pt communicat)  Cognition Arousal/Alertness: Awake/alert Behavior During Therapy: Flat affect Overall Cognitive Status:  (daughter states he is at his baseline)                                        General Comments General comments (skin integrity, edema, etc.):  Daughter present in room, pt on 15 L O2  FiO2 55% via mask and maintained SaO2 above 90% throughout session , at rest BP 111/67, HR 53 bpm, RR 22 after activity BP 116/53, HR 66 bpm RR 23  Pt in Afib thoughout session with multiple PVC per nursing this is his baseline.        Assessment/Plan    PT Assessment Patient needs continued PT services  PT Problem List Cardiopulmonary status limiting activity;Decreased activity tolerance;Decreased mobility;Decreased balance;Decreased strength;Other (comment) (R sided  hemiparesis)       PT Treatment Interventions Functional mobility training;Therapeutic activities;Therapeutic exercise;Balance training;Patient/family education    PT Goals (Current goals can be found in the Care Plan section)  Acute Rehab PT Goals Patient Stated Goal: feel better PT Goal Formulation: With patient Time For Goal Achievement: 01/31/17 Potential to Achieve Goals: Fair    Frequency Min 2X/week    AM-PAC PT "6 Clicks" Daily Activity  Outcome Measure Difficulty turning over in bed (including adjusting bedclothes, sheets and blankets)?: Total Difficulty moving from lying on back to sitting on the side of the bed? : Total Difficulty sitting down on and standing up from a chair with arms (e.g., wheelchair, bedside commode, etc,.)?: Total Help needed moving to and from a bed to chair (including a wheelchair)?: Total Help needed walking in hospital room?: Total Help needed climbing 3-5 steps with a railing? : Total 6 Click Score: 6    End of Session Equipment Utilized During Treatment: Oxygen Activity Tolerance: Patient tolerated treatment well Patient left: in bed;with call bell/phone within reach;with family/visitor present Nurse Communication: Mobility status PT Visit Diagnosis: Muscle weakness (generalized) (M62.81);Other abnormalities of gait and mobility (R26.89)    Time: 1610-9604 PT Time Calculation (min) (ACUTE ONLY): 28 min   Charges:   PT Evaluation $PT Eval Moderate Complexity: 1 Procedure PT Treatments $Therapeutic Activity: 8-22 mins   PT G Codes:        Lasya Vetter B. Beverely Risen PT, DPT Acute Rehabilitation  [phone] Pager [phone]    Elon Alas [facility] 01/24/2017, 9:43 AM

## 2017-01-24 NOTE — Care Management Note (Signed)
Case Management Note  Patient Details  Name: Eric Oneill MRN: [id number] Date of Birth: 11-Sep-1927  Subjective/Objective:   Sepsis s/t asp PNA, UTI, Chronic Dysphagia                 Action/Plan: Discharge Planning: Chart reviewed. CSW following for return back to SNF when stable.     Expected Discharge Date:               Expected Discharge Plan:  Skilled Nursing Facility  In-House Referral:  Clinical Social Work  Discharge planning Services  CM Consult  Post Acute Care Choice:  NA Choice offered to:  NA  DME Arranged:  N/A DME Agency:  NA  HH Arranged:  NA HH Agency:  NA  Status of Service:  Completed, signed off  If discussed at Long Length of Stay Meetings, dates discussed:    Additional Comments:  Elliot CousinShavis, Debria Broecker Ellen, RN 01/24/2017, 5:44 PM

## 2017-01-24 NOTE — Consult Note (Signed)
WOC Nurse wound consult note Reason for Consult: Stage 1 Pressure Injury Wound type: area blanchable so I think this is more related to MASD (moisture associated skin damage) Pressure Injury POA: No Wound bed: intact, red skin  Drainage (amount, consistency, odor) none Periwound: intact  Dressing procedure/placement/frequency: Continue soft silicone foam to protect area. Low air loss mattress in place for moisture management.  Discussed POC with patient and bedside nurse.  Re consult if needed, will not follow at this time. Thanks  Donnette Macmullen M.D.C. Holdingsustin MSN, RN,CWOCN, CNS, CWON-AP [phone])

## 2017-01-24 NOTE — Progress Notes (Signed)
Initial Nutrition Assessment  DOCUMENTATION CODES:   Not applicable  INTERVENTION:    Continue Ensure Enlive po BID, each supplement provides 350 kcal and 20 grams of protein  NUTRITION DIAGNOSIS:   Inadequate oral intake related to dysphagia as evidenced by meal completion < 25%.  GOAL:   Other (Comment) (depends on Palliative Care discussion with family & patient)  MONITOR:   PO intake, Supplement acceptance, Skin, I & O's  REASON FOR ASSESSMENT:   Consult Assessment of nutrition requirement/status  ASSESSMENT:   83 yo male with PMH of Parkinson's disease, CAD, basal ganglia hemorrhage, R hemiparesis, HTN, HLD who was admitted on 6/26 with SOB.   Unable to speak with patient. Palliative Care meeting in progress. Unable to complete Nutrition-Focused physical exam at this time.  No recent weight available. Per review of chart, family's top priority is comfort. He is receiving full liquids for comfort feedings; family accepts the risk of aspiration. Labs and medications reviewed.  Diet Order:  Diet full liquid Room service appropriate? Yes; Fluid consistency: Thin  Skin:  Wound (see comment) (stage 1 to sacrum)  Last BM:  PTA  Height:   Ht Readings from Last 1 Encounters:  01/23/17 5\' 9"  (1.753 m)    Weight:   Wt Readings from Last 1 Encounters:  01/23/17 154 lb 1.6 oz (69.9 kg)    Ideal Body Weight:  72.7 kg  BMI:  Body mass index is 22.76 kg/m.  Estimated Nutritional Needs:   Kcal:  1900-2100  Protein:  90-105 gm  Fluid:  2 L  EDUCATION NEEDS:   No education needs identified at this time  Joaquin CourtsKimberly Harris, RD, LDN, CNSC Pager [phone] After Hours Pager [phone]

## 2017-01-25 DIAGNOSIS — E43 Unspecified severe protein-calorie malnutrition: Secondary | ICD-10-CM

## 2017-01-25 DIAGNOSIS — Z7189 Other specified counseling: Secondary | ICD-10-CM

## 2017-01-25 DIAGNOSIS — Z515 Encounter for palliative care: Secondary | ICD-10-CM

## 2017-01-25 DIAGNOSIS — G20 Parkinson's disease: Secondary | ICD-10-CM

## 2017-01-25 LAB — CBC
HEMATOCRIT: 28.5 % — AB (ref 39.0–52.0)
Hemoglobin: 8.7 g/dL — ABNORMAL LOW (ref 13.0–17.0)
MCH: 21.7 pg — AB (ref 26.0–34.0)
MCHC: 30.5 g/dL (ref 30.0–36.0)
MCV: 71.1 fL — AB (ref 78.0–100.0)
PLATELETS: 449 10*3/uL — AB (ref 150–400)
RBC: 4.01 MIL/uL — ABNORMAL LOW (ref 4.22–5.81)
RDW: 19.4 % — AB (ref 11.5–15.5)
WBC: 16.9 10*3/uL — AB (ref 4.0–10.5)

## 2017-01-25 LAB — BASIC METABOLIC PANEL
ANION GAP: 8 (ref 5–15)
BUN: 23 mg/dL — AB (ref 6–20)
CALCIUM: 8.8 mg/dL — AB (ref 8.9–10.3)
CO2: 19 mmol/L — AB (ref 22–32)
Chloride: 111 mmol/L (ref 101–111)
Creatinine, Ser: 0.91 mg/dL (ref 0.61–1.24)
GFR calc Af Amer: >60 mL/min (ref >60)
Glucose, Bld: 85 mg/dL (ref 65–99)
Potassium: 3.3 mmol/L — ABNORMAL LOW (ref 3.5–5.1)
Sodium: 138 mmol/L (ref 135–145)

## 2017-01-25 MED ORDER — HALOPERIDOL LACTATE 5 MG/ML IJ SOLN: 0.5000 mg | INTRAMUSCULAR | Status: DC | PRN

## 2017-01-25 MED ORDER — HALOPERIDOL LACTATE 2 MG/ML PO CONC: 0.5000 mg | ORAL | Status: DC | PRN

## 2017-01-25 MED ORDER — BIOTENE DRY MOUTH MT LIQD: 15.0000 mL | OROMUCOSAL | Status: DC | PRN

## 2017-01-25 MED ORDER — GLYCOPYRROLATE 0.2 MG/ML IJ SOLN: 0.2000 mg | INTRAMUSCULAR | Status: DC | PRN

## 2017-01-25 MED ORDER — MORPHINE SULFATE (CONCENTRATE) 10 MG/0.5ML PO SOLN
10.0000 mg | ORAL | Status: DC | PRN
  Administered 2017-01-25: 10 mg via ORAL
  Filled 2017-01-25: qty 0.5

## 2017-01-25 MED ORDER — HALOPERIDOL 0.5 MG PO TABS: 0.5000 mg | ORAL_TABLET | ORAL | Status: DC | PRN

## 2017-01-25 MED ORDER — GLYCOPYRROLATE 1 MG PO TABS: 1.0000 mg | ORAL_TABLET | ORAL | Status: DC | PRN

## 2017-01-25 MED ORDER — GLYCOPYRROLATE 1 MG PO TABS: 1.0000 mg | ORAL_TABLET | ORAL | Status: AC | PRN

## 2017-01-25 MED ORDER — HALOPERIDOL LACTATE 2 MG/ML PO CONC: 0.6000 mg | ORAL | Status: AC | PRN

## 2017-01-25 MED ORDER — MORPHINE SULFATE (PF) 2 MG/ML IV SOLN
2.0000 mg | INTRAVENOUS | Status: DC | PRN
  Filled 2017-01-25: qty 1

## 2017-01-25 MED ORDER — IPRATROPIUM-ALBUTEROL 0.5-2.5 (3) MG/3ML IN SOLN: 3.0000 mL | RESPIRATORY_TRACT | Status: DC | PRN

## 2017-01-25 NOTE — Discharge Summary (Addendum)
Physician Discharge Summary  Eric Oneill ZOX:[id number] DOB: 1927/08/02 DOA: 01/23/2017  PCP: Eric Oneill, Jerry, MD  Admit date: 01/23/2017 Discharge date: 01/25/2017  Time spent: 35 minutes  Recommendations for Outpatient Follow-up:  1. Full comfort care.  Discharge Diagnoses:  Active Problems:   Hypertension   Hemiparesis affecting right side as late effect of cerebrovascular accident (HCC)   Sepsis (HCC)   CAD (coronary artery disease)   HCAP (healthcare-associated pneumonia)   Acute respiratory failure with hypoxia (HCC)   Aspiration pneumonia (HCC)   Bacteriuria with pyuria   Sacrum Pressure injury of skin stage 1 due to MASD   Parkinson's disease [facility](HCC)   Palliative care encounter   Encounter for hospice care discussion   Discharge Condition: stable and in no major distress. [facility] for [facility] management and end of life care.  Diet recommendation: comfort feeding   Filed Weights   01/23/17 1631 01/23/17 2238  Weight: 82.6 kg (182 lb) 69.9 kg (154 lb 1.6 oz)    Brief History of present illness:  83 y.o.malewith medical history significant of Parkinson's disease, CAD, basal ganglia hemorrhage in 2014, right hemiparesis, dysarthria, HTN, HLD, patient presents with respiratory distress, found to be in acute hypoxic respiratory failure secondary to pneumonia, requiring BiPAP initially. There also concerns for UTI, patient with chronic foley.  Hospital Course:  Sepsis due to aspiration PNA -secondary to parkinson disease -after discussion with patient and family; GOC meeting decided to pursuit full comfort care only -patient on 4L [place]; no major distress -antibiotics discontinued -[facility] and end of life care.  Presumed UTI -patient with chronic foley and a high risk for infection  -patient received vanc and zosyn initially; no final culture available at discharge -patient denies abd  pain and dysuria  -no further antibiotics -plan is to pursuit full comfort care  Pressure injury on his sacrum - stage 1 and associated to MASD -present on admission.   Chronic dysphagia -secondary to parkinson  -will pursuit full comfort care -comfort feeding to be provided; family understand and in agreement on risk for aspiration  Parkinson disease -continue sinemet  Hx of atrial fibrillation  -patient is not on anticoagulation due to persistent hematuria and risk of falling -rate is controlled -plans are for full comfort care  Severe protein calorie malnutrition  -comfort care and comfort feeding   End of life care -[facility] management and end of life care.  Blood isolation of Staphylococcus aureus: coagulase neg -1/2; most likely contaminant Plan is for full comfort care -no antibiotics will be provided  Procedures:  See below for x-ray reports   Consultations:  Palliative care  Discharge Exam: Vitals:   01/25/17 1300 01/25/17 1400  BP:    Pulse: (!) 28   Resp: (!) 21 (!) 25  Temp:      General: frail, chronically ill in appearance, afebrile, denies CP and endorses breathing ok.  Cardiovascular: RRR, no rubs, no gallops, positive systolic ejection murmur  Respiratory: no wheezing, no using accessory muscles, wearing 4L [place] Abd: soft, +BS, no tenderness, no distension  Extremities: no cyanosis, no clubbing  Neuro: right side weakness (per family at baseline); able to follow simple commands; not talking.  Discharge Instructions   Discharge Instructions    Discharge instructions    Complete by:  As directed    Full comfort care     Current Discharge Medication List    START  taking these medications   Details  glycopyrrolate (ROBINUL) 1 MG tablet Take 1 tablet (1 mg total) by mouth every 4 (four) hours as needed (excessive secretions).    haloperidol (HALDOL) 2 MG/ML solution Place 0.3 mLs (0.6 mg total)  under the tongue every 4 (four) hours as needed for agitation (or delirium).      CONTINUE these medications which have NOT CHANGED   Details  acetaminophen (TYLENOL) 325 MG tablet Take 650 mg by mouth every 6 (six) hours as needed for fever.     Artificial Tear Ointment (AKWA TEARS) 09-14-81 % OINT Place 1 application into both eyes 2 (two) times daily.    carbidopa-levodopa (SINEMET CR) 50-200 MG per tablet Take 1 tablet by mouth at bedtime.    carbidopa-levodopa (SINEMET IR) 25-100 MG per tablet Take 1.5-2 tablets by mouth See admin instructions. Take 2 tablets by mouth at 7 am, 1.5 tablets by mouth at 11 am, 1.5 tablets by mouth at 3 pm, 1.5 tablets by mouth at 7 pm    cetirizine (ZYRTEC ALLERGY) 10 MG tablet Take 1 tablet (10 mg total) by mouth daily. Qty: 30 tablet, Refills: 5    Hypromellose (ARTIFICIAL TEARS OP) Place 1 drop into both eyes 4 (four) times daily.    ipratropium-albuterol (DUONEB) 0.5-2.5 (3) MG/3ML SOLN Take 3 mLs by nebulization See admin instructions. Use 1 vial via nebulizer at bedtime. Use 1 vial via nebulizer every 6 hours as needed for shortness of breath or wheezing    mirtazapine (REMERON) 15 MG tablet Take 15 mg by mouth at bedtime.      STOP taking these medications     docusate (COLACE) 50 MG/5ML liquid      escitalopram (LEXAPRO) 10 MG tablet      guaiFENesin (MUCINEX) 600 MG 12 hr tablet      guaifenesin (ROBITUSSIN) 100 MG/5ML syrup      montelukast (SINGULAIR) 10 MG tablet      polyethylene glycol (MIRALAX / GLYCOLAX) packet      tamsulosin (FLOMAX) 0.4 MG CAPS capsule        Allergies  Allergen Reactions  . Ativan [Lorazepam] Other (See Comments)    Unknown  . Ivp Dye [Iodinated Diagnostic Agents] Other (See Comments)    Dizziness      The results of significant diagnostics from this hospitalization (including imaging, microbiology, ancillary and laboratory) are listed below for reference.    Significant Diagnostic  Studies: Dg Chest Portable 1 View  Result Date: 01/23/2017 CLINICAL DATA:  Shortness of breath EXAM: PORTABLE CHEST 1 VIEW COMPARISON:  Portable chest x-ray of 01/08/2016 FINDINGS: There is haziness at the left lung base which could represent atelectasis, pneumonia, and/or small left effusion. The right lung is clear. Mediastinal and hilar contours are unremarkable. Cardiomegaly is stable. Median sternotomy sutures are noted from prior CABG. IMPRESSION: Haziness at the left lung base may indicate atelectasis, pneumonia, and possibly small left pleural effusion. Recommend two-view chest x-ray if possible. Electronically Signed   By: Dwyane Dee M.D.   On: 01/23/2017 16:50    Microbiology: Recent Results (from the past 240 hour(s))  Blood Culture (routine x 2)     Status: None (Preliminary result)   Collection Time: 01/23/17  2:44 PM  Result Value Ref Range Status   Specimen Description BLOOD RIGHT FOREARM  Final   Special Requests   Final    BOTTLES DRAWN AEROBIC AND ANAEROBIC Blood Culture adequate volume   Culture NO GROWTH < 24 HOURS  Final   Report Status PENDING  Incomplete  Blood Culture (routine x 2)     Status: Abnormal (Preliminary result)   Collection Time: 01/23/17  4:54 PM  Result Value Ref Range Status   Specimen Description BLOOD LEFT ANTECUBITAL  Final   Special Requests   Final    BOTTLES DRAWN AEROBIC AND ANAEROBIC Blood Culture adequate volume   Culture  Setup Time   Final    GRAM POSITIVE COCCI IN CLUSTERS AEROBIC BOTTLE ONLY CRITICAL RESULT CALLED TO, READ BACK BY AND VERIFIED WITH: EDaphine Deutscher, PHARMD 1428 01/24/2017 T. TYSOR    Culture STAPHYLOCOCCUS SPECIES (COAGULASE NEGATIVE) (A)  Final   Report Status PENDING  Incomplete  Blood Culture ID Panel (Reflexed)     Status: Abnormal   Collection Time: 01/23/17  4:54 PM  Result Value Ref Range Status   Enterococcus species NOT DETECTED NOT DETECTED Final   Listeria monocytogenes NOT DETECTED NOT DETECTED Final    Staphylococcus species DETECTED (A) NOT DETECTED Final    Comment: Methicillin (oxacillin) susceptible coagulase negative staphylococcus. Possible blood culture contaminant (unless isolated from more than one blood culture draw or clinical case suggests pathogenicity). No antibiotic treatment is indicated for blood  culture contaminants. CRITICAL RESULT CALLED TO, READ BACK BY AND VERIFIED WITH: E. MARTIN, PHARMD 1428 01/24/2017 T. TYSOR    Staphylococcus aureus NOT DETECTED NOT DETECTED Final   Methicillin resistance NOT DETECTED NOT DETECTED Final   Streptococcus species NOT DETECTED NOT DETECTED Final   Streptococcus agalactiae NOT DETECTED NOT DETECTED Final   Streptococcus pneumoniae NOT DETECTED NOT DETECTED Final   Streptococcus pyogenes NOT DETECTED NOT DETECTED Final   Acinetobacter baumannii NOT DETECTED NOT DETECTED Final   Enterobacteriaceae species NOT DETECTED NOT DETECTED Final   Enterobacter cloacae complex NOT DETECTED NOT DETECTED Final   Escherichia coli NOT DETECTED NOT DETECTED Final   Klebsiella oxytoca NOT DETECTED NOT DETECTED Final   Klebsiella pneumoniae NOT DETECTED NOT DETECTED Final   Proteus species NOT DETECTED NOT DETECTED Final   Serratia marcescens NOT DETECTED NOT DETECTED Final   Haemophilus influenzae NOT DETECTED NOT DETECTED Final   Neisseria meningitidis NOT DETECTED NOT DETECTED Final   Pseudomonas aeruginosa NOT DETECTED NOT DETECTED Final   Candida albicans NOT DETECTED NOT DETECTED Final   Candida glabrata NOT DETECTED NOT DETECTED Final   Candida krusei NOT DETECTED NOT DETECTED Final   Candida parapsilosis NOT DETECTED NOT DETECTED Final   Candida tropicalis NOT DETECTED NOT DETECTED Final  Urine culture     Status: Abnormal (Preliminary result)   Collection Time: 01/23/17  7:10 PM  Result Value Ref Range Status   Specimen Description URINE, RANDOM  Final   Special Requests NONE  Final   Culture (A)  Final    60,000 COLONIES/mL  STAPHYLOCOCCUS AUREUS SUSCEPTIBILITIES TO FOLLOW    Report Status PENDING  Incomplete  MRSA PCR Screening     Status: Abnormal   Collection Time: 01/23/17 11:45 PM  Result Value Ref Range Status   MRSA by PCR POSITIVE (A) NEGATIVE Final    Comment:        The GeneXpert MRSA Assay (FDA approved for NASAL specimens only), is one component of a comprehensive MRSA colonization surveillance program. It is not intended to diagnose MRSA infection nor to guide or monitor treatment for MRSA infections. RESULT CALLED TO, READ BACK BY AND VERIFIED WITH: K.PHILLIPS,RN AT 0212 BY L.PITT 01/24/17      Labs: Basic Metabolic Panel:  Recent Labs  Lab 01/23/17 1641 01/24/17 0225 01/25/17 0218  NA 137 138 138  K 4.2 3.7 3.3*  CL 106 109 111  CO2 21* 22 19*  GLUCOSE 137* 141* 85  BUN 31* 30* 23*  CREATININE 1.22 1.11 0.91  CALCIUM 9.6 8.9 8.8*  MG  --  1.9  --   PHOS  --  3.2  --    Liver Function Tests:  Recent Labs Lab 01/23/17 1641 01/24/17 0225  AST 25 17  ALT <5* <5*  ALKPHOS 101 77  BILITOT 0.9 1.0  PROT 7.6 6.4*  ALBUMIN 2.7* 2.3*   CBC:  Recent Labs Lab 01/23/17 1641 01/24/17 0225 01/25/17 0218  WBC 20.6* 24.4* 16.9*  NEUTROABS 18.2*  --   --   HGB 10.0* 8.5* 8.7*  HCT 33.2* 28.1* 28.5*  MCV 71.6* 71.7* 71.1*  PLT 556* 505* 449*     Signed:  Vassie Loll MD.  [facility] 01/25/2017, 2:23 PM

## 2017-01-25 NOTE — Consult Note (Signed)
Consultation Note Date: 01/25/2017   Patient Name: Eric Oneill  DOB: [birth year]  MRN: [id number]  Age / Sex: 83 y.o., male  PCP: Willodean Rosenthal, MD Referring Physician: Barton Dubois, MD  Reason for Consultation: Establishing goals of care  HPI/Patient Profile: 83 y.o. 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  He has not bounced back as hoped.  He is eating and drinking very little.  He continues to aspirate.  He is on an increased amount of oxygen.   Clinical Assessment and Goals of Care: I have reviewed medical records including EPIC notes, labs and imaging, received report from the care team, assessed the patient and then met at the bedside along with his 3 dtrs Eric Oneill, Eric Oneill and Eric Oneill)  to discuss diagnosis prognosis, GOC, EOL wishes, disposition and options.  I introduced Palliative Medicine as specialized medical care for people living with serious illness. It focuses on providing relief from the symptoms and stress of a serious illness. The goal is to improve quality of life for both the patient and the family.  We discussed a brief life review of the patient. He had a career with [employer] in [place], [place], and then [place].  He lost his wife some years ago and has been cared for by his daughter Eric Oneill Education officer, community) at [facility].  He is a Advertising account executive and derives a great deal of comfort and peace from his priest at [facility]  I attempted to elicit values and goals of care important to the patient.  Per his daughters he does not have quality of life.  After his last significant illness he decided that he did not want to continue to push to live.  He asked his daughters to allow him to pass on with his next illness.  Consequently, they are honoring his  wishes.  We had a private conversation in which the girls asked about prognosis and concepts specific to comfort and hospice house.  Then we returned to the room and the girls asked their father about going to [facility].  Mr. Bogert had chosen [facility] for his wife when she was dying so he is acquainted with it - even though his wife died the night before going.  Mr. Guerrette indicated to his daughters that, Yes he did want to go to hospice house.    Eric Oneill offered to contact [place] for Mr. Bonczek priest to come and give him the [place].  This seemed to comfort Mr. Borquez.  Questions and concerns were addressed.  The family was encouraged to call with questions or concerns.  PMT will continue to support holistically.  Primary Decision Maker:  HCPOA Eric Oneill    SUMMARY OF RECOMMENDATIONS    Code Status/Advance Care Planning:  DNR    Symptom Management:   NO ATIVAN  Morphine for pain, dyspnea  Continue Sinement  Additional Recommendations (Limitations, Scope,  Preferences):  Full Comfort Care  Palliative Prophylaxis:   Aspiration and Eye Care  Psycho-social/Spiritual:   Desire for further Chaplaincy support:  Personal priest  Prognosis:   < 2 weeks given end stage parkinsons, CVA, minimal PO intake, recurrent aspiration   Discharge Planning: Hospice facility [facility] is specifically requested.      Primary Diagnoses: Present on Admission: . Hypertension . Sepsis ([place]) . CAD (coronary artery disease) . HCAP (healthcare-associated pneumonia) . Acute respiratory failure with hypoxia ([place]) . Aspiration pneumonia ([place]) . Bacteriuria with pyuria   I have reviewed the medical record, interviewed the patient and family, and examined the patient. The following aspects are pertinent.  Past Medical History:  Diagnosis Date  . Coronary artery disease   . Depression   . Parkinson disease ([place])    dx: 06/2007  . Stroke [facility])    05-2013   Social History    Social History  . Marital status: Widowed    Spouse name: N/A  . Number of children: N/A  . Years of education: N/A   Occupational History  . retired     [employer]   Social History Main Topics  . Smoking status: Former Smoker    Quit date: 02/18/1958  . Smokeless tobacco: Never Used  . Alcohol use Yes     Comment: daily, glass wine daily  . Drug use: No  . Sexual activity: Not Asked   Other Topics Concern  . None   Social History Narrative  . None   Family History  Problem Relation Age of Onset  . Stroke Father   . Cancer Sister        breast  . Cancer Brother        myeloma   Scheduled Meds: . carbidopa-levodopa  1 tablet Oral QHS  . carbidopa-levodopa  2 tablet Oral [id number]   And  . carbidopa-levodopa  1.5 tablet Oral TID  . Chlorhexidine Gluconate Cloth  6 each Topical Q0600  . feeding supplement (ENSURE ENLIVE)  237 mL Oral BID BM  . mouth rinse  15 mL Mouth Rinse BID  . mirtazapine  15 mg Oral QHS  . mupirocin ointment  1 application Nasal BID  . sodium chloride flush  3 mL Intravenous Q12H   Continuous Infusions: PRN Meds:.acetaminophen **OR** acetaminophen, albuterol, antiseptic oral rinse, glycopyrrolate **OR** glycopyrrolate **OR** glycopyrrolate, haloperidol **OR** haloperidol **OR** haloperidol lactate, ipratropium-albuterol, morphine injection, ondansetron **OR** ondansetron (ZOFRAN) IV, polyvinyl alcohol Allergies  Allergen Reactions  . Ativan [Lorazepam] Other (See Comments)    Unknown  . Ivp Dye [Iodinated Diagnostic Agents] Other (See Comments)    Dizziness    Review of Systems patient unable  Physical Exam  Thin frail, elderly gentleman.  Limited facial expressions CV rrr with 3/6 murmur Resp on 4L n/c Abdomen thin, soft, +BS, +PEG Right sided weakness  Vital Signs: BP (!) 109/48 (BP Location: Left Arm)   Pulse 72   Temp 98 F (36.7 C) (Axillary)   Resp (!) 21   Ht 5' 9"  (1.753 m)   Wt 69.9 kg (154 lb 1.6 oz)   SpO2 (!) 64%    BMI 22.76 kg/m  Pain Assessment: PAINAD   Pain Score: Asleep   SpO2: SpO2: (!) 64 % O2 Device:SpO2: (!) 64 % O2 Flow Rate: .O2 Flow Rate (L/min): 3 L/min  IO: Intake/output summary:   Intake/Output Summary (Last 24 hours) at 01/25/17 1104 Last data filed at 01/25/17 0700  Gross per 24 hour  Intake  263 ml  Output              525 ml  Net             -262 ml    LBM: Last BM Date: 01/24/17 Baseline Weight: Weight: 82.6 kg (182 lb) Most recent weight: Weight: 69.9 kg (154 lb 1.6 oz)     Palliative Assessment/Data:   Flowsheet Rows     Most Recent Value  Intake Tab  Referral Department  Hospitalist  Unit at Time of Referral  Intermediate Care Unit  Palliative Care Primary Diagnosis  Neurology  Date Notified  01/23/17  Palliative Care Type  New Palliative care  Reason for referral  Clarify Goals of Care  Date of Admission  01/23/17  Date first seen by Palliative Care  01/25/17  # of days Palliative referral response time  2 Day(s)  # of days IP prior to Palliative referral  0  Clinical Assessment  Palliative Performance Scale Score  20%  Pain Max last 24 hours  7  Pain Min Last 24 hours  3  Psychosocial & Spiritual Assessment  Palliative Care Outcomes  Patient/Family meeting held?  Yes  Who was at the meeting?  three daughters and patient  Palliative Care Outcomes  Clarified goals of care, Transitioned to hospice, Changed to focus on comfort      Time In: 9:00 Time Out: 10:30 Time Total: 90 min. Greater than 50%  of this time was spent counseling and coordinating care related to the above assessment and plan.  Signed by: Imogene Burn, PA-C Palliative Medicine Pager: [phone]  Please contact Palliative Medicine Team phone at [phone] for questions and concerns.  For individual provider: See Shea Evans

## 2017-01-25 NOTE — Progress Notes (Signed)
Pt daughters tearful at bedside, monitor constantly beeping in pt room. Daughters requested monitor alarms to be turned off. MD paged. MD doesn't want monitors turned off at this time due to palliative care meeting in the morning. MD agreed that RN may place monitor on comfort mode only if RN is still able to receive alerts and see monitor outside of pt room. RN called tele monitoring to verify alerts would still beep for RN to hear. Verification received, monitor now on comfort mode per family request. Will continue to monitor.

## 2017-01-25 NOTE — Clinical Social Work Note (Signed)
CSW facilitated patient discharge including contacting patient family and facility to confirm patient discharge plans. Clinical information faxed to facility and family agreeable with plan. CSW arranged [facility]. RN is currently calling report.  CSW will sign off for now as social work intervention is no longer needed. Please consult us again if new needs arise.  Charlynn CourtSarah Kelci Petrella, CSW [phone]

## 2017-01-25 NOTE — Clinical Social Work Note (Signed)
Clinical Social Work Assessment  Patient Details  Name: Eric Oneill MRN: [id number] Date of Birth: 11-26-1927  Date of referral:  01/25/17               Reason for consult:  Facility Placement, End of Life/Hospice, Discharge Planning                Permission sought to share information with:  Facility Sport and exercise psychologist, Family Supports Permission granted to share information::  Yes, Verbal Permission Granted  Name::     Ulyess Mort  Agency::  [facility]  Relationship::  Daughter  Contact Information:  [phone]  Housing/Transportation Living arrangements for the past 2 months:  [place] of Information:  Medical Team, Adult Children, Facility Patient Interpreter Needed:  None Criminal Activity/Legal Involvement Pertinent to Current Situation/Hospitalization:  No - Comment as needed Significant Relationships:  Adult Children Lives with:  Facility Resident Do you feel safe going back to the place where you live?  No Need for family participation in patient care:  Yes (Comment)  Care giving concerns:  Palliative recommending residential hospice.   Social Worker assessment / plan:  CSW and MD met with patient. Family at bedside. Patient oriented to self only. MD discussed plan with family. First preference hospice facility is [facility]. Referral made to [facility]. They do have a bed available today. No further concerns. CSW encouraged patient's family to contact CSW as needed. CSW will continue to follow patient and his family for support and facilitate discharge to [facility] likely today.  Employment status:  Retired Nurse, adult PT Recommendations:  [place] / Referral to community resources:  Other (Comment Required) (Residential hospice)  Patient/Family's Response to care:  Patient oriented to self only. Patient's family agreeable to [facility]. Patient's family supportive and involved  in patient's care. Patient's family appreciated social work intervention.  Patient/Family's Understanding of and Emotional Response to Diagnosis, Current Treatment, and Prognosis:  Patient oriented to self only. Patient's family has a good understanding of the reason for admission and prognosis. Patient's family appear happy with hospital care.  Emotional Assessment Appearance:  Appears stated age Attitude/Demeanor/Rapport:  Unable to Assess Affect (typically observed):  Unable to Assess Orientation:  Oriented to Self Alcohol / Substance use:  Never Used Psych involvement (Current and /or in the community):  No (Comment)  Discharge Needs  Concerns to be addressed:  Care Coordination Readmission within the last 30 days:  No Current discharge risk:  Cognitively Impaired, Dependent with Mobility, Terminally ill Barriers to Discharge:  No Barriers Identified   Candie Chroman, LCSW 01/25/2017, 11:48 AM

## 2017-01-25 NOTE — Plan of Care (Signed)
Problem: Skin Integrity: Goal: Risk for impaired skin integrity will decrease Outcome: Progressing Patient has foam applied to bottom for MASD. Pt offered to be turned every two hours for comfort.

## 2017-01-25 NOTE — Progress Notes (Addendum)
Received request from [facility] for family interest in [facility] with request to transfer today. Chart reviewed and report received from [place], [place] with PMT. Met with patient and daughters Donnajean Lopes and Lovey Newcomer [facility]) to confirm interest and explain services. Judson Roch, [place] aware. Registration paperwork completed. Dr. Orpah Melter to assume care per family request.   Please fax discharge summary [phone].  RN please call report to [phone].  Please arrange for transport for patient to arrive at [facility] as soon as possible.  Thank you,  Margaretmary Eddy, [facility] Liaison [phone]  [place] are on AMION.

## 2017-01-26 LAB — CULTURE, BLOOD (ROUTINE X 2): Special Requests: ADEQUATE

## 2017-01-27 LAB — URINE CULTURE

## 2017-01-28 LAB — CULTURE, BLOOD (ROUTINE X 2)
Culture: NO GROWTH
Special Requests: ADEQUATE

## 2017-02-28 DEATH — deceased

## 2017-06-25 IMAGING — CR DG ABDOMEN 1V
2 series · 2 of 2 positions shown · non-contrast
Comparison: CT abdomen and pelvis 07/12/2015.

CLINICAL DATA: RIGHT kidney stone.  RIGHT abdominal pain.

EXAM:
ABDOMEN - 1 VIEW

[t abdomen supine (1 of 2)]
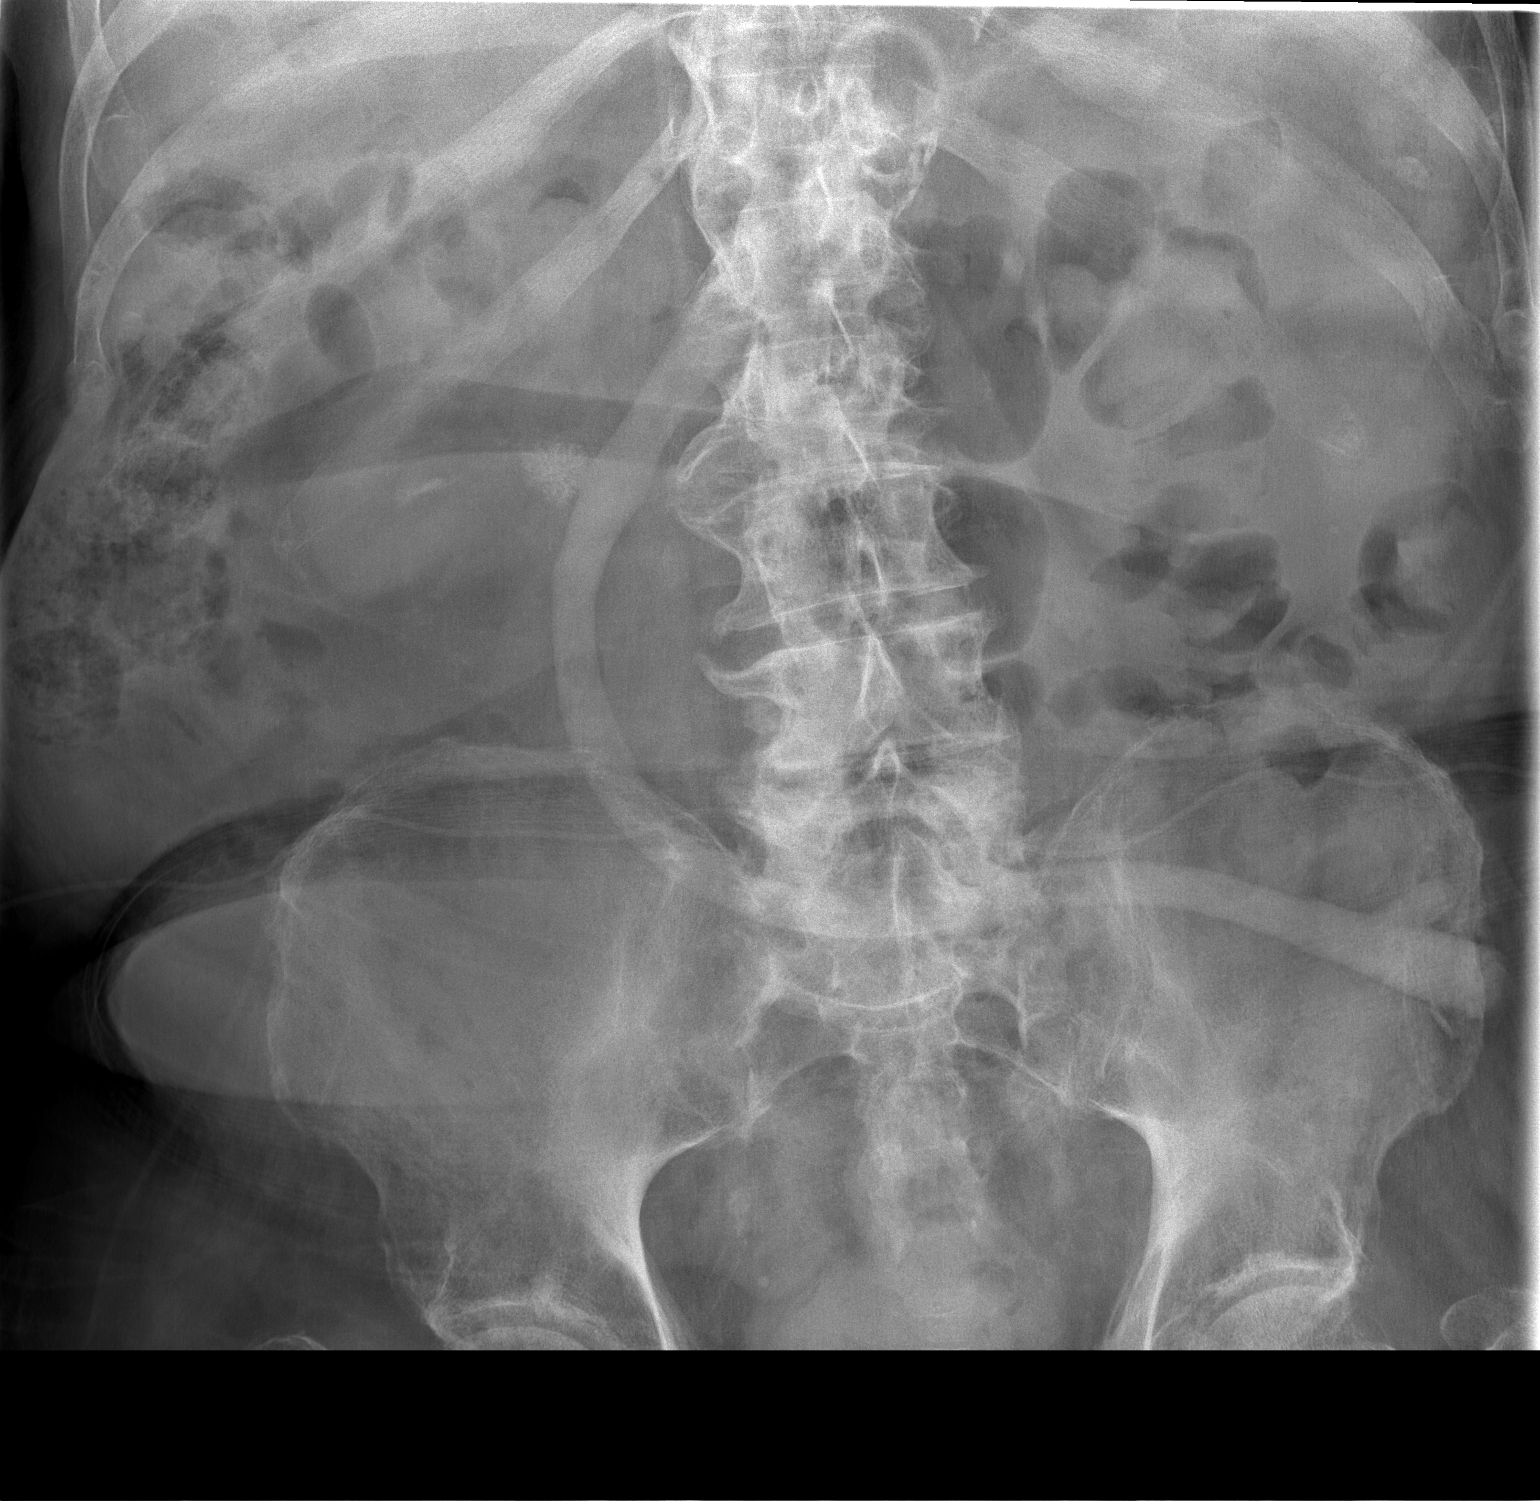

[t abdomen supine (2 of 2)]
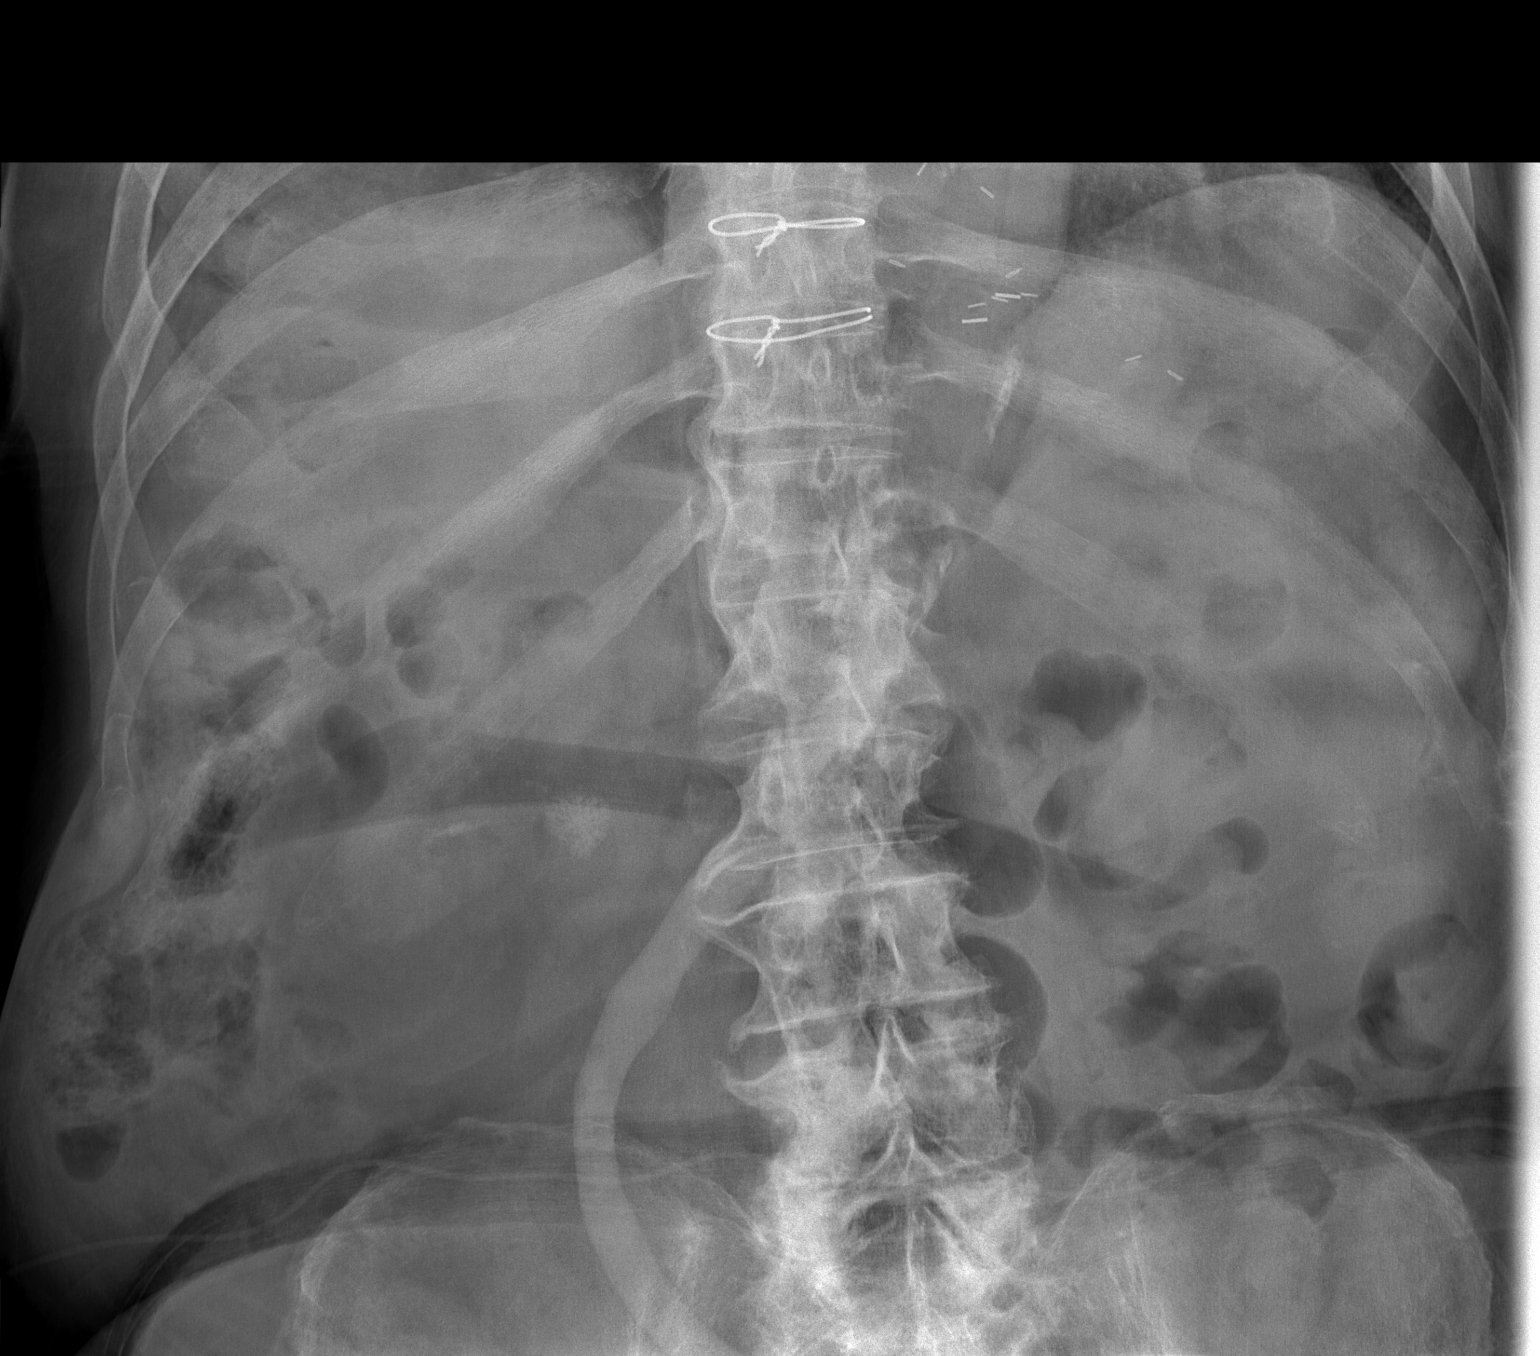

[2 of 2 positions shown; findings below may reference images not displayed]

FINDINGS: Multiple RIGHT renal and UPJ calculi are redemonstrated, similar to
prior CT. The largest stone, suspected to lie at the UPJ, 15 mm
diameter. Nonobstructive gas pattern. Degenerative change lumbar
spine.
IMPRESSION: Suspected RIGHT renal and UPJ calculi.
